# Patient Record
Sex: Male | Born: 1950 | Race: White | Hispanic: No | Smoking: Current every day smoker
Health system: Southern US, Community
[De-identification: ages and names within clinical notes are randomized; demographics above are authoritative.]

## PROBLEM LIST (undated history)

## (undated) DIAGNOSIS — Z87442 Personal history of urinary calculi: Secondary | ICD-10-CM

## (undated) DIAGNOSIS — C801 Malignant (primary) neoplasm, unspecified: Secondary | ICD-10-CM

## (undated) DIAGNOSIS — I10 Essential (primary) hypertension: Secondary | ICD-10-CM

## (undated) DIAGNOSIS — J189 Pneumonia, unspecified organism: Secondary | ICD-10-CM

## (undated) DIAGNOSIS — I739 Peripheral vascular disease, unspecified: Secondary | ICD-10-CM

## (undated) DIAGNOSIS — R0600 Dyspnea, unspecified: Secondary | ICD-10-CM

## (undated) DIAGNOSIS — M1990 Unspecified osteoarthritis, unspecified site: Secondary | ICD-10-CM

## (undated) DIAGNOSIS — S0990XA Unspecified injury of head, initial encounter: Secondary | ICD-10-CM

## (undated) DIAGNOSIS — E7800 Pure hypercholesterolemia, unspecified: Secondary | ICD-10-CM

## (undated) DIAGNOSIS — I509 Heart failure, unspecified: Secondary | ICD-10-CM

## (undated) DIAGNOSIS — S32599A Other specified fracture of unspecified pubis, initial encounter for closed fracture: Secondary | ICD-10-CM

## (undated) HISTORY — DX: Peripheral vascular disease, unspecified: I73.9

## (undated) HISTORY — DX: Unspecified osteoarthritis, unspecified site: M19.90

## (undated) HISTORY — PX: HERNIA REPAIR: SHX51

## (undated) HISTORY — PX: LAPAROSCOPIC ASSISTED VENTRAL HERNIA REPAIR: SHX6312

## (undated) HISTORY — PX: BACK SURGERY: SHX140

## (undated) HISTORY — DX: Heart failure, unspecified: I50.9

## (undated) HISTORY — PX: LUMBAR DISC SURGERY: SHX700

## (undated) HISTORY — PX: CYSTOSCOPY W/ STONE MANIPULATION: SHX1427

## (undated) HISTORY — PX: BREAST SURGERY: SHX581

## (undated) HISTORY — PX: POSTERIOR LUMBAR FUSION: SHX6036

## (undated) HISTORY — DX: Dyspnea, unspecified: R06.00

## (undated) HISTORY — PX: LUMBAR SPINE HARDWARE REMOVAL: SHX1987

---

## 1995-09-22 HISTORY — PX: COLONOSCOPY W/ POLYPECTOMY: SHX1380

## 1998-12-26 ENCOUNTER — Observation Stay (HOSPITAL_COMMUNITY): Admission: RE | Admit: 1998-12-26 | Discharge: 1998-12-27 | Payer: Self-pay | Admitting: Neurosurgery

## 1999-06-10 ENCOUNTER — Ambulatory Visit (HOSPITAL_COMMUNITY): Admission: RE | Admit: 1999-06-10 | Discharge: 1999-06-10 | Payer: Self-pay | Admitting: Neurosurgery

## 2000-02-20 DIAGNOSIS — S32599A Other specified fracture of unspecified pubis, initial encounter for closed fracture: Secondary | ICD-10-CM

## 2000-02-20 HISTORY — DX: Other specified fracture of unspecified pubis, initial encounter for closed fracture: S32.599A

## 2002-07-14 ENCOUNTER — Encounter: Payer: Self-pay | Admitting: *Deleted

## 2002-07-14 ENCOUNTER — Emergency Department (HOSPITAL_COMMUNITY): Admission: EM | Admit: 2002-07-14 | Discharge: 2002-07-14 | Payer: Self-pay | Admitting: *Deleted

## 2009-11-22 ENCOUNTER — Observation Stay (HOSPITAL_COMMUNITY): Admission: EM | Admit: 2009-11-22 | Discharge: 2009-11-23 | Payer: Self-pay | Admitting: Emergency Medicine

## 2010-10-31 ENCOUNTER — Other Ambulatory Visit (HOSPITAL_COMMUNITY): Payer: Self-pay | Admitting: Internal Medicine

## 2010-10-31 ENCOUNTER — Ambulatory Visit (HOSPITAL_COMMUNITY)
Admission: RE | Admit: 2010-10-31 | Discharge: 2010-10-31 | Disposition: A | Discharge status: Home / Self Care | Payer: Medicare Other | Source: Ambulatory Visit | Attending: Internal Medicine | Admitting: Internal Medicine

## 2010-10-31 ENCOUNTER — Encounter (HOSPITAL_COMMUNITY): Payer: Self-pay

## 2010-10-31 DIAGNOSIS — F17200 Nicotine dependence, unspecified, uncomplicated: Secondary | ICD-10-CM | POA: Insufficient documentation

## 2010-10-31 DIAGNOSIS — R05 Cough: Secondary | ICD-10-CM | POA: Insufficient documentation

## 2010-10-31 DIAGNOSIS — Z720 Tobacco use: Secondary | ICD-10-CM

## 2010-10-31 DIAGNOSIS — J4489 Other specified chronic obstructive pulmonary disease: Secondary | ICD-10-CM | POA: Insufficient documentation

## 2010-10-31 DIAGNOSIS — M545 Low back pain, unspecified: Secondary | ICD-10-CM | POA: Insufficient documentation

## 2010-10-31 DIAGNOSIS — R059 Cough, unspecified: Secondary | ICD-10-CM | POA: Insufficient documentation

## 2010-10-31 DIAGNOSIS — M549 Dorsalgia, unspecified: Secondary | ICD-10-CM

## 2010-10-31 DIAGNOSIS — J449 Chronic obstructive pulmonary disease, unspecified: Secondary | ICD-10-CM | POA: Insufficient documentation

## 2010-10-31 HISTORY — DX: Essential (primary) hypertension: I10

## 2010-12-15 LAB — CBC
HCT: 47.7 % (ref 39.0–52.0)
Hemoglobin: 16.4 g/dL (ref 13.0–17.0)
RBC: 4.9 MIL/uL (ref 4.22–5.81)
RDW: 13.9 % (ref 11.5–15.5)

## 2010-12-15 LAB — URINALYSIS, ROUTINE W REFLEX MICROSCOPIC
Bilirubin Urine: NEGATIVE
Glucose, UA: NEGATIVE mg/dL
Ketones, ur: NEGATIVE mg/dL
Specific Gravity, Urine: 1.014 (ref 1.005–1.030)
pH: 6 (ref 5.0–8.0)

## 2010-12-15 LAB — POCT CARDIAC MARKERS
CKMB, poc: <1 ng/mL — ABNORMAL LOW (ref 1.0–8.0)
Troponin i, poc: <0.05 ng/mL (ref 0.00–0.09)

## 2010-12-15 LAB — DIFFERENTIAL
Basophils Absolute: 0.1 10*3/uL (ref 0.0–0.1)
Eosinophils Relative: 5 % (ref 0–5)
Lymphocytes Relative: 34 % (ref 12–46)
Monocytes Absolute: 0.4 10*3/uL (ref 0.1–1.0)
Monocytes Relative: 6 % (ref 3–12)

## 2010-12-15 LAB — CK TOTAL AND CKMB (NOT AT ARMC)
CK, MB: 1.3 ng/mL (ref 0.3–4.0)
Relative Index: INVALID (ref 0.0–2.5)
Total CK: 99 U/L (ref 7–232)

## 2010-12-15 LAB — CARDIAC PANEL(CRET KIN+CKTOT+MB+TROPI)
CK, MB: 1.2 ng/mL (ref 0.3–4.0)
Total CK: 89 U/L (ref 7–232)

## 2010-12-15 LAB — LIPID PANEL
Cholesterol: 207 mg/dL — ABNORMAL HIGH (ref 0–200)
HDL: 29 mg/dL — ABNORMAL LOW (ref >39)
LDL Cholesterol: 114 mg/dL — ABNORMAL HIGH (ref 0–99)
Triglycerides: 320 mg/dL — ABNORMAL HIGH (ref <150)

## 2010-12-15 LAB — COMPREHENSIVE METABOLIC PANEL
ALT: 17 U/L (ref 0–53)
AST: 20 U/L (ref 0–37)
Calcium: 9.2 mg/dL (ref 8.4–10.5)
GFR calc Af Amer: >60 mL/min (ref >60)
Sodium: 139 mEq/L (ref 135–145)
Total Protein: 7.4 g/dL (ref 6.0–8.3)

## 2010-12-15 LAB — HEMOGLOBIN A1C: Hgb A1c MFr Bld: 6.4 % — ABNORMAL HIGH (ref 4.6–6.1)

## 2011-03-20 ENCOUNTER — Other Ambulatory Visit (HOSPITAL_COMMUNITY): Payer: Self-pay | Admitting: Internal Medicine

## 2011-03-20 ENCOUNTER — Ambulatory Visit (HOSPITAL_COMMUNITY)
Admission: RE | Admit: 2011-03-20 | Discharge: 2011-03-20 | Disposition: A | Discharge status: Home / Self Care | Payer: Medicare Other | Source: Ambulatory Visit | Attending: Internal Medicine | Admitting: Internal Medicine

## 2011-03-20 DIAGNOSIS — N432 Other hydrocele: Secondary | ICD-10-CM | POA: Insufficient documentation

## 2011-03-20 DIAGNOSIS — N509 Disorder of male genital organs, unspecified: Secondary | ICD-10-CM

## 2011-08-19 ENCOUNTER — Other Ambulatory Visit (HOSPITAL_COMMUNITY): Payer: Self-pay | Admitting: Internal Medicine

## 2011-08-19 DIAGNOSIS — R109 Unspecified abdominal pain: Secondary | ICD-10-CM

## 2011-08-20 ENCOUNTER — Ambulatory Visit (HOSPITAL_COMMUNITY)
Admission: RE | Admit: 2011-08-20 | Discharge: 2011-08-20 | Disposition: A | Discharge status: Home / Self Care | Payer: Medicare Other | Source: Ambulatory Visit | Attending: Internal Medicine | Admitting: Internal Medicine

## 2011-08-20 ENCOUNTER — Encounter (HOSPITAL_COMMUNITY): Payer: Self-pay

## 2011-08-20 DIAGNOSIS — I714 Abdominal aortic aneurysm, without rupture, unspecified: Secondary | ICD-10-CM | POA: Insufficient documentation

## 2011-08-20 DIAGNOSIS — K5730 Diverticulosis of large intestine without perforation or abscess without bleeding: Secondary | ICD-10-CM | POA: Insufficient documentation

## 2011-08-20 DIAGNOSIS — N200 Calculus of kidney: Secondary | ICD-10-CM | POA: Insufficient documentation

## 2011-08-20 DIAGNOSIS — R109 Unspecified abdominal pain: Secondary | ICD-10-CM | POA: Insufficient documentation

## 2011-08-20 MED ORDER — IOHEXOL 300 MG/ML  SOLN
100.0000 mL | Freq: Once | INTRAMUSCULAR | Status: AC | PRN
  Administered 2011-08-20: 100 mL via INTRAVENOUS

## 2011-09-17 ENCOUNTER — Encounter: Payer: Self-pay | Admitting: Cardiovascular Disease

## 2011-09-17 ENCOUNTER — Ambulatory Visit (INDEPENDENT_AMBULATORY_CARE_PROVIDER_SITE_OTHER): Payer: Medicare Other | Admitting: Cardiovascular Disease

## 2011-09-17 DIAGNOSIS — I714 Abdominal aortic aneurysm, without rupture, unspecified: Secondary | ICD-10-CM

## 2011-09-17 DIAGNOSIS — R079 Chest pain, unspecified: Secondary | ICD-10-CM

## 2011-09-17 NOTE — Patient Instructions (Signed)
Your physician has requested that you have a lexiscan myoview. For further information please visit [email or web address] Please follow instruction sheet, as given.  Your physician wants you to follow-up in: 6 MONTHS.  You will receive a reminder letter in the mail two months in advance. If you don't receive a letter, please call our office to schedule the follow-up appointment.  Your physician has requested that you have an abdominal aorta duplex in 6 MONTHS.  During this test, an ultrasound is used to evaluate the aorta. Allow 30 minutes for this exam. Do not eat after midnight the day before and avoid carbonated beverages  Your physician recommends that you continue on your current medications as directed. Please refer to the Current Medication list given to you today.

## 2011-09-23 ENCOUNTER — Ambulatory Visit (HOSPITAL_COMMUNITY): Payer: Medicare Other | Attending: Cardiology | Admitting: Radiology

## 2011-09-23 DIAGNOSIS — R0609 Other forms of dyspnea: Secondary | ICD-10-CM | POA: Diagnosis not present

## 2011-09-23 DIAGNOSIS — R079 Chest pain, unspecified: Secondary | ICD-10-CM | POA: Diagnosis not present

## 2011-09-23 DIAGNOSIS — R002 Palpitations: Secondary | ICD-10-CM | POA: Diagnosis not present

## 2011-09-23 DIAGNOSIS — I714 Abdominal aortic aneurysm, without rupture, unspecified: Secondary | ICD-10-CM | POA: Insufficient documentation

## 2011-09-23 DIAGNOSIS — E785 Hyperlipidemia, unspecified: Secondary | ICD-10-CM | POA: Insufficient documentation

## 2011-09-23 DIAGNOSIS — R5381 Other malaise: Secondary | ICD-10-CM | POA: Insufficient documentation

## 2011-09-23 DIAGNOSIS — I10 Essential (primary) hypertension: Secondary | ICD-10-CM | POA: Insufficient documentation

## 2011-09-23 DIAGNOSIS — R0989 Other specified symptoms and signs involving the circulatory and respiratory systems: Secondary | ICD-10-CM | POA: Insufficient documentation

## 2011-09-23 DIAGNOSIS — F17200 Nicotine dependence, unspecified, uncomplicated: Secondary | ICD-10-CM | POA: Diagnosis not present

## 2011-09-23 DIAGNOSIS — Z8249 Family history of ischemic heart disease and other diseases of the circulatory system: Secondary | ICD-10-CM | POA: Diagnosis not present

## 2011-09-23 DIAGNOSIS — R0602 Shortness of breath: Secondary | ICD-10-CM | POA: Insufficient documentation

## 2011-09-23 MED ORDER — REGADENOSON 0.4 MG/5ML IV SOLN
0.4000 mg | Freq: Once | INTRAVENOUS | Status: AC
  Administered 2011-09-23: 0.4 mg via INTRAVENOUS

## 2011-09-23 MED ORDER — TECHNETIUM TC 99M TETROFOSMIN IV KIT
10.0000 | PACK | Freq: Once | INTRAVENOUS | Status: AC | PRN
  Administered 2011-09-23: 10 via INTRAVENOUS

## 2011-09-23 MED ORDER — TECHNETIUM TC 99M TETROFOSMIN IV KIT
30.0000 | PACK | Freq: Once | INTRAVENOUS | Status: AC | PRN
  Administered 2011-09-23: 30 via INTRAVENOUS

## 2011-09-23 NOTE — Progress Notes (Signed)
[facility] SITE 3 NUCLEAR MED [address] [place] [address] [phone]  Cardiology Nuclear Med Study  John Choi is a 61 y.o. male [id number] 05-Jun-1951   Nuclear Med Background Indication for Stress Test:  Evaluation for Ischemia History:  No previous documented CAD and 08/19/11 CT: ABD/Pelvis AAA 3.9 cm Cardiac Risk Factors: Family History - CAD, Hypertension, Lipids and Smoker  Symptoms:  Chest Pain, DOE, Fatigue, Palpitations and SOB   Nuclear Pre-Procedure Caffeine/Decaff Intake:  None NPO After: 6 pm   Lungs:  clear IV 0.9% NS with Angio Cath:  20g  IV Site: R Hand  IV Started by:  Bonnita Levan, RN  Chest Size (in):  44 Cup Size: n/a  Height: 6' (1.829 m)  Weight:  176 lb (79.833 kg)  BMI:  Body mass index is 23.87 kg/(m^2). Tech Comments:  N/A    Nuclear Med Study 1 or 2 day study: 1 day  Stress Test Type:  Lexiscan  Reading MD: Olga Millers, MD  Order Authorizing Provider:  Tonny Bollman, MD  Resting Radionuclide: Technetium 68m Tetrofosmin  Resting Radionuclide Dose: 11.0 mCi   Stress Radionuclide:  Technetium 32m Tetrofosmin  Stress Radionuclide Dose: 33.0 mCi           Stress Protocol Rest HR: 70 Stress HR: 110  Rest BP: 124/81 Stress BP: 131/81  Exercise Time (min): n/a METS: n/a   Predicted Max HR: 160 bpm % Max HR: 68.75 bpm Rate Pressure Product: 21308   Dose of Adenosine (mg):  n/a Dose of Lexiscan: 0.4 mg  Dose of Atropine (mg): n/a Dose of Dobutamine: n/a mcg/kg/min (at max HR)  Stress Test Technologist: Milana Na, EMT-P  Nuclear Technologist:  Domenic Polite, CNMT     Rest Procedure:  Myocardial perfusion imaging was performed at rest 45 minutes following the intravenous administration of Technetium 67m Tetrofosmin. Rest ECG: SR with PJCS  Stress Procedure:  The patient received IV Lexiscan 0.4 mg over 15-seconds.  Technetium 37m Tetrofosmin injected at 30-seconds.  There were no significant changes,  sob, lt. Headed, chest tightness, and occ pjcs with Lexiscan.  Quantitative spect images were obtained after a 45 minute delay. Stress ECG: No ST changes  QPS Raw Data Images:  Acquisition technically good; mild LVE. Stress Images:  There is decreased uptake in the apex. Rest Images:  There is decreased uptake in the apex. Subtraction (SDS):  No evidence of ischemia. Transient Ischemic Dilatation (Normal <1.22):  0.94 Lung/Heart Ratio (Normal <0.45):  0.30  Quantitative Gated Spect Images QGS EDV:  134 ml QGS ESV:  70 ml QGS cine images:  Mild global hypokinesis and LVE. QGS EF: 48%  Impression Exercise Capacity:  Lexiscan with no exercise. BP Response:  Normal blood pressure response. Clinical Symptoms:  There is chest pain ECG Impression:  No significant ST segment change suggestive of ischemia. Comparison with Prior Nuclear Study: No images to compare  Overall Impression:  Low risk stress nuclear study with a small fixed apical defect suggestive of thinning; no ischemia; mild LVE and global hypokinesis.    Olga Millers

## 2011-10-01 ENCOUNTER — Telehealth: Payer: Self-pay | Admitting: Cardiovascular Disease

## 2011-10-01 NOTE — Telephone Encounter (Signed)
New Msg: Pt calling wanting to know results of pt stress test. Please return pt call to discuss further.  

## 2011-10-01 NOTE — Telephone Encounter (Signed)
Results given to pt.  Echo ordered.  Diagnostic imaging form filled out and mailed to pt.  Message sent to [facility] to order echo.

## 2011-10-01 NOTE — Telephone Encounter (Signed)
Left a message to call back.

## 2011-10-04 DIAGNOSIS — I714 Abdominal aortic aneurysm, without rupture: Secondary | ICD-10-CM | POA: Insufficient documentation

## 2011-10-04 DIAGNOSIS — R079 Chest pain, unspecified: Secondary | ICD-10-CM | POA: Insufficient documentation

## 2011-10-04 NOTE — Assessment & Plan Note (Signed)
The patient has predominantly atypical chest pain, but has multiple cardiac risk factors with hypertension, hyperlipidemia, ongoing tobacco use, and known aortic aneurysm. Recommend Lexiscan Myoview stress test to rule out significant ischemia

## 2011-10-04 NOTE — Assessment & Plan Note (Signed)
Likely incidental diagnosis in the setting of unrelated abdominal pain. The patient will need close surveillance and aggressive risk reduction measures. He had a long discussion about the importance of tobacco cessation. Will need to watch his blood pressure closely and have a low threshold to start a beta blocker to reduce aneurysmal wall tension.  Recommend followup abdominal aortic duplex scan in 6 months.

## 2011-10-04 NOTE — Progress Notes (Signed)
HPI:  61 year-old gentleman presenting for initial evaluation of abdominal aortic aneurysm. The patient presented with abdominal pain and was incidentally noted to have an approximate 4 cm diameter infrarenal abdominal aortic aneurysm. There was no evidence for aneurysmal leak or rub sure. Other findings included nonobstructing left nephrolithiasis, diverticulosis without evidence of diverticulitis, and a possible noncalcified gallstone without evidence of cholecystitis.  The patient is known to me as I follow his wife for coronary artery disease. He describes chest pain that occurred earlier this year when he felt a left-sided "knifelike pain" with exertion. He predominately noted this when he was going up and down stairs. This pain has subsequently resolved and has not recurred. He has complained of an intermittent pain in the right groin and right lower abdomen area and this led to a CT scan with findings noted above. His pain is worse with certain movements and also occurs after he does strenuous activity. He has not had nausea, vomiting, diarrhea, or constipation. He has mild chronic dyspnea and he is a long-standing smoker. The patient does have a family history of coronary disease but there is no premature CAD. His father died of myocardial infarction at age 81 his mother had coronary disease in her 41s and she is still living. There is no family history of aneurysm.  The patient denies palpitations, orthopnea, PND, lightheadedness, or syncope.    Outpatient Encounter Prescriptions as of 09/17/2011  Medication Sig Dispense Refill  . Albuterol (VENTOLIN IN) Inhale into the lungs as needed.        Marland Kitchen amLODipine (NORVASC) 10 MG tablet Take 10 mg by mouth daily.        . simvastatin (ZOCOR) 20 MG tablet Take 20 mg by mouth at bedtime.          Review of patient's allergies indicates no known allergies.  Past Medical History  Diagnosis Date  . Hypertension   . Asthma   . Kidney stones      Past Surgical History  Procedure Date  . Back surgery     History   Social History  . Marital Status: Divorced    Spouse Name: N/A    Number of Children: N/A  . Years of Education: N/A   Occupational History  . Not on file.   Social History Main Topics  . Smoking status: Current Everyday Smoker  . Smokeless tobacco: Not on file  . Alcohol Use: Yes  . Drug Use:   . Sexually Active:    Other Topics Concern  . Not on file   Social History Narrative  . No narrative on file    Family History  Problem Relation Age of Onset  . Heart disease Mother   . Heart attack Father   . Stroke Father     ROS: General: no fevers/chills/night sweats Eyes: no blurry vision, diplopia, or amaurosis ENT: no sore throat or hearing loss Resp: no cough, wheezing, or hemoptysis CV: no edema or palpitations GI: no abdominal pain, nausea, vomiting, diarrhea, or constipation GU: no dysuria, frequency, or hematuria Skin: no rash Neuro: no headache, numbness, tingling, or weakness of extremities Musculoskeletal: no joint pain or swelling.  Positive for left leg pain with ambulation.  Positive for chronic low back pain. Heme: no bleeding, DVT, or easy bruising Endo: no polydipsia or polyuria  BP 134/68  Pulse 90  Ht 6' (1.829 m)  Wt 80.74 kg (178 lb)  BMI 24.14 kg/m2  PHYSICAL EXAM: Pt is alert and oriented, WD,  WN, in no distress. HEENT: normal Neck: JVP normal. Carotid upstrokes normal without bruits. No thyromegaly. Lungs: equal expansion, clear bilaterally CV: Apex is discrete and nondisplaced, RRR without murmur or gallop Abd: soft, +BS, no bruit, no hepatosplenomegaly.  There is tenderness over the right lower abdomen. There is no evidence of hernia on my exam. Back: no CVA tenderness Ext: no C/C/E        Femoral pulses 2+= without bruits        DP/PT pulses intact and = Skin: warm and dry without rash Neuro: CNII-XII intact             Strength intact =  bilaterally  EKG:  Normal sinus rhythm with sinus arrhythmia and 90 beats per minute, minimal voltage criteria for left ventricular hypertrophy maybe normal variant.  ASSESSMENT AND PLAN:

## 2011-10-05 ENCOUNTER — Other Ambulatory Visit (HOSPITAL_COMMUNITY): Payer: Self-pay | Admitting: Internal Medicine

## 2011-10-05 ENCOUNTER — Ambulatory Visit (HOSPITAL_COMMUNITY)
Admission: RE | Admit: 2011-10-05 | Discharge: 2011-10-05 | Disposition: A | Discharge status: Home / Self Care | Payer: Medicare Other | Source: Ambulatory Visit | Attending: Internal Medicine | Admitting: Internal Medicine

## 2011-10-05 DIAGNOSIS — Z6825 Body mass index (BMI) 25.0-25.9, adult: Secondary | ICD-10-CM | POA: Diagnosis not present

## 2011-10-05 DIAGNOSIS — E785 Hyperlipidemia, unspecified: Secondary | ICD-10-CM | POA: Diagnosis not present

## 2011-10-05 DIAGNOSIS — R0602 Shortness of breath: Secondary | ICD-10-CM | POA: Insufficient documentation

## 2011-10-05 DIAGNOSIS — J45909 Unspecified asthma, uncomplicated: Secondary | ICD-10-CM | POA: Diagnosis not present

## 2011-10-05 DIAGNOSIS — I10 Essential (primary) hypertension: Secondary | ICD-10-CM | POA: Diagnosis not present

## 2011-10-05 DIAGNOSIS — J9819 Other pulmonary collapse: Secondary | ICD-10-CM | POA: Diagnosis not present

## 2011-10-05 DIAGNOSIS — R079 Chest pain, unspecified: Secondary | ICD-10-CM | POA: Diagnosis not present

## 2011-10-05 DIAGNOSIS — J984 Other disorders of lung: Secondary | ICD-10-CM | POA: Diagnosis not present

## 2011-10-06 ENCOUNTER — Other Ambulatory Visit (HOSPITAL_COMMUNITY): Payer: Self-pay | Admitting: Internal Medicine

## 2011-10-06 ENCOUNTER — Telehealth: Payer: Self-pay

## 2011-10-06 ENCOUNTER — Ambulatory Visit (HOSPITAL_COMMUNITY)
Admission: RE | Admit: 2011-10-06 | Discharge: 2011-10-06 | Disposition: A | Discharge status: Home / Self Care | Payer: Medicare Other | Source: Ambulatory Visit | Attending: Internal Medicine | Admitting: Internal Medicine

## 2011-10-06 DIAGNOSIS — M531 Cervicobrachial syndrome: Secondary | ICD-10-CM

## 2011-10-06 DIAGNOSIS — J9819 Other pulmonary collapse: Secondary | ICD-10-CM | POA: Diagnosis not present

## 2011-10-06 DIAGNOSIS — R079 Chest pain, unspecified: Secondary | ICD-10-CM | POA: Insufficient documentation

## 2011-10-06 MED ORDER — IOHEXOL 350 MG/ML SOLN
100.0000 mL | Freq: Once | INTRAVENOUS | Status: AC | PRN
  Administered 2011-10-06: 100 mL via INTRAVENOUS

## 2011-10-06 NOTE — Telephone Encounter (Signed)
Pt was returning triage nurse's call. I told him that she had left for lunch and will be back in 30 minutes. She would return his call then.

## 2011-10-06 NOTE — Telephone Encounter (Signed)
Called pt. He said he is not ready to schedule yet. He is having some problems with his arteries and having more tests made. He will call when he is ready.

## 2011-10-06 NOTE — Telephone Encounter (Signed)
LMOM for pt to call. 

## 2011-10-07 ENCOUNTER — Other Ambulatory Visit (HOSPITAL_COMMUNITY): Payer: Medicare Other | Admitting: Radiology

## 2011-10-07 NOTE — Telephone Encounter (Signed)
Pt called back. He is scheduled for OV with Tana Coast, PA on 10/14/2011 @ 8:00 AM.

## 2011-10-08 ENCOUNTER — Ambulatory Visit (HOSPITAL_COMMUNITY): Payer: Medicare Other

## 2011-10-12 ENCOUNTER — Ambulatory Visit (HOSPITAL_COMMUNITY)
Admission: RE | Admit: 2011-10-12 | Discharge: 2011-10-12 | Disposition: A | Discharge status: Home / Self Care | Payer: Medicare Other | Source: Ambulatory Visit | Attending: Internal Medicine | Admitting: Internal Medicine

## 2011-10-12 ENCOUNTER — Telehealth: Payer: Self-pay

## 2011-10-12 DIAGNOSIS — M531 Cervicobrachial syndrome: Secondary | ICD-10-CM

## 2011-10-12 NOTE — Telephone Encounter (Signed)
Pt called and cancelled appt with Tana Coast, PA on 10/14/2011. York Spaniel he is having a lot of money problems now, and issue with his heart. No GI problems. York Spaniel he will call back when he is ready.

## 2011-10-14 ENCOUNTER — Ambulatory Visit: Payer: Medicare Other | Admitting: Gastroenterology

## 2011-10-19 ENCOUNTER — Ambulatory Visit (HOSPITAL_COMMUNITY): Payer: Medicare Other | Attending: Cardiology | Admitting: Radiology

## 2011-10-19 DIAGNOSIS — Z8249 Family history of ischemic heart disease and other diseases of the circulatory system: Secondary | ICD-10-CM | POA: Insufficient documentation

## 2011-10-19 DIAGNOSIS — R002 Palpitations: Secondary | ICD-10-CM | POA: Diagnosis not present

## 2011-10-19 DIAGNOSIS — F17200 Nicotine dependence, unspecified, uncomplicated: Secondary | ICD-10-CM | POA: Insufficient documentation

## 2011-10-19 DIAGNOSIS — I714 Abdominal aortic aneurysm, without rupture, unspecified: Secondary | ICD-10-CM | POA: Insufficient documentation

## 2011-10-19 DIAGNOSIS — R5381 Other malaise: Secondary | ICD-10-CM | POA: Insufficient documentation

## 2011-10-19 DIAGNOSIS — R072 Precordial pain: Secondary | ICD-10-CM

## 2011-10-19 DIAGNOSIS — R079 Chest pain, unspecified: Secondary | ICD-10-CM | POA: Diagnosis not present

## 2011-10-19 DIAGNOSIS — E785 Hyperlipidemia, unspecified: Secondary | ICD-10-CM | POA: Insufficient documentation

## 2011-10-19 DIAGNOSIS — I10 Essential (primary) hypertension: Secondary | ICD-10-CM | POA: Diagnosis not present

## 2011-10-22 ENCOUNTER — Telehealth: Payer: Self-pay

## 2011-10-22 NOTE — Telephone Encounter (Signed)
I spoke with the pt and made him aware of ECHO results.  

## 2012-12-16 DIAGNOSIS — J310 Chronic rhinitis: Secondary | ICD-10-CM | POA: Diagnosis not present

## 2012-12-16 DIAGNOSIS — J984 Other disorders of lung: Secondary | ICD-10-CM | POA: Diagnosis not present

## 2012-12-16 DIAGNOSIS — IMO0002 Reserved for concepts with insufficient information to code with codable children: Secondary | ICD-10-CM | POA: Diagnosis not present

## 2013-02-02 DIAGNOSIS — Z719 Counseling, unspecified: Secondary | ICD-10-CM | POA: Diagnosis not present

## 2013-02-02 DIAGNOSIS — R51 Headache: Secondary | ICD-10-CM | POA: Diagnosis not present

## 2013-02-02 DIAGNOSIS — J019 Acute sinusitis, unspecified: Secondary | ICD-10-CM | POA: Diagnosis not present

## 2013-02-02 DIAGNOSIS — IMO0002 Reserved for concepts with insufficient information to code with codable children: Secondary | ICD-10-CM | POA: Diagnosis not present

## 2013-08-28 ENCOUNTER — Emergency Department (HOSPITAL_COMMUNITY): Payer: Medicare Other

## 2013-08-28 ENCOUNTER — Emergency Department (HOSPITAL_COMMUNITY)
Admission: EM | Admit: 2013-08-28 | Discharge: 2013-08-28 | Disposition: A | Discharge status: Home / Self Care | Payer: Medicare Other | Attending: Emergency Medicine | Admitting: Emergency Medicine

## 2013-08-28 ENCOUNTER — Encounter (HOSPITAL_COMMUNITY): Payer: Self-pay | Admitting: Emergency Medicine

## 2013-08-28 DIAGNOSIS — R110 Nausea: Secondary | ICD-10-CM | POA: Insufficient documentation

## 2013-08-28 DIAGNOSIS — Z87442 Personal history of urinary calculi: Secondary | ICD-10-CM | POA: Diagnosis not present

## 2013-08-28 DIAGNOSIS — M549 Dorsalgia, unspecified: Secondary | ICD-10-CM | POA: Diagnosis not present

## 2013-08-28 DIAGNOSIS — Z7982 Long term (current) use of aspirin: Secondary | ICD-10-CM | POA: Insufficient documentation

## 2013-08-28 DIAGNOSIS — S0990XA Unspecified injury of head, initial encounter: Secondary | ICD-10-CM | POA: Diagnosis not present

## 2013-08-28 DIAGNOSIS — Z87820 Personal history of traumatic brain injury: Secondary | ICD-10-CM | POA: Insufficient documentation

## 2013-08-28 DIAGNOSIS — J45909 Unspecified asthma, uncomplicated: Secondary | ICD-10-CM | POA: Diagnosis not present

## 2013-08-28 DIAGNOSIS — Z79899 Other long term (current) drug therapy: Secondary | ICD-10-CM | POA: Diagnosis not present

## 2013-08-28 DIAGNOSIS — F17200 Nicotine dependence, unspecified, uncomplicated: Secondary | ICD-10-CM | POA: Diagnosis not present

## 2013-08-28 DIAGNOSIS — R42 Dizziness and giddiness: Secondary | ICD-10-CM | POA: Diagnosis not present

## 2013-08-28 DIAGNOSIS — I10 Essential (primary) hypertension: Secondary | ICD-10-CM | POA: Diagnosis not present

## 2013-08-28 HISTORY — DX: Unspecified injury of head, initial encounter: S09.90XA

## 2013-08-28 LAB — COMPREHENSIVE METABOLIC PANEL
ALT: 14 U/L (ref 0–53)
Alkaline Phosphatase: 111 U/L (ref 39–117)
CO2: 24 mEq/L (ref 19–32)
Calcium: 9.8 mg/dL (ref 8.4–10.5)
GFR calc Af Amer: 69 mL/min — ABNORMAL LOW (ref >90)
GFR calc non Af Amer: 59 mL/min — ABNORMAL LOW (ref >90)
Glucose, Bld: 133 mg/dL — ABNORMAL HIGH (ref 70–99)
Sodium: 142 mEq/L (ref 135–145)

## 2013-08-28 LAB — CBC WITH DIFFERENTIAL/PLATELET
Eosinophils Relative: 5 % (ref 0–5)
HCT: 45.3 % (ref 39.0–52.0)
Hemoglobin: 15.7 g/dL (ref 13.0–17.0)
Lymphocytes Relative: 29 % (ref 12–46)
Lymphs Abs: 2.3 10*3/uL (ref 0.7–4.0)
MCV: 95 fL (ref 78.0–100.0)
Monocytes Relative: 7 % (ref 3–12)
Platelets: 252 10*3/uL (ref 150–400)
RBC: 4.77 MIL/uL (ref 4.22–5.81)
WBC: 8.1 10*3/uL (ref 4.0–10.5)

## 2013-08-28 MED ORDER — SODIUM CHLORIDE 0.9 % IV SOLN
INTRAVENOUS | Status: DC
  Administered 2013-08-28: 10:00:00 via INTRAVENOUS

## 2013-08-28 MED ORDER — PROMETHAZINE HCL 25 MG PO TABS: 25.0000 mg | ORAL_TABLET | Freq: Four times a day (QID) | ORAL | Status: DC | PRN

## 2013-08-28 MED ORDER — ONDANSETRON HCL 4 MG/2ML IJ SOLN
4.0000 mg | Freq: Once | INTRAMUSCULAR | Status: AC
Start: 2013-08-28 — End: 2013-08-28
  Administered 2013-08-28: 4 mg via INTRAVENOUS
  Filled 2013-08-28: qty 2

## 2013-08-28 MED ORDER — MECLIZINE HCL 12.5 MG PO TABS
25.0000 mg | ORAL_TABLET | Freq: Once | ORAL | Status: AC
  Administered 2013-08-28: 25 mg via ORAL
  Filled 2013-08-28: qty 2

## 2013-08-28 MED ORDER — SODIUM CHLORIDE 0.9 % IV BOLUS (SEPSIS)
250.0000 mL | Freq: Once | INTRAVENOUS | Status: AC
  Administered 2013-08-28: 250 mL via INTRAVENOUS

## 2013-08-28 MED ORDER — MECLIZINE HCL 25 MG PO TABS: 25.0000 mg | ORAL_TABLET | Freq: Four times a day (QID) | ORAL | Status: DC

## 2013-08-28 NOTE — ED Provider Notes (Signed)
CSN: [id number]     Arrival date & time 08/28/13  4098 History   First MD Initiated Contact with Patient 08/28/13 0909     This chart was scribed for Shelda Jakes, MD by Ellin Mayhew, ED Scribe. This patient was seen in room APA14/APA14 and the patient's care was started at 9:31 AM.  Chief Complaint  Patient presents with  . Dizziness   Patient is a 62 y.o. male presenting with general illness. The history is provided by the patient. No language interpreter was used.  Illness Quality:  Room Spinning Onset quality:  Sudden Duration:  12 hours Timing:  Constant Progression:  Unchanged Chronicity:  New Worsened by:  Movement Associated symptoms: nausea   Associated symptoms: no abdominal pain, no chest pain, no congestion, no cough, no diarrhea, no fever, no headaches, no rash, no shortness of breath, no sore throat and no vomiting     HPI Comments: John Choi is a 62 y.o. male who presents to the Emergency Department complaining of dizziness, described as room spinning, with associated nausea that began at 9:00pm last night. Dizziness is worsened by sudden movement of head, especially to the right. Pt has had some trouble ambulating due to diziness. Pt denies any prior episodes. Pt denies any recent illness, tinnitus or any vomiting. 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   Past Medical History  Diagnosis Date  . Hypertension   . Asthma   . Kidney stones   . Head trauma    Past Surgical History  Procedure Laterality Date  . Back surgery    . Breast surgery     Family History  Problem Relation Age of Onset  . Heart disease Mother   . Heart attack Father   . Stroke Father    History  Substance Use Topics  . Smoking status: Current Every Day Smoker -- 1.50 packs/day  . Smokeless tobacco: Not on file  . Alcohol Use: Yes     Comment: 2 beers a month    Review of Systems  Constitutional: Negative for  fever and chills.  HENT: Negative for congestion, sore throat and tinnitus.   Eyes: Negative for visual disturbance.  Respiratory: Negative for cough and shortness of breath.   Cardiovascular: Negative for chest pain and leg swelling.  Gastrointestinal: Positive for nausea. Negative for vomiting, abdominal pain and diarrhea.  Genitourinary: Negative for dysuria and hematuria.  Musculoskeletal: Positive for back pain. Negative for joint swelling and neck pain.  Skin: Negative for rash.  Neurological: Positive for dizziness. Negative for headaches.  Hematological: Does not bruise/bleed easily.  Psychiatric/Behavioral: Negative for confusion.    Allergies  Review of patient's allergies indicates no known allergies.  Home Medications   Current Outpatient Rx  Name  Route  Sig  Dispense  Refill  . albuterol (PROVENTIL HFA;VENTOLIN HFA) 108 (90 BASE) MCG/ACT inhaler   Inhalation   Inhale 2 puffs into the lungs every 6 (six) hours as needed for wheezing or shortness of breath.         Marland Kitchen amLODipine (NORVASC) 10 MG tablet   Oral   Take 10 mg by mouth daily.           Marland Kitchen aspirin EC 81 MG tablet   Oral   Take 81 mg by mouth daily.         . simvastatin (ZOCOR) 20 MG tablet   Oral   Take  20 mg by mouth at bedtime.           . meclizine (ANTIVERT) 25 MG tablet   Oral   Take 1 tablet (25 mg total) by mouth 4 (four) times daily.   28 tablet   0   . promethazine (PHENERGAN) 25 MG tablet   Oral   Take 1 tablet (25 mg total) by mouth every 6 (six) hours as needed for nausea or vomiting.   12 tablet   1    Triage Vitals: BP 121/73  Pulse 73  Resp 12  SpO2 97%  Physical Exam  Nursing note and vitals reviewed. Constitutional: He is oriented to person, place, and time. He appears well-developed and well-nourished. No distress.  HENT:  Head: Normocephalic and atraumatic.  Mouth/Throat: Oropharynx is clear and moist.  Eyes: Conjunctivae and EOM are normal. Pupils are equal,  round, and reactive to light.  Sclera are clear  Neck: Neck supple. No tracheal deviation present.  Cardiovascular: Normal rate and regular rhythm.   No murmur heard. Pulses:      Dorsalis pedis pulses are 2+ on the right side, and 2+ on the left side.  Pulmonary/Chest: Effort normal and breath sounds normal. No respiratory distress. He has no wheezes.  Abdominal: Soft. Bowel sounds are normal. He exhibits no distension. There is no tenderness.  Musculoskeletal: Normal range of motion. He exhibits no edema (no ankle swelling).  Lymphadenopathy:    He has no cervical adenopathy.  Neurological: He is alert and oriented to person, place, and time. No cranial nerve deficit.  Pt able to move both sets of fingers and toes. Negative pronator drift.   Skin: Skin is warm and dry. No rash noted.  Psychiatric: He has a normal mood and affect. His behavior is normal.    ED Course  Procedures (including critical care time)  DIAGNOSTIC STUDIES:   COORDINATION OF CARE: 9:36 AM- Discussed possibility of a virally induced vertigo, likely from one ear. Will give antivert for current symptoms of vertigo and will order a CT scan of the head. Discussed treatment plan with patient at bedside and patient agrees.  Labs Review Labs Reviewed  COMPREHENSIVE METABOLIC PANEL - Abnormal; Notable for the following:    Glucose, Bld 133 (*)    GFR calc non Af Amer 59 (*)    GFR calc Af Amer 69 (*)    All other components within normal limits  GLUCOSE, CAPILLARY - Abnormal; Notable for the following:    Glucose-Capillary 105 (*)    All other components within normal limits  CBC WITH DIFFERENTIAL  TROPONIN I   Results for orders placed during the hospital encounter of 08/28/13  CBC WITH DIFFERENTIAL      Result Value Range   WBC 8.1  4.0 - 10.5 K/uL   RBC 4.77  4.22 - 5.81 MIL/uL   Hemoglobin 15.7  13.0 - 17.0 g/dL   HCT 21.3  08.6 - 57.8 %   MCV 95.0  78.0 - 100.0 fL   MCH 32.9  26.0 - 34.0 pg   MCHC  34.7  30.0 - 36.0 g/dL   RDW 46.9  62.9 - 52.8 %   Platelets 252  150 - 400 K/uL   Neutrophils Relative % 58  43 - 77 %   Neutro Abs 4.7  1.7 - 7.7 K/uL   Lymphocytes Relative 29  12 - 46 %   Lymphs Abs 2.3  0.7 - 4.0 K/uL   Monocytes Relative 7  3 - 12 %   Monocytes Absolute 0.6  0.1 - 1.0 K/uL   Eosinophils Relative 5  0 - 5 %   Eosinophils Absolute 0.4  0.0 - 0.7 K/uL   Basophils Relative 1  0 - 1 %   Basophils Absolute 0.1  0.0 - 0.1 K/uL  COMPREHENSIVE METABOLIC PANEL      Result Value Range   Sodium 142  135 - 145 mEq/L   Potassium 3.8  3.5 - 5.1 mEq/L   Chloride 105  96 - 112 mEq/L   CO2 24  19 - 32 mEq/L   Glucose, Bld 133 (*) 70 - 99 mg/dL   BUN 13  6 - 23 mg/dL   Creatinine, Ser 4.78  0.50 - 1.35 mg/dL   Calcium 9.8  8.4 - 29.5 mg/dL   Total Protein 7.7  6.0 - 8.3 g/dL   Albumin 3.8  3.5 - 5.2 g/dL   AST 19  0 - 37 U/L   ALT 14  0 - 53 U/L   Alkaline Phosphatase 111  39 - 117 U/L   Total Bilirubin 0.4  0.3 - 1.2 mg/dL   GFR calc non Af Amer 59 (*) >90 mL/min   GFR calc Af Amer 69 (*) >90 mL/min  TROPONIN I      Result Value Range   Troponin I <0.30  <0.30 ng/mL  GLUCOSE, CAPILLARY      Result Value Range   Glucose-Capillary 105 (*) 70 - 99 mg/dL    Imaging Review Dg Chest 2 View  08/28/2013   CLINICAL DATA:  Dizziness.  EXAM: CHEST  2 VIEW  COMPARISON:  10/05/2011  FINDINGS: The cardiomediastinal silhouette is within normal limits. Linear atelectasis is again seen in the lingula. The lungs are otherwise well inflated and clear. There is no evidence of pleural effusion or pneumothorax. No acute osseous abnormality is identified.  IMPRESSION: No evidence of active cardiopulmonary disease.   Electronically Signed   By: Sebastian Ache   On: 08/28/2013 10:19   Ct Head Wo Contrast  08/28/2013   CLINICAL DATA:  Dizziness.  EXAM: CT HEAD WITHOUT CONTRAST  TECHNIQUE: Contiguous axial images were obtained from the base of the skull through the vertex without intravenous  contrast.  COMPARISON:  None.  FINDINGS: Bony calvarium appears intact. Mild chronic ischemic white matter disease is noted. No mass effect or midline shift is noted. Ventricular size is within normal limits. There is no evidence of mass lesion, hemorrhage or acute infarction.  IMPRESSION: Chronic ischemic white matter disease. No acute intracranial abnormality seen.   Electronically Signed   By: Roque Lias M.D.   On: 08/28/2013 10:20   Mr Brain Wo Contrast  08/28/2013   CLINICAL DATA:  Weakness and dizziness.  Previous head injury.  EXAM: MRI HEAD WITHOUT CONTRAST  TECHNIQUE: Multiplanar, multiecho pulse sequences of the brain and surrounding structures were obtained without intravenous contrast.  COMPARISON:  Head CT 08/28/2013.  FINDINGS: Diffusion imaging does not show any acute or subacute infarction.  Brainstem and cerebellum are normal. The cerebral hemispheres show numerous foci of abnormal T2 and FLAIR signal within the deep white matter with some involvement of the subcortical white matter. Pattern could relate to chronic microvascular disease, but demyelinating disease can present with this appearance. No cortical or large vessel territory insult. No evidence of acute or old intracranial hemorrhage. No mass lesion, hydrocephalus or extra-axial collection. No pituitary mass. No fluid in the sinuses, middle ears or mastoids.  IMPRESSION: No acute insult. Extensive chronic appearing white matter disease affecting the cerebral hemispheres. The pattern could be due to small vessel ischemic changes, but many of the features can also be seen with demyelinating disease.   Electronically Signed   By: Paulina Fusi M.D.   On: 08/28/2013 13:13            Date: 08/28/2013  Rate: 84  Rhythm: normal sinus rhythm  QRS Axis: left  Intervals: normal  ST/T Wave abnormalities: nonspecific ST/T changes  Conduction Disutrbances:right bundle branch block and left anterior fascicular block  Narrative  Interpretation:   Old EKG Reviewed: none available    MDM   1. Vertigo    Extensive workup for the vertigo which seems to be positional. Patient's labs i sniffing and findings. Head CT was negative MRI of the brain does not show any evidence of stroke there is some question of possible demyelinating disease. Patient will be referred to neurology for additional workup. Patient will be continued on Antivert and Phenergan. Head CT without any acute findings MRI without any acute findings. Chest x-ray negative for pneumonia pulmonary edema pleural effusion pneumothorax.  I personally performed the services described in this documentation, which was scribed in my presence. The recorded information has been reviewed and is accurate.     Shelda Jakes, MD 08/28/13 [phone]

## 2013-08-28 NOTE — ED Notes (Signed)
Pt c/o sudden onset of dizziness last night. Pt arrived alert/oriented. Stable from w/c to stretcher with very mild staggering noted. Wife at bedside states feels like pt has "some"slurred speech at home. Pt states its just dry mouth. States if he looks to right side he feels like he is going to fall. Denies pain/weakness. pupills perrla and neuro assessment in triage negative.

## 2013-12-12 DIAGNOSIS — E785 Hyperlipidemia, unspecified: Secondary | ICD-10-CM | POA: Diagnosis not present

## 2013-12-12 DIAGNOSIS — J45909 Unspecified asthma, uncomplicated: Secondary | ICD-10-CM | POA: Diagnosis not present

## 2013-12-12 DIAGNOSIS — IMO0002 Reserved for concepts with insufficient information to code with codable children: Secondary | ICD-10-CM | POA: Diagnosis not present

## 2013-12-12 DIAGNOSIS — I10 Essential (primary) hypertension: Secondary | ICD-10-CM | POA: Diagnosis not present

## 2013-12-12 DIAGNOSIS — Z79899 Other long term (current) drug therapy: Secondary | ICD-10-CM | POA: Diagnosis not present

## 2013-12-12 DIAGNOSIS — Z125 Encounter for screening for malignant neoplasm of prostate: Secondary | ICD-10-CM | POA: Diagnosis not present

## 2014-02-14 ENCOUNTER — Telehealth: Payer: Self-pay

## 2014-02-14 NOTE — Telephone Encounter (Signed)
Pt was referred by Dr. Gerarda Fraction for screening colonoscopy. I called him and he said he is not having any problems and does not want to have it done now. He said he will call when he is ready.   Letter to PCP.

## 2014-03-26 DIAGNOSIS — L02619 Cutaneous abscess of unspecified foot: Secondary | ICD-10-CM | POA: Diagnosis not present

## 2014-03-26 DIAGNOSIS — Z6825 Body mass index (BMI) 25.0-25.9, adult: Secondary | ICD-10-CM | POA: Diagnosis not present

## 2014-05-22 HISTORY — PX: CARDIAC CATHETERIZATION: SHX172

## 2014-05-24 ENCOUNTER — Emergency Department (HOSPITAL_COMMUNITY)
Admission: EM | Admit: 2014-05-24 | Discharge: 2014-05-24 | Disposition: A | Discharge status: Home / Self Care | Payer: Medicare Other | Attending: Emergency Medicine | Admitting: Emergency Medicine

## 2014-05-24 ENCOUNTER — Telehealth: Payer: Self-pay | Admitting: Cardiovascular Disease

## 2014-05-24 ENCOUNTER — Encounter (HOSPITAL_COMMUNITY): Payer: Self-pay | Admitting: Emergency Medicine

## 2014-05-24 ENCOUNTER — Emergency Department (HOSPITAL_COMMUNITY): Payer: Medicare Other

## 2014-05-24 ENCOUNTER — Other Ambulatory Visit: Payer: Self-pay | Admitting: Cardiology

## 2014-05-24 DIAGNOSIS — J984 Other disorders of lung: Secondary | ICD-10-CM | POA: Diagnosis not present

## 2014-05-24 DIAGNOSIS — Z87442 Personal history of urinary calculi: Secondary | ICD-10-CM | POA: Diagnosis not present

## 2014-05-24 DIAGNOSIS — I10 Essential (primary) hypertension: Secondary | ICD-10-CM | POA: Diagnosis not present

## 2014-05-24 DIAGNOSIS — R61 Generalized hyperhidrosis: Secondary | ICD-10-CM | POA: Diagnosis not present

## 2014-05-24 DIAGNOSIS — Z87828 Personal history of other (healed) physical injury and trauma: Secondary | ICD-10-CM | POA: Diagnosis not present

## 2014-05-24 DIAGNOSIS — I714 Abdominal aortic aneurysm, without rupture, unspecified: Secondary | ICD-10-CM | POA: Diagnosis present

## 2014-05-24 DIAGNOSIS — Z7982 Long term (current) use of aspirin: Secondary | ICD-10-CM | POA: Diagnosis not present

## 2014-05-24 DIAGNOSIS — F17200 Nicotine dependence, unspecified, uncomplicated: Secondary | ICD-10-CM | POA: Insufficient documentation

## 2014-05-24 DIAGNOSIS — R079 Chest pain, unspecified: Secondary | ICD-10-CM | POA: Diagnosis not present

## 2014-05-24 DIAGNOSIS — R0602 Shortness of breath: Secondary | ICD-10-CM | POA: Diagnosis not present

## 2014-05-24 DIAGNOSIS — J45909 Unspecified asthma, uncomplicated: Secondary | ICD-10-CM | POA: Diagnosis not present

## 2014-05-24 DIAGNOSIS — E785 Hyperlipidemia, unspecified: Secondary | ICD-10-CM | POA: Diagnosis not present

## 2014-05-24 DIAGNOSIS — Z79899 Other long term (current) drug therapy: Secondary | ICD-10-CM | POA: Insufficient documentation

## 2014-05-24 LAB — TROPONIN I: Troponin I: <0.3 ng/mL (ref <0.30)

## 2014-05-24 LAB — BASIC METABOLIC PANEL
Anion gap: 13 (ref 5–15)
BUN: 16 mg/dL (ref 6–23)
CHLORIDE: 106 meq/L (ref 96–112)
CO2: 22 meq/L (ref 19–32)
CREATININE: 1.24 mg/dL (ref 0.50–1.35)
Calcium: 9.3 mg/dL (ref 8.4–10.5)
GFR calc non Af Amer: 61 mL/min — ABNORMAL LOW (ref >90)
GFR, EST AFRICAN AMERICAN: 70 mL/min — AB (ref >90)
GLUCOSE: 106 mg/dL — AB (ref 70–99)
POTASSIUM: 4.6 meq/L (ref 3.7–5.3)
Sodium: 141 mEq/L (ref 137–147)

## 2014-05-24 LAB — CBC WITH DIFFERENTIAL/PLATELET
Basophils Absolute: 0.1 10*3/uL (ref 0.0–0.1)
Basophils Relative: 1 % (ref 0–1)
Eosinophils Absolute: 0.7 10*3/uL (ref 0.0–0.7)
Eosinophils Relative: 9 % — ABNORMAL HIGH (ref 0–5)
HEMATOCRIT: 44.6 % (ref 39.0–52.0)
HEMOGLOBIN: 16 g/dL (ref 13.0–17.0)
LYMPHS ABS: 3.2 10*3/uL (ref 0.7–4.0)
LYMPHS PCT: 42 % (ref 12–46)
MCH: 32.9 pg (ref 26.0–34.0)
MCHC: 35.9 g/dL (ref 30.0–36.0)
MCV: 91.6 fL (ref 78.0–100.0)
MONO ABS: 0.5 10*3/uL (ref 0.1–1.0)
MONOS PCT: 7 % (ref 3–12)
NEUTROS ABS: 3.1 10*3/uL (ref 1.7–7.7)
NEUTROS PCT: 41 % — AB (ref 43–77)
Platelets: 231 10*3/uL (ref 150–400)
RBC: 4.87 MIL/uL (ref 4.22–5.81)
RDW: 13.7 % (ref 11.5–15.5)
WBC: 7.6 10*3/uL (ref 4.0–10.5)

## 2014-05-24 LAB — TSH: TSH: 1.23 u[IU]/mL (ref 0.350–4.500)

## 2014-05-24 LAB — PRO B NATRIURETIC PEPTIDE: Pro B Natriuretic peptide (BNP): 32.9 pg/mL (ref 0–125)

## 2014-05-24 LAB — I-STAT TROPONIN, ED: Troponin i, poc: 0.01 ng/mL (ref 0.00–0.08)

## 2014-05-24 MED ORDER — NITROGLYCERIN 0.4 MG SL SUBL
0.4000 mg | SUBLINGUAL_TABLET | SUBLINGUAL | Status: DC | PRN
  Administered 2014-05-24: 0.4 mg via SUBLINGUAL
  Filled 2014-05-24: qty 1

## 2014-05-24 MED ORDER — ASPIRIN 81 MG PO CHEW
324.0000 mg | CHEWABLE_TABLET | Freq: Once | ORAL | Status: AC
  Administered 2014-05-24: 324 mg via ORAL
  Filled 2014-05-24: qty 4

## 2014-05-24 NOTE — ED Provider Notes (Signed)
CSN: [id number]     Arrival date & time 05/24/14  1340 History   First MD Initiated Contact with Patient 05/24/14 1351     Chief Complaint  Patient presents with  . Chest Pain  . Shortness of Breath     (Consider location/radiation/quality/duration/timing/severity/associated sxs/prior Treatment) Patient is a 63 y.o. male presenting with chest pain and shortness of breath. The history is provided by the patient and medical records.  Chest Pain Associated symptoms: shortness of breath   Shortness of Breath Associated symptoms: chest pain    This is a 63 year old male with past medical history significant for asthma, hypertension, hyperlipidemia, kidney stones, presenting to the ED for further evaluation of chest pain. Patient states in the past 2-3 weeks he's been having intermittent episodes of chest pain. States chest pain mostly occurs with exertion but can occur independent of exertion as well.  Episodes are lasting variable amount of time (sometimes a few minutes, others close to 30 mins).  States the pain occurs it is localized to the left side of his chest with radiation into the left-sided neck and left arm which he describes as a "blockage sensation."  He endorses associated shortness of breath, diaphoresis, and nausea when pain occurs.  Most recent episode was last night, which was relieved with aspirin. Patient continues to smoke on a daily basis, although he has cut back.  Patient followed by cardiology, Dr. Burt Knack.  Last myoview stress test in 2013 with small fixed apical defect suggestive of thinning; no ischemia; mild LVE and global hypokinesis.  Patient does have family hx of CAD and MI.  VS stable on arrival.  Past Medical History  Diagnosis Date  . Hypertension   . Asthma   . Kidney stones   . Head trauma    Past Surgical History  Procedure Laterality Date  . Back surgery    . Breast surgery     Family History  Problem Relation Age of Onset  . Heart disease Mother   .  Heart attack Father   . Stroke Father    History  Substance Use Topics  . Smoking status: Current Every Day Smoker -- 1.50 packs/day  . Smokeless tobacco: Not on file  . Alcohol Use: Yes     Comment: 2 beers a month    Review of Systems  Respiratory: Positive for shortness of breath.   Cardiovascular: Positive for chest pain.      Allergies  Review of patient's allergies indicates no known allergies.  Home Medications   Prior to Admission medications   Medication Sig Start Date End Date Taking? Authorizing Provider  albuterol (PROVENTIL HFA;VENTOLIN HFA) 108 (90 BASE) MCG/ACT inhaler Inhale 2 puffs into the lungs every 6 (six) hours as needed for wheezing or shortness of breath.    Historical Provider, MD  amLODipine (NORVASC) 10 MG tablet Take 10 mg by mouth daily.      Historical Provider, MD  aspirin EC 81 MG tablet Take 81 mg by mouth daily.    Historical Provider, MD  meclizine (ANTIVERT) 25 MG tablet Take 1 tablet (25 mg total) by mouth 4 (four) times daily. 08/28/13   Fredia Sorrow, MD  promethazine (PHENERGAN) 25 MG tablet Take 1 tablet (25 mg total) by mouth every 6 (six) hours as needed for nausea or vomiting. 08/28/13   Fredia Sorrow, MD  simvastatin (ZOCOR) 20 MG tablet Take 20 mg by mouth at bedtime.      Historical Provider, MD   BP 141/91  Pulse 82  Temp(Src) 97.6 F (36.4 C) (Oral)  Resp 20  SpO2 97%  Physical Exam  Nursing note and vitals reviewed. Constitutional: He is oriented to person, place, and time. He appears well-developed and well-nourished. No distress.  HENT:  Head: Normocephalic and atraumatic.  Mouth/Throat: Oropharynx is clear and moist.  Eyes: Conjunctivae and EOM are normal. Pupils are equal, round, and reactive to light.  Neck: Normal range of motion. Neck supple.  Cardiovascular: Normal rate, regular rhythm and normal heart sounds.   Pulmonary/Chest: Effort normal and breath sounds normal. No respiratory distress. He has no  wheezes.  Abdominal: Soft. Bowel sounds are normal. There is no tenderness. There is no guarding.  Musculoskeletal: Normal range of motion. He exhibits no edema.  Neurological: He is alert and oriented to person, place, and time.  Skin: Skin is warm and dry. He is not diaphoretic.  Psychiatric: He has a normal mood and affect.    ED Course  Procedures (including critical care time) Labs Review Labs Reviewed  CBC WITH DIFFERENTIAL - Abnormal; Notable for the following:    Neutrophils Relative % 41 (*)    Eosinophils Relative 9 (*)    All other components within normal limits  BASIC METABOLIC PANEL - Abnormal; Notable for the following:    Glucose, Bld 106 (*)    GFR calc non Af Amer 61 (*)    GFR calc Af Amer 70 (*)    All other components within normal limits  TROPONIN I  PRO B NATRIURETIC PEPTIDE  I-STAT TROPOININ, ED    Imaging Review Dg Chest 2 View  05/24/2014   CLINICAL DATA:  Chest pain and shortness of bur  EXAM: CHEST  2 VIEW  COMPARISON:  08/28/2013  FINDINGS: Streaky lower lung opacities, present in the lingula more than middle lobe, are stable from 2014. There is borderline pulmonary hyperinflation, possible COPD. There is no edema, consolidation, effusion, or pneumothorax. Normal heart size and stable aortic contours. Diffuse degenerative endplate spurring.  IMPRESSION: 1. No active cardiopulmonary disease. 2. Mild lower lung scarring.   Electronically Signed   By: Jorje Guild M.D.   On: 05/24/2014 14:57     EKG Interpretation   Date/Time:  Thursday May 24 2014 13:44:53 EDT Ventricular Rate:  92 PR Interval:  168 QRS Duration: 116 QT Interval:  380 QTC Calculation: 469 R Axis:   -42 Text Interpretation:  Normal sinus rhythm Left axis deviation Right bundle  branch block Abnormal ECG No significant change since last tracing  Confirmed by KNAPP  MD-J, JON ([id number]) on 05/24/2014 4:00:51 PM      MDM   Final diagnoses:  Chest pain, unspecified chest pain  type  Shortness of breath  Diaphoresis   63 year old male with intermittent exertional chest pain over the past 2-3 weeks. Associated shortness of breath, diaphoresis, left arm and neck pain when pain occurs.  Called cardiology office earlier today and encouraged to come to ED for evaluation.  Patient does not have known coronary artery disease, but does have multiple risk factors and strong family history.  Story concerning for unstable angina.   Will start cardiac work-up, likely admission.  EKG sinus rhythm without ischemic changes. Troponin is negative. Chest x-ray is clear. Lab work is reassuring. After aspirin and sublingual nitroglycerin pain has resolved.  Symptoms remain concerning.  Consulted cardiology, will evaluate in the ED and admit for further management.  Larene Pickett, PA-C 05/24/14 1605

## 2014-05-24 NOTE — Telephone Encounter (Signed)
Pt last seen by Dr Burt Knack 09/17/2011.   I spoke with the pt and his symptoms began 2-3 weeks ago.  The pt complained of left arm, shoulder and neck pain.  The pt is also having chest pain/pressure at this time (4/10).  The pt's symptoms have continued to increase in frequency and intensity over the past 2-3 weeks.  The pt complains of worsening SOB with exertion, fatigue, lightheadedness over the past 2-3 weeks and complains of nausea which started last night. Due to the pt's escalating symptoms I advised him to proceed to the ER.  The pt will have someone drive him to [facility] for evaluation. [facility] notified.

## 2014-05-24 NOTE — ED Provider Notes (Signed)
Medical screening examination/treatment/procedure(s) were conducted as a shared visit with non-physician practitioner(s) and myself.  I personally evaluated the patient during the encounter.   EKG Interpretation   Date/Time:  Thursday May 24 2014 13:44:53 EDT Ventricular Rate:  92 PR Interval:  168 QRS Duration: 116 QT Interval:  380 QTC Calculation: 469 R Axis:   -42 Text Interpretation:  Normal sinus rhythm Left axis deviation Right bundle  branch block Abnormal ECG No significant change since last tracing  Confirmed by Caci Orren  MD-J, Kaira Stringfield ([id number]) on 05/24/2014 4:00:51 PM      Patient has no known coronary artery disease but has cardiac risk factors including smoking and hypertension and presented to the emergency room with intermittent chest pain for the last 2-3 weeks..  Patient called his cardiologist and was told to come to the emergency department.  Symptoms are concerning for acute coronary syndrome.   Plan on labs, xrays, cardiac consultation    Dorie Rank, MD 05/24/14 [phone]

## 2014-05-24 NOTE — Discharge Instructions (Signed)
Our office will call you tomorrow for the time for non walking stress test on Tuesday 05/29/14 and abd ultrasound to evaluate your abdominal aneurysm. On Tuesday as well.  You will follow up with Dr. Burt Knack or one of his PA or NPs.   No strenuous activity this weekend.  Try to decrease or stop smoking.  Continue medications as before.  If your symptoms increase, come to ER.     Abdominal Aortic Aneurysm An aneurysm is a weakened or damaged part of an artery wall that bulges from the normal force of blood pumping through the body. An abdominal aortic aneurysm is an aneurysm that occurs in the lower part of the aorta, the main artery of the body.  The major concern with an abdominal aortic aneurysm is that it can enlarge and burst (rupture) or blood can flow between the layers of the wall of the aorta through a tear (aorticdissection). Both of these conditions can cause bleeding inside the body and can be life threatening unless diagnosed and treated promptly. CAUSES  The exact cause of an abdominal aortic aneurysm is unknown. Some contributing factors are:   A hardening of the arteries caused by the buildup of fat and other substances in the lining of a blood vessel (arteriosclerosis).  Inflammation of the walls of an artery (arteritis).   Connective tissue diseases, such as Marfan syndrome.   Abdominal trauma.   An infection, such as syphilis or staphylococcus, in the wall of the aorta (infectious aortitis) caused by bacteria. RISK FACTORS  Risk factors that contribute to an abdominal aortic aneurysm may include:  Age older than 39 years.   High blood pressure (hypertension).  Male gender.  Ethnicity (white race).  Obesity.  Family history of aneurysm (first degree relatives only).  Tobacco use. PREVENTION  The following healthy lifestyle habits may help decrease your risk of abdominal aortic aneurysm:  Quitting smoking. Smoking can raise your blood pressure and cause  arteriosclerosis.  Limiting or avoiding alcohol.  Keeping your blood pressure, blood sugar level, and cholesterol levels within normal limits.  Decreasing your salt intake. In somepeople, too much salt can raise blood pressure and increase your risk of abdominal aortic aneurysm.  Eating a diet low in saturated fats and cholesterol.  Increasing your fiber intake by including whole grains, vegetables, and fruits in your diet. Eating these foods may help lower blood pressure.  Maintaining a healthy weight.  Staying physically active and exercising regularly. SYMPTOMS  The symptoms of abdominal aortic aneurysm may vary depending on the size and rate of growth of the aneurysm.Most grow slowly and do not have any symptoms. When symptoms do occur, they may include:  Pain (abdomen, side, lower back, or groin). The pain may vary in intensity. A sudden onset of severe pain may indicate that the aneurysm has ruptured.  Feeling full after eating only small amounts of food.  Nausea or vomiting or both.  Feeling a pulsating lump in the abdomen.  Feeling faint or passing out. DIAGNOSIS  Since most unruptured abdominal aortic aneurysms have no symptoms, they are often discovered during diagnostic exams for other conditions. An aneurysm may be found during the following procedures:  Ultrasonography (A one-time screening for abdominal aortic aneurysm by ultrasonography is also recommended for all men aged 67-75 years who have ever smoked).  X-ray exams.  A computed tomography (CT).  Magnetic resonance imaging (MRI).  Angiography or arteriography. TREATMENT  Treatment of an abdominal aortic aneurysm depends on the size of your  aneurysm, your age, and risk factors for rupture. Medication to control blood pressure and pain may be used to manage aneurysms smaller than 6 cm. Regular monitoring for enlargement may be recommended by your caregiver if:  The aneurysm is 3-4 cm in size (an annual  ultrasonography may be recommended).  The aneurysm is 4-4.5 cm in size (an ultrasonography every 6 months may be recommended).  The aneurysm is larger than 4.5 cm in size (your caregiver may ask that you be examined by a vascular surgeon). If your aneurysm is larger than 6 cm, surgical repair may be recommended. There are two main methods for repair of an aneurysm:   Endovascular repair (a minimally invasive surgery). This is done most often.  Open repair. This method is used if an endovascular repair is not possible. Document Released: 06/17/2005 Document Revised: 01/02/2013 Document Reviewed: 10/07/2012 [facility] Patient Information 2015 [place], [place]. This information is not intended to replace advice given to you by your health care provider. Make sure you discuss any questions you have with your health care provider.

## 2014-05-24 NOTE — ED Notes (Signed)
Pt reports chest pain SOB and left neck pain. Pt states that Dr. Burt Knack told him to come here for eval.

## 2014-05-24 NOTE — ED Notes (Signed)
Patient in NAD at time of d/c, out with family member with no difficulty

## 2014-05-24 NOTE — H&P (Signed)
John Choi is an 63 y.o. male.    Primary Cardiologist:Dr. Burt Knack PCP:  [name]., MD  Chief Complaint: chest pain HPI:  63 year-old gentleman presented for initial evaluation of abdominal aortic aneurysm 4 cm in diameter in 08/2011 - incidental finding with unrelated abd pain.  At that time he also described chest pain felt a left-sided "knifelike pain" with exertion. He predominately noted this when he was going up and down stairs. The patient does have a family history of coronary disease but there is no premature CAD. His father died of myocardial infarction at age 84 his mother had coronary disease in her 44s now deceased as well. There is no family history of aneurysm.   The patient denies palpitations, orthopnea, PND, and syncope.   09/2011:  ECHO: Left ventricle: There is question of mild hypokinesis of the mid lateral wall and mild hypo of the inferior wall. The cavity size was normal. Wall thickness was increased in a pattern of mild LVH. Systolic function was normal. The estimated ejection fraction was in the range of 50% to 55%.  STRESS TEST:  Overall Impression: Low risk stress nuclear study with a small fixed apical defect suggestive of thinning; no ischemia; mild LVE and global hypokinesis. 09-24-11  Over the last 2-3 weeks has this fullness in upper lt ant chest.  No associated symptoms.  Last pm developed a different more intense pain, sharp with radiation to Lt neck.  + SOB, Nausea, mild diaphoresis.  Here in ER NTG with improvement.  Currently with minimal pain.   Past Medical History  Diagnosis Date  . Hypertension   . Asthma   . Kidney stones   . Head trauma     Past Surgical History  Procedure Laterality Date  . Back surgery    . Breast surgery      Family History  Problem Relation Age of Onset  . Heart disease Mother   . Heart attack Father   . Stroke Father   . Heart disease Father   . Cancer Sister   . Heart disease Sister     . Cancer Brother   . Stroke Brother    Social History:  reports that he has been smoking.  He does not have any smokeless tobacco history on file. He reports that he drinks alcohol. His drug history is not on file.  Allergies: No Known Allergies  Outpatient Medications: No current facility-administered medications on file prior to encounter.   Current Outpatient Prescriptions on File Prior to Encounter  Medication Sig Dispense Refill  . albuterol (PROVENTIL HFA;VENTOLIN HFA) 108 (90 BASE) MCG/ACT inhaler Inhale 2 puffs into the lungs every 6 (six) hours as needed for wheezing or shortness of breath.      Marland Kitchen amLODipine (NORVASC) 10 MG tablet Take 10 mg by mouth daily.        Marland Kitchen aspirin EC 81 MG tablet Take 81 mg by mouth daily.      . simvastatin (ZOCOR) 20 MG tablet Take 20 mg by mouth at bedtime.          Results for orders placed during the hospital encounter of 05/24/14 (from the past 48 hour(s))  CBC WITH DIFFERENTIAL     Status: Abnormal   Collection Time    05/24/14  2:04 PM      Result Value Ref Range   WBC 7.6  4.0 - 10.5 K/uL   RBC 4.87  4.22 - 5.81  MIL/uL   Hemoglobin 16.0  13.0 - 17.0 g/dL   HCT 44.6  39.0 - 52.0 %   MCV 91.6  78.0 - 100.0 fL   MCH 32.9  26.0 - 34.0 pg   MCHC 35.9  30.0 - 36.0 g/dL   RDW 13.7  11.5 - 15.5 %   Platelets 231  150 - 400 K/uL   Neutrophils Relative % 41 (*) 43 - 77 %   Neutro Abs 3.1  1.7 - 7.7 K/uL   Lymphocytes Relative 42  12 - 46 %   Lymphs Abs 3.2  0.7 - 4.0 K/uL   Monocytes Relative 7  3 - 12 %   Monocytes Absolute 0.5  0.1 - 1.0 K/uL   Eosinophils Relative 9 (*) 0 - 5 %   Eosinophils Absolute 0.7  0.0 - 0.7 K/uL   Basophils Relative 1  0 - 1 %   Basophils Absolute 0.1  0.0 - 0.1 K/uL  BASIC METABOLIC PANEL     Status: Abnormal   Collection Time    05/24/14  2:04 PM      Result Value Ref Range   Sodium 141  137 - 147 mEq/L   Potassium 4.6  3.7 - 5.3 mEq/L   Chloride 106  96 - 112 mEq/L   CO2 22  19 - 32 mEq/L   Glucose,  Bld 106 (*) 70 - 99 mg/dL   BUN 16  6 - 23 mg/dL   Creatinine, Ser 1.24  0.50 - 1.35 mg/dL   Calcium 9.3  8.4 - 10.5 mg/dL   GFR calc non Af Amer 61 (*) >90 mL/min   GFR calc Af Amer 70 (*) >90 mL/min   Comment: (NOTE)     The eGFR has been calculated using the CKD EPI equation.     This calculation has not been validated in all clinical situations.     eGFR's persistently <90 mL/min signify possible Chronic Kidney     Disease.   Anion gap 13  5 - 15  TROPONIN I     Status: None   Collection Time    05/24/14  2:04 PM      Result Value Ref Range   Troponin I <0.30  <0.30 ng/mL   Comment:            Due to the release kinetics of cTnI,     a negative result within the first hours     of the onset of symptoms does not rule out     myocardial infarction with certainty.     If myocardial infarction is still suspected,     repeat the test at appropriate intervals.  PRO B NATRIURETIC PEPTIDE     Status: None   Collection Time    05/24/14  2:04 PM      Result Value Ref Range   Pro B Natriuretic peptide (BNP) 32.9  0 - 125 pg/mL  I-STAT TROPOININ, ED     Status: None   Collection Time    05/24/14  2:13 PM      Result Value Ref Range   Troponin i, poc 0.01  0.00 - 0.08 ng/mL   Comment 3            Comment: Due to the release kinetics of cTnI,     a negative result within the first hours     of the onset of symptoms does not rule out     myocardial infarction with certainty.  If myocardial infarction is still suspected,     repeat the test at appropriate intervals.   Dg Chest 2 View  05/24/2014   CLINICAL DATA:  Chest pain and shortness of bur  EXAM: CHEST  2 VIEW  COMPARISON:  08/28/2013  FINDINGS: Streaky lower lung opacities, present in the lingula more than middle lobe, are stable from 2014. There is borderline pulmonary hyperinflation, possible COPD. There is no edema, consolidation, effusion, or pneumothorax. Normal heart size and stable aortic contours. Diffuse degenerative  endplate spurring.  IMPRESSION: 1. No active cardiopulmonary disease. 2. Mild lower lung scarring.   Electronically Signed   By: Jorje Guild M.D.   On: 05/24/2014 14:57    ROS: General:no colds or fevers, no weight changes Skin:no rashes or ulcers HEENT:no blurred vision, no congestion CV:see HPI PUL:see HPI + tobacco GI:no diarrhea constipation or melena, no indigestion GU:no hematuria, no dysuria MS:no joint pain, no claudication, + back pain with hx of 4  surgeries Neuro:no syncope, no lightheadedness Endo:no diabetes, no thyroid disease   Blood pressure 141/91, pulse 82, temperature 97.6 F (36.4 C), temperature source Oral, resp. rate 20, SpO2 97.00%. PE: General:Pleasant affect, NAD Skin:Warm and dry, brisk capillary refill HEENT:normocephalic, sclera clear, mucus membranes moist Neck:supple, no JVD, no bruits , no adenopathy, no thryomegaly Heart:S1S2 RRR without murmur, gallup, rub or click Lungs:wthout rales, occ rhonchi, occ wheeze HTM:BPJP, non tender, + BS, do not palpate liver spleen or masses Ext:no lower ext edema, 2+ post tib pulses, 2+ radial pulses Neuro:alert and oriented X 3 , MAE, follows commands, + facial symmetry    Assessment/Plan Principal Problem:   Chest pain Active Problems:   Abdominal aortic aneurysm   Hyperlipidemia  See Dr. Claris Gladden note.  [place] Nurse Practitioner Certified [place] Pager [phone] or after 5pm or weekends call [phone] 05/24/2014, 4:33 PM  Patient seen with NP, agree with the above note.  He has been having atypical chest pain x 2-3 weeks.  Pain is left lateral, comes and goes.  When it is present, it can last seconds to hours.  It is not related to exertion.  He is tender with palpation of left lateral chest.  His ECG is unchanged.  He does have risk factors for CAD: Gender, heavy smoking, family history of CAD.  Initial cardiac enzymes are negative.  Given atypical nature of pain, would  be reasonable to get repeat troponin in 4 hours.  If this is normal, he can go home with close followup: we will have him get a Lexiscan Cardiolite next Tuesday and will arrange for abdominal US to reassess his known AAA.  He will followup with Dr Burt Knack who he has seen in the past.  We discussed smoking cessation.   If troponin is positive, will admit for cath.   Loralie Champagne 05/24/2014 4:41 PM

## 2014-05-24 NOTE — Telephone Encounter (Signed)
New message          C/o pain on the left side between neck and shoulder / pt increased his Aspirin on his own / pt thinks he may have a blockage in his carotid

## 2014-05-29 ENCOUNTER — Encounter (HOSPITAL_COMMUNITY): Payer: Medicare Other

## 2014-05-30 ENCOUNTER — Ambulatory Visit (HOSPITAL_COMMUNITY)
Admission: RE | Admit: 2014-05-30 | Discharge: 2014-05-30 | Disposition: A | Discharge status: Home / Self Care | Payer: Medicare Other | Source: Ambulatory Visit | Attending: Cardiology | Admitting: Cardiology

## 2014-05-30 ENCOUNTER — Ambulatory Visit (HOSPITAL_BASED_OUTPATIENT_CLINIC_OR_DEPARTMENT_OTHER)
Admission: RE | Admit: 2014-05-30 | Discharge: 2014-05-30 | Disposition: A | Discharge status: Home / Self Care | Payer: Medicare Other | Source: Ambulatory Visit | Attending: Cardiology | Admitting: Cardiology

## 2014-05-30 DIAGNOSIS — I714 Abdominal aortic aneurysm, without rupture, unspecified: Secondary | ICD-10-CM | POA: Diagnosis not present

## 2014-05-30 DIAGNOSIS — M79609 Pain in unspecified limb: Secondary | ICD-10-CM | POA: Diagnosis not present

## 2014-05-30 DIAGNOSIS — R0989 Other specified symptoms and signs involving the circulatory and respiratory systems: Secondary | ICD-10-CM | POA: Diagnosis not present

## 2014-05-30 DIAGNOSIS — R079 Chest pain, unspecified: Secondary | ICD-10-CM | POA: Insufficient documentation

## 2014-05-30 DIAGNOSIS — R0602 Shortness of breath: Secondary | ICD-10-CM | POA: Insufficient documentation

## 2014-05-30 DIAGNOSIS — R42 Dizziness and giddiness: Secondary | ICD-10-CM | POA: Diagnosis not present

## 2014-05-30 DIAGNOSIS — M542 Cervicalgia: Secondary | ICD-10-CM | POA: Insufficient documentation

## 2014-05-30 DIAGNOSIS — J45909 Unspecified asthma, uncomplicated: Secondary | ICD-10-CM | POA: Diagnosis not present

## 2014-05-30 DIAGNOSIS — M25519 Pain in unspecified shoulder: Secondary | ICD-10-CM | POA: Diagnosis not present

## 2014-05-30 DIAGNOSIS — R5381 Other malaise: Secondary | ICD-10-CM | POA: Insufficient documentation

## 2014-05-30 DIAGNOSIS — R0609 Other forms of dyspnea: Secondary | ICD-10-CM | POA: Diagnosis not present

## 2014-05-30 DIAGNOSIS — Z8249 Family history of ischemic heart disease and other diseases of the circulatory system: Secondary | ICD-10-CM | POA: Diagnosis not present

## 2014-05-30 DIAGNOSIS — R5383 Other fatigue: Secondary | ICD-10-CM

## 2014-05-30 MED ORDER — TECHNETIUM TC 99M SESTAMIBI GENERIC - CARDIOLITE
10.6000 | Freq: Once | INTRAVENOUS | Status: AC | PRN
Start: 2014-05-30 — End: 2014-05-30
  Administered 2014-05-30: 11 via INTRAVENOUS

## 2014-05-30 MED ORDER — REGADENOSON 0.4 MG/5ML IV SOLN
0.4000 mg | Freq: Once | INTRAVENOUS | Status: AC
  Administered 2014-05-30: 0.4 mg via INTRAVENOUS

## 2014-05-30 MED ORDER — TECHNETIUM TC 99M SESTAMIBI GENERIC - CARDIOLITE
30.6000 | Freq: Once | INTRAVENOUS | Status: AC | PRN
  Administered 2014-05-30: 31 via INTRAVENOUS

## 2014-05-30 MED ORDER — AMINOPHYLLINE 25 MG/ML IV SOLN
75.0000 mg | Freq: Once | INTRAVENOUS | Status: AC
  Administered 2014-05-30: 75 mg via INTRAVENOUS

## 2014-05-30 NOTE — Procedures (Addendum)
[place]  [facility] [address] [place] [address] [phone]  Cardiology Nuclear Med Study  John Choi is a 63 y.o. male     MRN : [id number]     DOB: 11-27-1950  Procedure Date: 05/30/2014  Nuclear Med Background Indication for Stress Test:  Evaluation for Ischemia and [facility] History:  Asthma and No prior cardiac history;Last NUC MPI on 09/24/2011-nonischemic;EF=48%;ECHO LVEF=50-55%. Cardiac Risk Factors: Family History - CAD, Hypertension, Lipids and Smoker  Symptoms:  Chest Pain, Dizziness, DOE, Fatigue, Light-Headedness, SOB and Left arm, shoulder and neck pain.   Nuclear Pre-Procedure Caffeine/Decaff Intake:  1:00am NPO After: 11am   IV Site: R Forearm  IV 0.9% NS with Angio Cath:  22g  Chest Size (in):  44"  IV Started by: Rolene Course, RN  Height: 6' (1.829 m)  Cup Size: n/a  BMI:  Body mass index is 23.86 kg/(m^2). Weight:  176 lb (79.833 kg)   Tech Comments:  n/a    Nuclear Med Study 1 or 2 day study: 1 day  Stress Test Type:  [place] Provider:  [name], MD   Resting Radionuclide: Technetium 4m Sestamibi  Resting Radionuclide Dose: 10.6 mCi   Stress Radionuclide:  Technetium 29m Sestamibi  Stress Radionuclide Dose: 30.6 mCi           Stress Protocol Rest HR: 66 Stress HR: 100  Rest BP: 135/85 Stress BP: 141/90  Exercise Time (min): n/a METS: n/a          Dose of Adenosine (mg):  n/a Dose of Lexiscan: 0.4 mg  Dose of Atropine (mg): n/a Dose of Dobutamine: n/a mcg/kg/min (at max HR)  Stress Test Technologist: Mellody Memos, CCT Nuclear Technologist: Imagene Riches, CNMT   Rest Procedure:  Myocardial perfusion imaging was performed at rest 45 minutes following the intravenous administration of Technetium 58m Sestamibi. Stress Procedure:  The patient received IV Lexiscan 0.4 mg over 15-seconds.  Technetium 29m Sestamibi injected IV at 30-seconds.  Patient experienced  shortness of breath and neck pressure on both sides. He was given 75 mg of Aminophylline IV at 5 minutes. There were no significant changes with Lexiscan.  Quantitative spect images were obtained after a 45 minute delay.  Transient Ischemic Dilatation (Normal <1.22):  1.13  QGS EDV:  137 ml QGS ESV:  74 ml LV Ejection Fraction: 46%        Rest ECG: NSR - Normal EKG  Stress ECG: No significant change from baseline ECG  QPS Raw Data Images:  Normal; no motion artifact; normal heart/lung ratio. Stress Images:  There is decreased uptake in the inferior wall. Rest Images:  Normal homogeneous uptake in all areas of the myocardium. Subtraction (SDS):  These findings are consistent with ischemia.  Impression Exercise Capacity:  Lexiscan with no exercise. BP Response:  Normal blood pressure response. Clinical Symptoms:  No significant symptoms noted. ECG Impression:  No significant ST segment change suggestive of ischemia. Comparison with Prior Nuclear Study: No significant change from previous study  Overall Impression:  Low risk stress nuclear study Subtle infero apical ischemia .  LV Wall Motion:  NL LV Function; NL Wall Motion   Lorretta Harp, MD  05/30/2014 3:38 PM  ]

## 2014-05-30 NOTE — Progress Notes (Signed)
Abdominal Aorta Duplex Completed. Elinda Bunten, BS, RDMS, RVT  

## 2014-06-04 ENCOUNTER — Encounter: Payer: Self-pay | Admitting: Cardiovascular Disease

## 2014-06-04 NOTE — Telephone Encounter (Signed)
Left message on machine for pt to contact the office.   

## 2014-06-04 NOTE — Telephone Encounter (Signed)
F/u ° ° °Pt returning your call. Please call pt. °

## 2014-06-04 NOTE — Telephone Encounter (Signed)
New message  Pt called states that he reviewed a call to schedule an appt with Dr. Burt Knack. No notes to schedule under pt options or per notes. Please assist

## 2014-06-04 NOTE — Telephone Encounter (Signed)
This encounter was created in error - please disregard.

## 2014-06-05 ENCOUNTER — Encounter: Payer: Self-pay | Admitting: Cardiovascular Disease

## 2014-06-05 ENCOUNTER — Ambulatory Visit (INDEPENDENT_AMBULATORY_CARE_PROVIDER_SITE_OTHER): Payer: Medicare Other | Admitting: Cardiovascular Disease

## 2014-06-05 ENCOUNTER — Other Ambulatory Visit: Payer: Self-pay | Admitting: Cardiovascular Disease

## 2014-06-05 VITALS — BP 124/82 | HR 91 | Ht 72.0 in | Wt 184.4 lb

## 2014-06-05 DIAGNOSIS — R079 Chest pain, unspecified: Secondary | ICD-10-CM

## 2014-06-05 DIAGNOSIS — I209 Angina pectoris, unspecified: Secondary | ICD-10-CM

## 2014-06-05 DIAGNOSIS — R9430 Abnormal result of cardiovascular function study, unspecified: Secondary | ICD-10-CM

## 2014-06-05 DIAGNOSIS — I714 Abdominal aortic aneurysm, without rupture, unspecified: Secondary | ICD-10-CM | POA: Diagnosis not present

## 2014-06-05 LAB — BASIC METABOLIC PANEL
BUN: 15 mg/dL (ref 6–23)
CO2: 23 meq/L (ref 19–32)
Calcium: 9.1 mg/dL (ref 8.4–10.5)
Chloride: 107 mEq/L (ref 96–112)
Creatinine, Ser: 1.3 mg/dL (ref 0.4–1.5)
GFR: 57.23 mL/min — ABNORMAL LOW (ref >60.00)
Glucose, Bld: 93 mg/dL (ref 70–99)
Potassium: 4.2 mEq/L (ref 3.5–5.1)
SODIUM: 139 meq/L (ref 135–145)

## 2014-06-05 LAB — PROTIME-INR
INR: 0.9 ratio (ref 0.8–1.0)
Prothrombin Time: 10.1 s (ref 9.6–13.1)

## 2014-06-05 MED ORDER — DIAZEPAM 2 MG PO TABS: 5.0000 mg | ORAL_TABLET | Freq: Once | ORAL | Status: DC

## 2014-06-05 MED ORDER — METOPROLOL SUCCINATE ER 25 MG PO TB24: 12.5000 mg | ORAL_TABLET | Freq: Every day | ORAL | Status: DC

## 2014-06-05 NOTE — Patient Instructions (Addendum)
Your physician has recommended you make the following change in your medication:  START Metoprolol Succinate 25mg  take one-half tablet by mouth daily  Your physician has requested that you have a cardiac catheterization. Cardiac catheterization is used to diagnose and/or treat various heart conditions. Doctors may recommend this procedure for a number of different reasons. The most common reason is to evaluate chest pain. Chest pain can be a symptom of coronary artery disease (CAD), and cardiac catheterization can show whether plaque is narrowing or blocking your heart's arteries. This procedure is also used to evaluate the valves, as well as measure the blood flow and oxygen levels in different parts of your heart. For further information please visit [email or web address]. Please follow instruction sheet, as given.  You have been referred to Dr Trula Slade at [facility] for evaluation of Abdominal Aortic Aneurysm.   Your physician recommends that you continue on your current medications as directed. Please refer to the Current Medication list given to you today.

## 2014-06-05 NOTE — Progress Notes (Signed)
HPI:  63 year old gentleman who has been seen in the past for abdominal aortic aneurysm, followed with serial imaging studies. He had a previous low risk stress Myoview study in 2013 when he was seen for atypical symptoms of angina. He presented earlier this month to the emergency department with chest pain. He was evaluated by Dr. Aundra Dubin who felt that he could be worked up as an outpatient. The patient's ongoing cardiovascular risk factors include gender, heavy tobacco history, and family history of coronary artery disease. His nuclear scan demonstrated subtle inferolateral apical ischemia and was interpreted as "low risk." His abdominal aortic duplex scan demonstrated a 5.4 x 5.3 cm infrarenal AAA with bilateral iliac aneurysmal dilatation.  The patient continues to complain of an aching pain in his left upper chest. This has radiated into his left arm and his neck. It is exacerbated by physical exertion. Symptoms have been present for about 6 months, but he states they are getting worse. He describes the pain as a "toothache." It is not affected by certain arm movements or positional changes. He denies dyspnea, palpitations, orthopnea, or PND. He has developed some abdominal discomfort that he attributes to a hernia. He has no other complaints today.   Outpatient Encounter Prescriptions as of 06/05/2014  Medication Sig  . albuterol (PROVENTIL HFA;VENTOLIN HFA) 108 (90 BASE) MCG/ACT inhaler Inhale 2 puffs into the lungs every 6 (six) hours as needed for wheezing or shortness of breath.  Marland Kitchen amLODipine (NORVASC) 10 MG tablet Take 10 mg by mouth daily.    Marland Kitchen aspirin EC 81 MG tablet Take 81 mg by mouth daily.  . simvastatin (ZOCOR) 20 MG tablet Take 20 mg by mouth at bedtime.    . metoprolol succinate (TOPROL XL) 25 MG 24 hr tablet Take 0.5 tablets (12.5 mg total) by mouth daily.    No Known Allergies  Past Medical History  Diagnosis Date  . Hypertension   . Asthma   . Kidney stones   . Head  trauma    ROS: Negative except as per HPI  BP 124/82  Pulse 91  Ht 6' (1.829 m)  Wt 184 lb 6.4 oz (83.643 kg)  BMI 25.00 kg/m2  PHYSICAL EXAM: Pt is alert and oriented, NAD HEENT: normal Neck: JVP - normal, carotids 2+= without bruits Lungs: CTA bilaterally CV: RRR without murmur or gallop Abd: soft, NT, Positive BS, no hepatomegaly Ext: no C/C/E, distal pulses intact and equal Skin: warm/dry no rash  Myoview Scan 05/30/2014: QPS Raw Data Images: Normal; no motion artifact; normal heart/lung  ratio. Stress Images: There is decreased uptake in the inferior wall. Rest Images: Normal homogeneous uptake in all areas of the  myocardium. Subtraction (SDS): These findings are consistent with ischemia.  Impression Exercise Capacity: Lexiscan with no exercise. BP Response: Normal blood pressure response. Clinical Symptoms: No significant symptoms noted. ECG Impression: No significant ST segment change suggestive of  ischemia. Comparison with Prior Nuclear Study: No significant change from  previous study  Overall Impression: Low risk stress nuclear study Subtle infero  apical ischemia .  LV Wall Motion: NL LV Function; NL Wall Motion  Abdominal Duplex 05/30/14: Infrarenal abdominal aortic aneurysm 5.4 x 5.3 cm with moderate mural thrombus. Aneurysmal dilatation of the iliac arteries.  ASSESSMENT AND PLAN: 1. Exertional angina with Abnormal Myoview scan. While the patient stress test is "low risk," he describes progressive angina with exertion over the last few months. He has CCS class 2-3 symptoms. We discussed potential diagnostic and  treatment options. I am going to start him on metoprolol succinate 12.5 mg daily. I have recommended that we proceed with cardiac catheterization to evaluate for obstructive CAD.  I have reviewed the risks, indications, and alternatives to cardiac catheterization were reviewed with the patient. Risks include but are not limited to bleeding,  infection, vascular injury, stroke, myocardial infection, arrhythmia, emergency cardiac surgery, and death. The patient understands the risks of serious complication is low (<8%). He will be scheduled at the next available time.  2. Hypertension. Blood pressure is controlled on amlodipine. Will add metoprolol succinate in the setting of his abdominal aortic aneurysm and exertional angina.  3. Infrarenal abdominal aortic aneurysm. Dimensions have increased, now 5.4 x 5.3 cm. As he is approaching the threshold for elective repair, I am going to refer him to vascular surgery. Likely will order a CTA of the abdomen and pelvis prior to vascular surgery consultation.  4. Hyperlipidemia. The patient takes simvastatin. He is followed by his primary care physician.   [name] MD 06/05/2014 9:38 AM

## 2014-06-06 ENCOUNTER — Encounter (HOSPITAL_COMMUNITY): Payer: Self-pay | Admitting: Pharmacy Technician

## 2014-06-06 LAB — CBC
HCT: 44.9 % (ref 39.0–52.0)
Hemoglobin: 15.3 g/dL (ref 13.0–17.0)
MCHC: 34 g/dL (ref 30.0–36.0)
MCV: 95.2 fl (ref 78.0–100.0)
PLATELETS: 260 10*3/uL (ref 150.0–400.0)
RBC: 4.72 Mil/uL (ref 4.22–5.81)
RDW: 14.5 % (ref 11.5–15.5)
WBC: 8.6 10*3/uL (ref 4.0–10.5)

## 2014-06-11 ENCOUNTER — Ambulatory Visit (HOSPITAL_COMMUNITY)
Admission: RE | Admit: 2014-06-11 | Discharge: 2014-06-11 | Disposition: A | Discharge status: Home / Self Care | Payer: Medicare Other | Source: Ambulatory Visit | Attending: Cardiovascular Disease | Admitting: Cardiovascular Disease

## 2014-06-11 ENCOUNTER — Encounter (HOSPITAL_COMMUNITY)
Admission: RE | Disposition: A | Discharge status: Home / Self Care | Payer: Self-pay | Source: Ambulatory Visit | Attending: Cardiovascular Disease

## 2014-06-11 ENCOUNTER — Encounter: Payer: Medicare Other | Admitting: Surgery

## 2014-06-11 DIAGNOSIS — I200 Unstable angina: Secondary | ICD-10-CM | POA: Diagnosis not present

## 2014-06-11 DIAGNOSIS — I7789 Other specified disorders of arteries and arterioles: Secondary | ICD-10-CM | POA: Diagnosis not present

## 2014-06-11 DIAGNOSIS — I209 Angina pectoris, unspecified: Secondary | ICD-10-CM

## 2014-06-11 DIAGNOSIS — I10 Essential (primary) hypertension: Secondary | ICD-10-CM | POA: Diagnosis not present

## 2014-06-11 DIAGNOSIS — I714 Abdominal aortic aneurysm, without rupture, unspecified: Secondary | ICD-10-CM | POA: Insufficient documentation

## 2014-06-11 DIAGNOSIS — E785 Hyperlipidemia, unspecified: Secondary | ICD-10-CM | POA: Insufficient documentation

## 2014-06-11 DIAGNOSIS — Z7982 Long term (current) use of aspirin: Secondary | ICD-10-CM | POA: Insufficient documentation

## 2014-06-11 DIAGNOSIS — F17200 Nicotine dependence, unspecified, uncomplicated: Secondary | ICD-10-CM | POA: Insufficient documentation

## 2014-06-11 DIAGNOSIS — R079 Chest pain, unspecified: Secondary | ICD-10-CM | POA: Insufficient documentation

## 2014-06-11 DIAGNOSIS — I739 Peripheral vascular disease, unspecified: Secondary | ICD-10-CM | POA: Diagnosis not present

## 2014-06-11 DIAGNOSIS — Z8249 Family history of ischemic heart disease and other diseases of the circulatory system: Secondary | ICD-10-CM | POA: Diagnosis not present

## 2014-06-11 HISTORY — PX: LEFT HEART CATHETERIZATION WITH CORONARY ANGIOGRAM: SHX5451

## 2014-06-11 SURGERY — LEFT HEART CATHETERIZATION WITH CORONARY ANGIOGRAM
Anesthesia: LOCAL

## 2014-06-11 MED ORDER — ACETAMINOPHEN 325 MG PO TABS: 650.0000 mg | ORAL_TABLET | ORAL | Status: DC | PRN

## 2014-06-11 MED ORDER — SODIUM CHLORIDE 0.9 % IV SOLN: 250.0000 mL | INTRAVENOUS | Status: DC | PRN

## 2014-06-11 MED ORDER — ASPIRIN 81 MG PO CHEW
81.0000 mg | CHEWABLE_TABLET | ORAL | Status: AC
  Administered 2014-06-11: 81 mg via ORAL

## 2014-06-11 MED ORDER — NITROGLYCERIN 1 MG/10 ML FOR IR/CATH LAB
INTRA_ARTERIAL | Status: AC
  Filled 2014-06-11: qty 10

## 2014-06-11 MED ORDER — HEPARIN SODIUM (PORCINE) 1000 UNIT/ML IJ SOLN
INTRAMUSCULAR | Status: AC
  Filled 2014-06-11: qty 1

## 2014-06-11 MED ORDER — FENTANYL CITRATE 0.05 MG/ML IJ SOLN
INTRAMUSCULAR | Status: AC
  Filled 2014-06-11: qty 2

## 2014-06-11 MED ORDER — SODIUM CHLORIDE 0.9 % IV SOLN
INTRAVENOUS | Status: DC
  Administered 2014-06-11: 09:00:00 via INTRAVENOUS

## 2014-06-11 MED ORDER — VERAPAMIL HCL 2.5 MG/ML IV SOLN
INTRAVENOUS | Status: AC
  Filled 2014-06-11: qty 2

## 2014-06-11 MED ORDER — HEPARIN (PORCINE) IN NACL 2-0.9 UNIT/ML-% IJ SOLN
INTRAMUSCULAR | Status: AC
  Filled 2014-06-11: qty 1000

## 2014-06-11 MED ORDER — OXYCODONE-ACETAMINOPHEN 5-325 MG PO TABS: 1.0000 | ORAL_TABLET | ORAL | Status: DC | PRN

## 2014-06-11 MED ORDER — SODIUM CHLORIDE 0.9 % IV SOLN: 1.0000 mL/kg/h | INTRAVENOUS | Status: DC

## 2014-06-11 MED ORDER — SODIUM CHLORIDE 0.9 % IJ SOLN: 3.0000 mL | INTRAMUSCULAR | Status: DC | PRN

## 2014-06-11 MED ORDER — SODIUM CHLORIDE 0.9 % IJ SOLN: 3.0000 mL | Freq: Two times a day (BID) | INTRAMUSCULAR | Status: DC

## 2014-06-11 MED ORDER — ONDANSETRON HCL 4 MG/2ML IJ SOLN: 4.0000 mg | Freq: Four times a day (QID) | INTRAMUSCULAR | Status: DC | PRN

## 2014-06-11 MED ORDER — MIDAZOLAM HCL 2 MG/2ML IJ SOLN
INTRAMUSCULAR | Status: AC
  Filled 2014-06-11: qty 2

## 2014-06-11 MED ORDER — ASPIRIN 81 MG PO CHEW
CHEWABLE_TABLET | ORAL | Status: AC
  Filled 2014-06-11: qty 1

## 2014-06-11 MED ORDER — LIDOCAINE HCL (PF) 1 % IJ SOLN
INTRAMUSCULAR | Status: AC
  Filled 2014-06-11: qty 30

## 2014-06-11 NOTE — H&P (View-Only) (Signed)
HPI:  63 year old gentleman who has been seen in the past for abdominal aortic aneurysm, followed with serial imaging studies. He had a previous low risk stress Myoview study in 2013 when he was seen for atypical symptoms of angina. He presented earlier this month to the emergency department with chest pain. He was evaluated by Dr. Aundra Dubin who felt that he could be worked up as an outpatient. The patient's ongoing cardiovascular risk factors include gender, heavy tobacco history, and family history of coronary artery disease. His nuclear scan demonstrated subtle inferolateral apical ischemia and was interpreted as "low risk." His abdominal aortic duplex scan demonstrated a 5.4 x 5.3 cm infrarenal AAA with bilateral iliac aneurysmal dilatation.  The patient continues to complain of an aching pain in his left upper chest. This has radiated into his left arm and his neck. It is exacerbated by physical exertion. Symptoms have been present for about 6 months, but he states they are getting worse. He describes the pain as a "toothache." It is not affected by certain arm movements or positional changes. He denies dyspnea, palpitations, orthopnea, or PND. He has developed some abdominal discomfort that he attributes to a hernia. He has no other complaints today.   Outpatient Encounter Prescriptions as of 06/05/2014  Medication Sig  . albuterol (PROVENTIL HFA;VENTOLIN HFA) 108 (90 BASE) MCG/ACT inhaler Inhale 2 puffs into the lungs every 6 (six) hours as needed for wheezing or shortness of breath.  Marland Kitchen amLODipine (NORVASC) 10 MG tablet Take 10 mg by mouth daily.    Marland Kitchen aspirin EC 81 MG tablet Take 81 mg by mouth daily.  . simvastatin (ZOCOR) 20 MG tablet Take 20 mg by mouth at bedtime.    . metoprolol succinate (TOPROL XL) 25 MG 24 hr tablet Take 0.5 tablets (12.5 mg total) by mouth daily.    No Known Allergies  Past Medical History  Diagnosis Date  . Hypertension   . Asthma   . Kidney stones   . Head  trauma    ROS: Negative except as per HPI  BP 124/82  Pulse 91  Ht 6' (1.829 m)  Wt 184 lb 6.4 oz (83.643 kg)  BMI 25.00 kg/m2  PHYSICAL EXAM: Pt is alert and oriented, NAD HEENT: normal Neck: JVP - normal, carotids 2+= without bruits Lungs: CTA bilaterally CV: RRR without murmur or gallop Abd: soft, NT, Positive BS, no hepatomegaly Ext: no C/C/E, distal pulses intact and equal Skin: warm/dry no rash  Myoview Scan 05/30/2014: QPS Raw Data Images: Normal; no motion artifact; normal heart/lung  ratio. Stress Images: There is decreased uptake in the inferior wall. Rest Images: Normal homogeneous uptake in all areas of the  myocardium. Subtraction (SDS): These findings are consistent with ischemia.  Impression Exercise Capacity: Lexiscan with no exercise. BP Response: Normal blood pressure response. Clinical Symptoms: No significant symptoms noted. ECG Impression: No significant ST segment change suggestive of  ischemia. Comparison with Prior Nuclear Study: No significant change from  previous study  Overall Impression: Low risk stress nuclear study Subtle infero  apical ischemia .  LV Wall Motion: NL LV Function; NL Wall Motion  Abdominal Duplex 05/30/14: Infrarenal abdominal aortic aneurysm 5.4 x 5.3 cm with moderate mural thrombus. Aneurysmal dilatation of the iliac arteries.  ASSESSMENT AND PLAN: 1. Exertional angina with Abnormal Myoview scan. While the patient stress test is "low risk," he describes progressive angina with exertion over the last few months. He has CCS class 2-3 symptoms. We discussed potential diagnostic and  treatment options. I am going to start him on metoprolol succinate 12.5 mg daily. I have recommended that we proceed with cardiac catheterization to evaluate for obstructive CAD.  I have reviewed the risks, indications, and alternatives to cardiac catheterization were reviewed with the patient. Risks include but are not limited to bleeding,  infection, vascular injury, stroke, myocardial infection, arrhythmia, emergency cardiac surgery, and death. The patient understands the risks of serious complication is low (<7%). He will be scheduled at the next available time.  2. Hypertension. Blood pressure is controlled on amlodipine. Will add metoprolol succinate in the setting of his abdominal aortic aneurysm and exertional angina.  3. Infrarenal abdominal aortic aneurysm. Dimensions have increased, now 5.4 x 5.3 cm. As he is approaching the threshold for elective repair, I am going to refer him to vascular surgery. Likely will order a CTA of the abdomen and pelvis prior to vascular surgery consultation.  4. Hyperlipidemia. The patient takes simvastatin. He is followed by his primary care physician.   [name] MD 06/05/2014 9:38 AM

## 2014-06-11 NOTE — CV Procedure (Signed)
    Cardiac Catheterization Procedure Note  Name: John Choi MRN: [id number] DOB: [birth year]  Procedure: Left Heart Cath, Selective Coronary Angiography, LV angiography, abdominal aortic angiography  Indication: CCS class III anginal symptoms, and known vascular disease, multiple cardiac risk factors.   Procedural Details: The right wrist was prepped, draped, and anesthetized with 1% lidocaine. Using the modified Seldinger technique, a 5/6 French Slender sheath was introduced into the right radial artery. 3 mg of verapamil was administered through the sheath, weight-based unfractionated heparin was administered intravenously. Standard Judkins catheters were used for selective coronary angiography and left ventriculography. The pigtail catheter was advanced into the descending thoracic aorta after ventriculography and then into the suprarenal abdominal aorta for abdominal aortic angiography. Catheter exchanges were performed over an exchange length guidewire. There were no immediate procedural complications. A TR band was used for radial hemostasis at the completion of the procedure.  The patient was transferred to the post catheterization recovery area for further monitoring.  Procedural Findings: Hemodynamics: AO 100/64 LV 101/8  Coronary angiography: Coronary dominance: right  Left mainstem: The left main is widely patent with no obstructive disease. The vessel divides into the LAD and left circumflex the  Left anterior descending (LAD): The LAD courses to the apex. The first diagonal is large in caliber and widely patent. There is no obstructive disease.  Left circumflex (LCx): The left circumflex is dominant. The vessel supplies a large first obtuse marginal without stenosis. The AV groove circumflex extends down and supplies a PLA and PDA branch. There are no stenoses throughout the left circumflex distribution.  Right coronary artery (RCA): Small caliber, nondominant vessel  without obstructive disease.  Left ventriculography: Left ventricular systolic function is normal, LVEF is estimated at 55-65%, there is no significant mitral regurgitation   Abdominal aortic angiography: The renal arteries are patent bilaterally. There is an infrarenal abdominal aortic aneurysm extending into the bilateral common iliac arteries.  Contrast: 115 cc Omnipaque  Estimated Blood Loss: Minimal  Final Conclusions:   1. Widely patent coronary arteries, left dominant 2. Normal LV function 3. Infrarenal abdominal aortic aneurysm   Recommendations: Referral to Dr. Trula Slade for further evaluation of his infrarenal AAA. Appears to have noncardiac chest pain. If he requires surgery, he can proceed without further cardiac testing.  [name] MD, [facility] 06/11/2014, 10:40 AM

## 2014-06-11 NOTE — Interval H&P Note (Signed)
History and Physical Interval Note:  06/11/2014 9:37 AM  John Choi  has presented today for surgery, with the diagnosis of cad/abnormal mioview  The various methods of treatment have been discussed with the patient and family. After consideration of risks, benefits and other options for treatment, the patient has consented to  Procedure(s): LEFT HEART CATHETERIZATION WITH CORONARY ANGIOGRAM (N/A) as a surgical intervention .  The patient's history has been reviewed, patient examined, no change in status, stable for surgery.  I have reviewed the patient's chart and labs.  Questions were answered to the patient's satisfaction.    Cath Lab Visit (complete for each Cath Lab visit)  Clinical Evaluation Leading to the Procedure:   ACS: No.  Non-ACS:    Anginal Classification: CCS III  Anti-ischemic medical therapy: Maximal Therapy (2 or more classes of medications)  Non-Invasive Test Results: Low-risk stress test findings: cardiac mortality <1%/year  Prior CABG: No previous CABG       John Choi

## 2014-06-11 NOTE — Discharge Instructions (Signed)
Radial Site Care °Refer to this sheet in the next few weeks. These instructions provide you with information on caring for yourself after your procedure. Your caregiver may also give you more specific instructions. Your treatment has been planned according to current medical practices, but problems sometimes occur. Call your caregiver if you have any problems or questions after your procedure. °HOME CARE INSTRUCTIONS °· You may shower the day after the procedure. Remove the bandage (dressing) and gently wash the site with plain soap and water. Gently pat the site dry. °· Do not apply powder or lotion to the site. °· Do not submerge the affected site in water for 3 to 5 days. °· Inspect the site at least twice daily. °· Do not flex or bend the affected arm for 24 hours. °· No lifting over 5 pounds (2.3 kg) for 5 days after your procedure. °· Do not drive home if you are discharged the same day of the procedure. Have someone else drive you. °· You may drive 24 hours after the procedure unless otherwise instructed by your caregiver. °· Do not operate machinery or power tools for 24 hours. °· A responsible adult should be with you for the first 24 hours after you arrive home. °What to expect: °· Any bruising will usually fade within 1 to 2 weeks. °· Blood that collects in the tissue (hematoma) may be painful to the touch. It should usually decrease in size and tenderness within 1 to 2 weeks. °SEEK IMMEDIATE MEDICAL CARE IF: °· You have unusual pain at the radial site. °· You have redness, warmth, swelling, or pain at the radial site. °· You have drainage (other than a small amount of blood on the dressing). °· You have chills. °· You have a fever or persistent symptoms for more than 72 hours. °· You have a fever and your symptoms suddenly get worse. °· Your arm becomes pale, cool, tingly, or numb. °· You have heavy bleeding from the site. Hold pressure on the site. °Document Released: 10/10/2010 Document Revised:  11/30/2011 Document Reviewed: 10/10/2010 °[employer]® Patient Information ©2015 [employer]. This information is not intended to replace advice given to you by your health care provider. Make sure you discuss any questions you have with your health care provider. ° °

## 2014-06-15 ENCOUNTER — Encounter: Payer: Medicare Other | Admitting: Vascular Surgery

## 2014-06-19 ENCOUNTER — Other Ambulatory Visit: Payer: Self-pay

## 2014-06-19 DIAGNOSIS — I714 Abdominal aortic aneurysm, without rupture, unspecified: Secondary | ICD-10-CM

## 2014-06-26 ENCOUNTER — Ambulatory Visit (INDEPENDENT_AMBULATORY_CARE_PROVIDER_SITE_OTHER)
Admission: RE | Admit: 2014-06-26 | Discharge: 2014-06-26 | Disposition: A | Discharge status: Home / Self Care | Payer: Medicare Other | Source: Ambulatory Visit | Attending: Cardiovascular Disease | Admitting: Cardiovascular Disease

## 2014-06-26 DIAGNOSIS — I714 Abdominal aortic aneurysm, without rupture, unspecified: Secondary | ICD-10-CM

## 2014-06-26 DIAGNOSIS — N200 Calculus of kidney: Secondary | ICD-10-CM | POA: Diagnosis not present

## 2014-06-26 DIAGNOSIS — K8020 Calculus of gallbladder without cholecystitis without obstruction: Secondary | ICD-10-CM | POA: Diagnosis not present

## 2014-06-26 MED ORDER — IOHEXOL 350 MG/ML SOLN
100.0000 mL | Freq: Once | INTRAVENOUS | Status: AC | PRN
  Administered 2014-06-26: 100 mL via INTRAVENOUS

## 2014-07-02 ENCOUNTER — Encounter: Payer: Medicare Other | Admitting: Surgery

## 2014-07-03 ENCOUNTER — Encounter: Payer: Self-pay | Admitting: Surgery

## 2014-07-04 ENCOUNTER — Other Ambulatory Visit: Payer: Self-pay

## 2014-07-04 ENCOUNTER — Ambulatory Visit (INDEPENDENT_AMBULATORY_CARE_PROVIDER_SITE_OTHER): Payer: Medicare Other | Admitting: Surgery

## 2014-07-04 ENCOUNTER — Encounter: Payer: Self-pay | Admitting: Surgery

## 2014-07-04 VITALS — BP 131/88 | HR 87 | Resp 14 | Ht 72.0 in | Wt 187.0 lb

## 2014-07-04 DIAGNOSIS — I714 Abdominal aortic aneurysm, without rupture, unspecified: Secondary | ICD-10-CM

## 2014-07-04 DIAGNOSIS — Z01818 Encounter for other preprocedural examination: Secondary | ICD-10-CM | POA: Diagnosis not present

## 2014-07-04 DIAGNOSIS — I209 Angina pectoris, unspecified: Secondary | ICD-10-CM | POA: Diagnosis not present

## 2014-07-04 NOTE — Progress Notes (Signed)
Patient name: John Choi MRN: [id number] DOB: 01/29/1951 Sex: male   Referred by: Dr. Burt Knack  Reason for referral:  Chief Complaint  Patient presents with  . New Evaluation    AAA referred by Dr Ezzie Dural    HISTORY OF PRESENT ILLNESS: The patient comes in today for evaluation of an abdominal aortic aneurysm.  This has been followed with ultrasound and recently showed maximum diameter of 5.4 cm.  He also has bilateral iliac aneurysms.  When the patient saw Dr. Burt Knack he was complaining of pain in his left upper chest that radiated to his left arm and neck and was exacerbated by activity.he underwent cardiac catheterization which revealed widely patent coronary arteries and normal LV function.  He is on aspirin for antiplatelet therapy.  The patient is medically managed for hypercholesterolemia with a statin.  He is also taking medication for blood pressure control.  The patient is a long-time smoker but has cutback to 2 cigarettes per day.  He suffers from COPD and does use an inhaler.  Patient denies any abdominal pain related to his aneurysm.  He does have a midline abdominal wall hernia which is tender.  He also has some right testicular pain.  He has had an ultrasound in the past which showed a cyst.  Past Medical History  Diagnosis Date  . Hypertension   . Asthma   . Kidney stones   . Head trauma     Past Surgical History  Procedure Laterality Date  . Back surgery    . Breast surgery      History   Social History  . Marital Status: Divorced    Spouse Name: N/A    Number of Children: N/A  . Years of Education: N/A   Occupational History  . Not on file.   Social History Main Topics  . Smoking status: Current Every Day Smoker -- 1.50 packs/day  . Smokeless tobacco: Never Used  . Alcohol Use: Yes     Comment: 2 beers a month  . Drug Use: No  . Sexual Activity: Not on file   Other Topics Concern  . Not on file   Social History Narrative  . No  narrative on file    Family History  Problem Relation Age of Onset  . Heart disease Mother   . Heart attack Father   . Stroke Father   . Heart disease Father   . Cancer Sister   . Heart disease Sister   . Cancer Brother   . Stroke Brother     Allergies as of 07/04/2014  . (No Known Allergies)    Current Outpatient Prescriptions on File Prior to Visit  Medication Sig Dispense Refill  . albuterol (PROVENTIL HFA;VENTOLIN HFA) 108 (90 BASE) MCG/ACT inhaler Inhale 2 puffs into the lungs every 6 (six) hours as needed for wheezing or shortness of breath.      Marland Kitchen amLODipine (NORVASC) 10 MG tablet Take 10 mg by mouth at bedtime.       Marland Kitchen aspirin EC 81 MG tablet Take 81 mg by mouth at bedtime.       . metoprolol succinate (TOPROL-XL) 25 MG 24 hr tablet Take 12.5 mg by mouth at bedtime.      . simvastatin (ZOCOR) 20 MG tablet Take 20 mg by mouth at bedtime.         Current Facility-Administered Medications on File Prior to Visit  Medication Dose Route Frequency Provider Last Rate Last Dose  .  diazepam (VALIUM) tablet 5 mg  5 mg Oral Once [name], MD         REVIEW OF SYSTEMS: Cardiovascular: No chest pain, chest pressure, palpitations, orthopnea. No claudication or rest pain,  No history of DVT or phlebitis. Pulmonary: No productive cough or wheezing.positive for asthma and shortness of breath with exertion Neurologic: No weakness, paresthesias, aphasia, or amaurosis. No dizziness. Hematologic: No bleeding problems or clotting disorders. Musculoskeletal: No joint pain or joint swelling. Gastrointestinal: No blood in stool or hematemesis Genitourinary: No dysuria or hematuria. Psychiatric:: No history of major depression. Integumentary: No rashes or ulcers. Constitutional: No fever or chills.  PHYSICAL EXAMINATION: General: The patient appears their stated age.  Vital signs are BP 131/88  Pulse 87  Resp 14  Ht 6' (1.829 m)  Wt 187 lb (84.823 kg)  BMI 25.36 kg/m2 HEENT:   No gross abnormalities Pulmonary: Respirations are non-labored Abdomen: Soft and non-tender .  Midline abdominal hernia which is tender.  No incarcerated or strangulated bowel.  Aorta is nontender Musculoskeletal: There are no major deformities.   Neurologic: No focal weakness or paresthesias are detected, Skin: There are no ulcer or rashes noted. Psychiatric: The patient has normal affect. Cardiovascular: There is a regular rate and rhythm without significant murmur appreciated.no carotid bruits.  Palpable pedal pulses  Diagnostic Studies: I have reviewed his CT angiogram which showsa 5.7 cm infrarenal abdominal aortic aneurysm with aneurysmal changes of the left common iliac artery at 22 mm and right common iliac artery at 17 mm    Assessment:  Abdominal aortic aneurysm Plan: Based on the patient's anatomy, I feel he is a good candidate for endovascular repair.  I described the details of the procedure, as well as the potential risks, including but not limited torenal failure, intestinal ischemia, lower extremity ischemia, cardiopulmonary complications, and death.  We discussed the risks of rupture if this is not repaired.  The patient is here to have this fixed.  I have scheduled him for percutaneous endovascular aneurysm repair on Friday, October 30.  The patient will get carotid Dopplers to evaluate for carotid stenosis as well as lower extremity duplex to evaluate for popliteal aneurysm, prior to his surgery     V. Leia Alf, M.D. [facility] [place] Office: [phone] Pager:  [phone]

## 2014-07-04 NOTE — Addendum Note (Signed)
Addended by: Mena Goes on: 07/04/2014 11:02 AM   Modules accepted: Orders

## 2014-07-11 ENCOUNTER — Encounter (HOSPITAL_COMMUNITY): Payer: Self-pay | Admitting: Pharmacy Technician

## 2014-07-12 ENCOUNTER — Encounter (HOSPITAL_COMMUNITY): Payer: Self-pay

## 2014-07-12 ENCOUNTER — Encounter (HOSPITAL_COMMUNITY)
Admission: RE | Admit: 2014-07-12 | Discharge: 2014-07-12 | Disposition: A | Discharge status: Home / Self Care | Payer: Medicare Other | Source: Ambulatory Visit | Attending: Surgery | Admitting: Surgery

## 2014-07-12 DIAGNOSIS — R079 Chest pain, unspecified: Secondary | ICD-10-CM | POA: Diagnosis not present

## 2014-07-12 DIAGNOSIS — E785 Hyperlipidemia, unspecified: Secondary | ICD-10-CM | POA: Diagnosis not present

## 2014-07-12 DIAGNOSIS — I714 Abdominal aortic aneurysm, without rupture: Secondary | ICD-10-CM | POA: Diagnosis not present

## 2014-07-12 DIAGNOSIS — Z01818 Encounter for other preprocedural examination: Secondary | ICD-10-CM | POA: Diagnosis not present

## 2014-07-12 LAB — COMPREHENSIVE METABOLIC PANEL
ALBUMIN: 3.7 g/dL (ref 3.5–5.2)
ALK PHOS: 114 U/L (ref 39–117)
ALT: 15 U/L (ref 0–53)
AST: 19 U/L (ref 0–37)
Anion gap: 14 (ref 5–15)
BILIRUBIN TOTAL: 0.3 mg/dL (ref 0.3–1.2)
BUN: 18 mg/dL (ref 6–23)
CHLORIDE: 104 meq/L (ref 96–112)
CO2: 19 mEq/L (ref 19–32)
Calcium: 9.4 mg/dL (ref 8.4–10.5)
Creatinine, Ser: 1.24 mg/dL (ref 0.50–1.35)
GFR calc Af Amer: 70 mL/min — ABNORMAL LOW (ref >90)
GFR calc non Af Amer: 60 mL/min — ABNORMAL LOW (ref >90)
Glucose, Bld: 108 mg/dL — ABNORMAL HIGH (ref 70–99)
POTASSIUM: 4.5 meq/L (ref 3.7–5.3)
SODIUM: 137 meq/L (ref 137–147)
Total Protein: 7.9 g/dL (ref 6.0–8.3)

## 2014-07-12 LAB — TYPE AND SCREEN
ABO/RH(D): A POS
ANTIBODY SCREEN: NEGATIVE

## 2014-07-12 LAB — URINALYSIS, ROUTINE W REFLEX MICROSCOPIC
Bilirubin Urine: NEGATIVE
Glucose, UA: NEGATIVE mg/dL
Ketones, ur: NEGATIVE mg/dL
Leukocytes, UA: NEGATIVE
NITRITE: NEGATIVE
PROTEIN: NEGATIVE mg/dL
SPECIFIC GRAVITY, URINE: 1.011 (ref 1.005–1.030)
UROBILINOGEN UA: 0.2 mg/dL (ref 0.0–1.0)
pH: 6 (ref 5.0–8.0)

## 2014-07-12 LAB — ABO/RH: ABO/RH(D): A POS

## 2014-07-12 LAB — CBC
HEMATOCRIT: 46.5 % (ref 39.0–52.0)
Hemoglobin: 15.8 g/dL (ref 13.0–17.0)
MCH: 32.1 pg (ref 26.0–34.0)
MCHC: 34 g/dL (ref 30.0–36.0)
MCV: 94.5 fL (ref 78.0–100.0)
Platelets: 228 10*3/uL (ref 150–400)
RBC: 4.92 MIL/uL (ref 4.22–5.81)
RDW: 13.6 % (ref 11.5–15.5)
WBC: 7.4 10*3/uL (ref 4.0–10.5)

## 2014-07-12 LAB — BLOOD GAS, ARTERIAL
Acid-base deficit: 1.3 mmol/L (ref 0.0–2.0)
Bicarbonate: 22.8 mEq/L (ref 20.0–24.0)
Drawn by: 206361
FIO2: 0.21 %
O2 Saturation: 94.5 %
PCO2 ART: 37.8 mmHg (ref 35.0–45.0)
PH ART: 7.398 (ref 7.350–7.450)
Patient temperature: 98.6
TCO2: 24 mmol/L (ref 0–100)
pO2, Arterial: 68.5 mmHg — ABNORMAL LOW (ref 80.0–100.0)

## 2014-07-12 LAB — URINE MICROSCOPIC-ADD ON

## 2014-07-12 LAB — SURGICAL PCR SCREEN
MRSA, PCR: NEGATIVE
STAPHYLOCOCCUS AUREUS: NEGATIVE

## 2014-07-12 LAB — PROTIME-INR
INR: 0.95 (ref 0.00–1.49)
Prothrombin Time: 12.7 seconds (ref 11.6–15.2)

## 2014-07-12 LAB — APTT: aPTT: 30 seconds (ref 24–37)

## 2014-07-12 NOTE — Progress Notes (Signed)
07/12/14 0922  OBSTRUCTIVE SLEEP APNEA  Have you ever been diagnosed with sleep apnea through a sleep study? No  Do you snore loudly (loud enough to be heard through closed doors)?  1  Do you often feel tired, fatigued, or sleepy during the daytime? 1  Has anyone observed you stop breathing during your sleep? 0  Do you have, or are you being treated for high blood pressure? 1  BMI more than 35 kg/m2? 0  Age over 63 years old? 1  Neck circumference greater than 40 cm/16 inches? 1  Gender: 1  Obstructive Sleep Apnea Score 6  Score 4 or greater  Results sent to PCP

## 2014-07-12 NOTE — Pre-Procedure Instructions (Signed)
GEAROLD WAINER  07/12/2014   Your procedure is scheduled on: Friday, Oct. 30th   Report to [facility] Admitting at  5:30 AM.   Call this number if you have problems the morning of surgery: [phone]   Remember:   Do not eat food or drink liquids after midnight Thursday.   Take these medicines the morning of surgery with A SIP OF WATER: None.  Please use inhaler   Do not wear jewelry - no rings or watches.  Do not wear lotions or colognes. You may NOT wear deodorant the morning of surgery.   Men may shave face and neck.   Do not bring valuables to the hospital.  [facility] is not responsible for any belongings or valuables.               Contacts, dentures or bridgework may not be worn into surgery.  Leave suitcase in the car. After surgery it may be brought to your room.  For patients admitted to the hospital, discharge time is determined by your treatment team.    Name and phone number of your driver:    Special Instructions: "Preparing for Surgery" instruction sheet.   Please read over the following fact sheets that you were given: Pain Booklet, Coughing and Deep Breathing, Blood Transfusion Information, MRSA Information and Surgical Site Infection Prevention

## 2014-07-12 NOTE — Pre-Procedure Instructions (Signed)
John Choi  07/12/2014   Your procedure is scheduled on:  Friday, Oct. 30  Report to [facility] Main Entrance "A" at 5:30 AM.  Call this number if you have problems the morning of surgery: [phone]               If you have any questions prior to the surgery date contact pre-admission at [phone]   Remember:   Do not eat food or drink liquids after midnight.   Take these medicines the morning of surgery with A SIP OF WATER: albuterol if needed (bring to hospital), amlodipine (norvasc), aspirin, metoprolol (toprol XL)   Do not wear jewelry, make-up or nail polish.  Do not wear lotions, powders, or perfumes. You may wear deodorant.  Do not shave 48 hours prior to surgery. Men may shave face and neck.  Do not bring valuables to the hospital.  [facility] is not responsible                  for any belongings or valuables.               Contacts, dentures or bridgework may not be worn into surgery.  Leave suitcase in the car. After surgery it may be brought to your room.  For patients admitted to the hospital, discharge time is determined by your                treatment team.               Patients discharged the day of surgery will not be allowed to drive  home.  Name and phone number of your driver:   Special Instructions: review preparing for surgery handout  Please read over the following fact sheets that you were given: Pain Booklet, Coughing and Deep Breathing, Blood Transfusion Information, MRSA Information and Surgical Site Infection Prevention

## 2014-07-13 ENCOUNTER — Ambulatory Visit (HOSPITAL_COMMUNITY)
Admission: RE | Admit: 2014-07-13 | Discharge: 2014-07-13 | Disposition: A | Discharge status: Home / Self Care | Payer: Medicare Other | Source: Ambulatory Visit | Attending: Vascular Surgery | Admitting: Vascular Surgery

## 2014-07-13 ENCOUNTER — Ambulatory Visit (INDEPENDENT_AMBULATORY_CARE_PROVIDER_SITE_OTHER)
Admission: RE | Admit: 2014-07-13 | Discharge: 2014-07-13 | Disposition: A | Discharge status: Home / Self Care | Payer: Medicare Other | Source: Ambulatory Visit | Attending: Vascular Surgery | Admitting: Vascular Surgery

## 2014-07-13 DIAGNOSIS — Z01818 Encounter for other preprocedural examination: Secondary | ICD-10-CM

## 2014-07-13 DIAGNOSIS — I714 Abdominal aortic aneurysm, without rupture, unspecified: Secondary | ICD-10-CM

## 2014-07-13 DIAGNOSIS — R079 Chest pain, unspecified: Secondary | ICD-10-CM | POA: Insufficient documentation

## 2014-07-13 DIAGNOSIS — E785 Hyperlipidemia, unspecified: Secondary | ICD-10-CM | POA: Insufficient documentation

## 2014-07-19 MED ORDER — SODIUM CHLORIDE 0.9 % IV SOLN: INTRAVENOUS | Status: DC

## 2014-07-19 MED ORDER — CEFUROXIME SODIUM 1.5 G IJ SOLR
1.5000 g | INTRAMUSCULAR | Status: AC
  Administered 2014-07-20: 1.5 g via INTRAVENOUS
  Filled 2014-07-19: qty 1.5

## 2014-07-20 ENCOUNTER — Inpatient Hospital Stay (HOSPITAL_COMMUNITY): Payer: Medicare Other

## 2014-07-20 ENCOUNTER — Encounter (HOSPITAL_COMMUNITY): Payer: Medicare Other | Admitting: Certified Registered Nurse Anesthetist

## 2014-07-20 ENCOUNTER — Inpatient Hospital Stay (HOSPITAL_COMMUNITY)
Admission: RE | Admit: 2014-07-20 | Discharge: 2014-07-21 | DRG: 269 | Disposition: A | Discharge status: Home / Self Care | Payer: Medicare Other | Source: Ambulatory Visit | Attending: Surgery | Admitting: Surgery

## 2014-07-20 ENCOUNTER — Encounter (HOSPITAL_COMMUNITY): Payer: Self-pay | Admitting: *Deleted

## 2014-07-20 ENCOUNTER — Encounter (HOSPITAL_COMMUNITY)
Admission: RE | Disposition: A | Discharge status: Home / Self Care | Payer: Medicare Other | Source: Ambulatory Visit | Attending: Surgery

## 2014-07-20 ENCOUNTER — Other Ambulatory Visit: Payer: Self-pay | Admitting: *Deleted

## 2014-07-20 ENCOUNTER — Inpatient Hospital Stay (HOSPITAL_COMMUNITY): Payer: Medicare Other | Admitting: Certified Registered Nurse Anesthetist

## 2014-07-20 DIAGNOSIS — J45909 Unspecified asthma, uncomplicated: Secondary | ICD-10-CM | POA: Diagnosis present

## 2014-07-20 DIAGNOSIS — I714 Abdominal aortic aneurysm, without rupture, unspecified: Secondary | ICD-10-CM

## 2014-07-20 DIAGNOSIS — F17210 Nicotine dependence, cigarettes, uncomplicated: Secondary | ICD-10-CM | POA: Diagnosis present

## 2014-07-20 DIAGNOSIS — I723 Aneurysm of iliac artery: Secondary | ICD-10-CM | POA: Diagnosis present

## 2014-07-20 DIAGNOSIS — Z79899 Other long term (current) drug therapy: Secondary | ICD-10-CM | POA: Diagnosis not present

## 2014-07-20 DIAGNOSIS — Z8679 Personal history of other diseases of the circulatory system: Secondary | ICD-10-CM

## 2014-07-20 DIAGNOSIS — Z9889 Other specified postprocedural states: Secondary | ICD-10-CM

## 2014-07-20 DIAGNOSIS — Z48812 Encounter for surgical aftercare following surgery on the circulatory system: Secondary | ICD-10-CM

## 2014-07-20 DIAGNOSIS — J449 Chronic obstructive pulmonary disease, unspecified: Secondary | ICD-10-CM | POA: Diagnosis present

## 2014-07-20 DIAGNOSIS — I10 Essential (primary) hypertension: Secondary | ICD-10-CM | POA: Diagnosis present

## 2014-07-20 DIAGNOSIS — E780 Pure hypercholesterolemia: Secondary | ICD-10-CM | POA: Diagnosis present

## 2014-07-20 DIAGNOSIS — Z452 Encounter for adjustment and management of vascular access device: Secondary | ICD-10-CM | POA: Diagnosis not present

## 2014-07-20 HISTORY — PX: ABDOMINAL AORTIC ENDOVASCULAR STENT GRAFT: SHX5707

## 2014-07-20 LAB — BASIC METABOLIC PANEL
Anion gap: 11 (ref 5–15)
BUN: 20 mg/dL (ref 6–23)
CO2: 23 mEq/L (ref 19–32)
Calcium: 8.5 mg/dL (ref 8.4–10.5)
Chloride: 105 mEq/L (ref 96–112)
Creatinine, Ser: 1.4 mg/dL — ABNORMAL HIGH (ref 0.50–1.35)
GFR calc Af Amer: 60 mL/min — ABNORMAL LOW (ref >90)
GFR calc non Af Amer: 52 mL/min — ABNORMAL LOW (ref >90)
GLUCOSE: 102 mg/dL — AB (ref 70–99)
POTASSIUM: 4.4 meq/L (ref 3.7–5.3)
Sodium: 139 mEq/L (ref 137–147)

## 2014-07-20 LAB — CBC
HCT: 38.2 % — ABNORMAL LOW (ref 39.0–52.0)
HEMOGLOBIN: 13.2 g/dL (ref 13.0–17.0)
MCH: 32.1 pg (ref 26.0–34.0)
MCHC: 34.6 g/dL (ref 30.0–36.0)
MCV: 92.9 fL (ref 78.0–100.0)
Platelets: 206 10*3/uL (ref 150–400)
RBC: 4.11 MIL/uL — ABNORMAL LOW (ref 4.22–5.81)
RDW: 13.8 % (ref 11.5–15.5)
WBC: 9.5 10*3/uL (ref 4.0–10.5)

## 2014-07-20 LAB — PROTIME-INR
INR: 1.06 (ref 0.00–1.49)
Prothrombin Time: 13.9 seconds (ref 11.6–15.2)

## 2014-07-20 LAB — APTT: aPTT: 31 seconds (ref 24–37)

## 2014-07-20 LAB — MAGNESIUM: Magnesium: 1.9 mg/dL (ref 1.5–2.5)

## 2014-07-20 SURGERY — INSERTION, ENDOVASCULAR STENT GRAFT, AORTA, ABDOMINAL
Anesthesia: General | Site: Abdomen

## 2014-07-20 MED ORDER — LACTATED RINGERS IV SOLN
INTRAVENOUS | Status: DC | PRN
  Administered 2014-07-20 (×2): via INTRAVENOUS

## 2014-07-20 MED ORDER — DOCUSATE SODIUM 100 MG PO CAPS: 100.0000 mg | ORAL_CAPSULE | Freq: Every day | ORAL | Status: DC

## 2014-07-20 MED ORDER — POTASSIUM CHLORIDE CRYS ER 20 MEQ PO TBCR: 20.0000 meq | EXTENDED_RELEASE_TABLET | Freq: Every day | ORAL | Status: DC | PRN

## 2014-07-20 MED ORDER — MIDAZOLAM HCL 2 MG/2ML IJ SOLN
INTRAMUSCULAR | Status: AC
  Filled 2014-07-20: qty 2

## 2014-07-20 MED ORDER — ALUM & MAG HYDROXIDE-SIMETH 200-200-20 MG/5ML PO SUSP
15.0000 mL | ORAL | Status: DC | PRN
  Administered 2014-07-21: 30 mL via ORAL
  Filled 2014-07-20: qty 30

## 2014-07-20 MED ORDER — ONDANSETRON HCL 4 MG/2ML IJ SOLN
INTRAMUSCULAR | Status: DC | PRN
  Administered 2014-07-20: 4 mg via INTRAVENOUS

## 2014-07-20 MED ORDER — OXYCODONE HCL 5 MG PO TABS: 5.0000 mg | ORAL_TABLET | ORAL | Status: DC | PRN

## 2014-07-20 MED ORDER — HEPARIN SODIUM (PORCINE) 1000 UNIT/ML IJ SOLN
INTRAMUSCULAR | Status: AC
  Filled 2014-07-20: qty 1

## 2014-07-20 MED ORDER — MIDAZOLAM HCL 5 MG/5ML IJ SOLN
INTRAMUSCULAR | Status: DC | PRN
  Administered 2014-07-20: 2 mg via INTRAVENOUS

## 2014-07-20 MED ORDER — PROPOFOL 10 MG/ML IV BOLUS
INTRAVENOUS | Status: DC | PRN
  Administered 2014-07-20: 110 mg via INTRAVENOUS

## 2014-07-20 MED ORDER — ONDANSETRON HCL 4 MG/2ML IJ SOLN
4.0000 mg | Freq: Four times a day (QID) | INTRAMUSCULAR | Status: DC | PRN
Start: 2014-07-20 — End: 2014-07-21

## 2014-07-20 MED ORDER — METOPROLOL SUCCINATE 12.5 MG HALF TABLET
12.5000 mg | ORAL_TABLET | Freq: Every day | ORAL | Status: DC
  Administered 2014-07-20: 12.5 mg via ORAL
  Filled 2014-07-20 (×2): qty 1

## 2014-07-20 MED ORDER — ONDANSETRON HCL 4 MG/2ML IJ SOLN
INTRAMUSCULAR | Status: AC
  Filled 2014-07-20: qty 2

## 2014-07-20 MED ORDER — ONDANSETRON HCL 4 MG/2ML IJ SOLN: 4.0000 mg | Freq: Once | INTRAMUSCULAR | Status: DC | PRN

## 2014-07-20 MED ORDER — HYDROMORPHONE HCL 1 MG/ML IJ SOLN: 0.2500 mg | INTRAMUSCULAR | Status: DC | PRN

## 2014-07-20 MED ORDER — ACETAMINOPHEN 325 MG PO TABS: 325.0000 mg | ORAL_TABLET | ORAL | Status: DC | PRN

## 2014-07-20 MED ORDER — FENTANYL CITRATE 0.05 MG/ML IJ SOLN
INTRAMUSCULAR | Status: AC
  Filled 2014-07-20: qty 5

## 2014-07-20 MED ORDER — ARTIFICIAL TEARS OP OINT
TOPICAL_OINTMENT | OPHTHALMIC | Status: DC | PRN
  Administered 2014-07-20: 1 via OPHTHALMIC

## 2014-07-20 MED ORDER — ACETAMINOPHEN 650 MG RE SUPP: 325.0000 mg | RECTAL | Status: DC | PRN

## 2014-07-20 MED ORDER — PROPOFOL 10 MG/ML IV BOLUS
INTRAVENOUS | Status: AC
  Filled 2014-07-20: qty 20

## 2014-07-20 MED ORDER — OXYCODONE HCL 5 MG PO TABS: 5.0000 mg | ORAL_TABLET | Freq: Four times a day (QID) | ORAL | Status: DC | PRN

## 2014-07-20 MED ORDER — CHLORHEXIDINE GLUCONATE CLOTH 2 % EX PADS: 6.0000 | MEDICATED_PAD | Freq: Once | CUTANEOUS | Status: DC

## 2014-07-20 MED ORDER — SODIUM CHLORIDE 0.9 % IR SOLN
Status: DC | PRN
  Administered 2014-07-20: 07:00:00

## 2014-07-20 MED ORDER — GUAIFENESIN-DM 100-10 MG/5ML PO SYRP: 15.0000 mL | ORAL_SOLUTION | ORAL | Status: DC | PRN

## 2014-07-20 MED ORDER — MAGNESIUM SULFATE 40 MG/ML IJ SOLN: 2.0000 g | Freq: Every day | INTRAMUSCULAR | Status: DC | PRN

## 2014-07-20 MED ORDER — 0.9 % SODIUM CHLORIDE (POUR BTL) OPTIME
TOPICAL | Status: DC | PRN
  Administered 2014-07-20: 1000 mL

## 2014-07-20 MED ORDER — IODIXANOL 320 MG/ML IV SOLN
INTRAVENOUS | Status: DC | PRN
  Administered 2014-07-20: 47 mL via INTRAVENOUS

## 2014-07-20 MED ORDER — PANTOPRAZOLE SODIUM 40 MG PO TBEC
40.0000 mg | DELAYED_RELEASE_TABLET | Freq: Every day | ORAL | Status: DC
  Administered 2014-07-20: 40 mg via ORAL
  Filled 2014-07-20: qty 1

## 2014-07-20 MED ORDER — DEXTROSE 5 % IV SOLN
1.5000 g | Freq: Two times a day (BID) | INTRAVENOUS | Status: AC
  Administered 2014-07-20 – 2014-07-21 (×2): 1.5 g via INTRAVENOUS
  Filled 2014-07-20 (×2): qty 1.5

## 2014-07-20 MED ORDER — PROTAMINE SULFATE 10 MG/ML IV SOLN
INTRAVENOUS | Status: AC
  Filled 2014-07-20: qty 5

## 2014-07-20 MED ORDER — PHENOL 1.4 % MT LIQD: 1.0000 | OROMUCOSAL | Status: DC | PRN

## 2014-07-20 MED ORDER — ROCURONIUM BROMIDE 50 MG/5ML IV SOLN
INTRAVENOUS | Status: AC
  Filled 2014-07-20: qty 1

## 2014-07-20 MED ORDER — DOPAMINE-DEXTROSE 3.2-5 MG/ML-% IV SOLN: 3.0000 ug/kg/min | INTRAVENOUS | Status: DC | PRN

## 2014-07-20 MED ORDER — DIAZEPAM 5 MG PO TABS
5.0000 mg | ORAL_TABLET | Freq: Once | ORAL | Status: DC
Start: 2014-07-20 — End: 2014-07-20

## 2014-07-20 MED ORDER — HEPARIN SODIUM (PORCINE) 1000 UNIT/ML IJ SOLN
INTRAMUSCULAR | Status: DC | PRN
  Administered 2014-07-20: 8 mL via INTRAVENOUS

## 2014-07-20 MED ORDER — PHENYLEPHRINE HCL 10 MG/ML IJ SOLN
10.0000 mg | INTRAVENOUS | Status: DC | PRN
  Administered 2014-07-20: 15 ug/min via INTRAVENOUS

## 2014-07-20 MED ORDER — FENTANYL CITRATE 0.05 MG/ML IJ SOLN
INTRAMUSCULAR | Status: DC | PRN
  Administered 2014-07-20 (×3): 50 ug via INTRAVENOUS

## 2014-07-20 MED ORDER — MORPHINE SULFATE 2 MG/ML IJ SOLN
2.0000 mg | INTRAMUSCULAR | Status: DC | PRN
  Administered 2014-07-21: 2 mg via INTRAVENOUS
  Filled 2014-07-20: qty 1

## 2014-07-20 MED ORDER — LACTATED RINGERS IV SOLN
INTRAVENOUS | Status: DC | PRN
  Administered 2014-07-20 (×2): via INTRAVENOUS

## 2014-07-20 MED ORDER — GLYCOPYRROLATE 0.2 MG/ML IJ SOLN
INTRAMUSCULAR | Status: DC | PRN
  Administered 2014-07-20: .6 mg via INTRAVENOUS
  Administered 2014-07-20: 0.2 mg via INTRAVENOUS

## 2014-07-20 MED ORDER — BISACODYL 10 MG RE SUPP: 10.0000 mg | Freq: Every day | RECTAL | Status: DC | PRN

## 2014-07-20 MED ORDER — ENOXAPARIN SODIUM 30 MG/0.3ML ~~LOC~~ SOLN
30.0000 mg | SUBCUTANEOUS | Status: DC
  Filled 2014-07-20: qty 0.3

## 2014-07-20 MED ORDER — PNEUMOCOCCAL VAC POLYVALENT 25 MCG/0.5ML IJ INJ
0.5000 mL | INJECTION | INTRAMUSCULAR | Status: DC
  Filled 2014-07-20: qty 0.5

## 2014-07-20 MED ORDER — PROTAMINE SULFATE 10 MG/ML IV SOLN
INTRAVENOUS | Status: DC | PRN
  Administered 2014-07-20: 10 mg via INTRAVENOUS
  Administered 2014-07-20 (×2): 20 mg via INTRAVENOUS

## 2014-07-20 MED ORDER — ROCURONIUM BROMIDE 100 MG/10ML IV SOLN
INTRAVENOUS | Status: DC | PRN
  Administered 2014-07-20: 5 mg via INTRAVENOUS
  Administered 2014-07-20: 10 mg via INTRAVENOUS
  Administered 2014-07-20: 5 mg via INTRAVENOUS
  Administered 2014-07-20: 30 mg via INTRAVENOUS

## 2014-07-20 MED ORDER — NEOSTIGMINE METHYLSULFATE 10 MG/10ML IV SOLN
INTRAVENOUS | Status: DC | PRN
  Administered 2014-07-20: 4 mg via INTRAVENOUS

## 2014-07-20 MED ORDER — LIDOCAINE HCL (CARDIAC) 20 MG/ML IV SOLN
INTRAVENOUS | Status: AC
  Filled 2014-07-20: qty 5

## 2014-07-20 MED ORDER — SODIUM CHLORIDE 0.9 % IV SOLN
INTRAVENOUS | Status: DC
Start: 2014-07-20 — End: 2014-07-21
  Administered 2014-07-20 (×2): via INTRAVENOUS

## 2014-07-20 MED ORDER — SODIUM CHLORIDE 0.9 % IV SOLN: 500.0000 mL | Freq: Once | INTRAVENOUS | Status: AC | PRN

## 2014-07-20 MED ORDER — HYDRALAZINE HCL 20 MG/ML IJ SOLN: 5.0000 mg | INTRAMUSCULAR | Status: DC | PRN

## 2014-07-20 MED ORDER — METOPROLOL TARTRATE 1 MG/ML IV SOLN
2.0000 mg | INTRAVENOUS | Status: DC | PRN
Start: 2014-07-20 — End: 2014-07-21

## 2014-07-20 MED ORDER — AMLODIPINE BESYLATE 10 MG PO TABS
10.0000 mg | ORAL_TABLET | Freq: Every day | ORAL | Status: DC
  Filled 2014-07-20 (×2): qty 1

## 2014-07-20 MED ORDER — ALBUTEROL SULFATE (2.5 MG/3ML) 0.083% IN NEBU
3.0000 mL | INHALATION_SOLUTION | Freq: Four times a day (QID) | RESPIRATORY_TRACT | Status: DC | PRN
  Administered 2014-07-20: 3 mL via RESPIRATORY_TRACT
  Filled 2014-07-20: qty 3

## 2014-07-20 MED ORDER — SIMVASTATIN 20 MG PO TABS
20.0000 mg | ORAL_TABLET | Freq: Every day | ORAL | Status: DC
  Administered 2014-07-20: 20 mg via ORAL
  Filled 2014-07-20 (×2): qty 1

## 2014-07-20 MED ORDER — LABETALOL HCL 5 MG/ML IV SOLN: 10.0000 mg | INTRAVENOUS | Status: DC | PRN

## 2014-07-20 MED ORDER — ASPIRIN EC 81 MG PO TBEC
81.0000 mg | DELAYED_RELEASE_TABLET | Freq: Every day | ORAL | Status: DC
  Administered 2014-07-20: 81 mg via ORAL
  Filled 2014-07-20 (×2): qty 1

## 2014-07-20 MED ORDER — LIDOCAINE HCL (CARDIAC) 20 MG/ML IV SOLN
INTRAVENOUS | Status: DC | PRN
  Administered 2014-07-20: 80 mg via INTRAVENOUS

## 2014-07-20 SURGICAL SUPPLY — 74 items
BAG BANDED W/RUBBER/TAPE 36X54 (MISCELLANEOUS) ×3 IMPLANT
BAG SNAP BAND KOVER 36X36 (MISCELLANEOUS) ×9 IMPLANT
BLADE SURG CLIPPER 3M 9600 (MISCELLANEOUS) ×3 IMPLANT
CANISTER SUCTION 2500CC (MISCELLANEOUS) ×3 IMPLANT
CATH BEACON 5.038 65CM KMP-01 (CATHETERS) ×3 IMPLANT
CATH OMNI FLUSH .035X70CM (CATHETERS) ×3 IMPLANT
CLIP TI MEDIUM 24 (CLIP) IMPLANT
CLIP TI WIDE RED SMALL 24 (CLIP) IMPLANT
COVER DOME SNAP 22 D (MISCELLANEOUS) ×3 IMPLANT
COVER MAYO STAND STRL (DRAPES) ×3 IMPLANT
COVER PROBE W GEL 5X96 (DRAPES) ×3 IMPLANT
COVER SURGICAL LIGHT HANDLE (MISCELLANEOUS) ×3 IMPLANT
DERMABOND ADVANCED (GAUZE/BANDAGES/DRESSINGS) ×2
DERMABOND ADVANCED .7 DNX12 (GAUZE/BANDAGES/DRESSINGS) ×1 IMPLANT
DEVICE CLOSURE PERCLS PRGLD 6F (VASCULAR PRODUCTS) ×4 IMPLANT
DRAPE TABLE COVER HEAVY DUTY (DRAPES) ×3 IMPLANT
DRESSING OPSITE X SMALL 2X3 (GAUZE/BANDAGES/DRESSINGS) ×3 IMPLANT
DRSG TEGADERM 2-3/8X2-3/4 SM (GAUZE/BANDAGES/DRESSINGS) ×3 IMPLANT
DRYSEAL FLEXSHEATH 12FR 33CM (SHEATH) ×2
DRYSEAL FLEXSHEATH 14FR 33CM (SHEATH) ×2
DRYSEAL FLEXSHEATH 16FR 33CM (SHEATH) ×2
ELECT REM PT RETURN 9FT ADLT (ELECTROSURGICAL) ×6
ELECTRODE REM PT RTRN 9FT ADLT (ELECTROSURGICAL) ×2 IMPLANT
EXCLUDER TNK 23X14.5MMX12CM (Endovascular Graft) ×1 IMPLANT
EXCLUDER TRUNK 23X14.5MMX12CM (Endovascular Graft) ×3 IMPLANT
GAUZE SPONGE 2X2 8PLY STRL LF (GAUZE/BANDAGES/DRESSINGS) ×1 IMPLANT
GLOVE BIOGEL PI IND STRL 7.5 (GLOVE) ×1 IMPLANT
GLOVE BIOGEL PI INDICATOR 7.5 (GLOVE) ×2
GLOVE SURG SS PI 7.5 STRL IVOR (GLOVE) ×3 IMPLANT
GOWN STRL REUS W/ TWL LRG LVL3 (GOWN DISPOSABLE) ×2 IMPLANT
GOWN STRL REUS W/ TWL XL LVL3 (GOWN DISPOSABLE) ×1 IMPLANT
GOWN STRL REUS W/TWL LRG LVL3 (GOWN DISPOSABLE) ×4
GOWN STRL REUS W/TWL XL LVL3 (GOWN DISPOSABLE) ×2
GRAFT BALLN CATH 65CM (STENTS) ×1 IMPLANT
HEMOSTAT SNOW SURGICEL 2X4 (HEMOSTASIS) IMPLANT
KIT BASIN OR (CUSTOM PROCEDURE TRAY) ×3 IMPLANT
KIT ROOM TURNOVER OR (KITS) ×3 IMPLANT
LEG CONTRALATERAL 23X10 (Vascular Products) ×3 IMPLANT
LEG CONTRALATERAL 23X12 (Endovascular Graft) ×3 IMPLANT
NEEDLE PERC 18GX7CM (NEEDLE) ×3 IMPLANT
NS IRRIG 1000ML POUR BTL (IV SOLUTION) ×3 IMPLANT
PACK AORTA (CUSTOM PROCEDURE TRAY) ×3 IMPLANT
PAD ARMBOARD 7.5X6 YLW CONV (MISCELLANEOUS) ×6 IMPLANT
PENCIL BUTTON HOLSTER BLD 10FT (ELECTRODE) IMPLANT
PERCLOSE PROGLIDE 6F (VASCULAR PRODUCTS) ×12
PROTECTION STATION PRESSURIZED (MISCELLANEOUS) ×3
SHEATH AVANTI 11CM 8FR (MISCELLANEOUS) ×3 IMPLANT
SHEATH BRITE TIP 8FR 23CM (MISCELLANEOUS) ×3 IMPLANT
SHEATH DRYSEAL FLEX 12FR 33CM (SHEATH) ×1 IMPLANT
SHEATH DRYSEAL FLEX 14FR 33CM (SHEATH) ×1 IMPLANT
SHEATH DRYSEAL FLEX 16FR 33CM (SHEATH) ×1 IMPLANT
SHIELD RADPAD SCOOP 12X17 (MISCELLANEOUS) ×6 IMPLANT
SPONGE GAUZE 2X2 STER 10/PKG (GAUZE/BANDAGES/DRESSINGS) ×2
STATION PROTECTION PRESSURIZED (MISCELLANEOUS) ×1 IMPLANT
STENT GRAFT BALLN CATH 65CM (STENTS) ×2
STOPCOCK MORSE 400PSI 3WAY (MISCELLANEOUS) ×3 IMPLANT
SUT ETHILON 3 0 PS 1 (SUTURE) IMPLANT
SUT PROLENE 5 0 C 1 24 (SUTURE) IMPLANT
SUT VIC AB 2-0 CT1 27 (SUTURE)
SUT VIC AB 2-0 CT1 TAPERPNT 27 (SUTURE) IMPLANT
SUT VIC AB 3-0 SH 27 (SUTURE)
SUT VIC AB 3-0 SH 27X BRD (SUTURE) IMPLANT
SUT VICRYL 4-0 PS2 18IN ABS (SUTURE) ×6 IMPLANT
SYR 20CC LL (SYRINGE) ×6 IMPLANT
SYR 30ML LL (SYRINGE) IMPLANT
SYR 5ML LL (SYRINGE) IMPLANT
SYR MEDRAD MARK V 150ML (SYRINGE) ×3 IMPLANT
SYRINGE 10CC LL (SYRINGE) ×9 IMPLANT
TOWEL OR 17X24 6PK STRL BLUE (TOWEL DISPOSABLE) ×6 IMPLANT
TOWEL OR 17X26 10 PK STRL BLUE (TOWEL DISPOSABLE) ×6 IMPLANT
TRAY FOLEY CATH 16FRSI W/METER (SET/KITS/TRAYS/PACK) ×3 IMPLANT
TUBING HIGH PRESSURE 120CM (CONNECTOR) ×3 IMPLANT
WIRE AMPLATZ SS-J .035X180CM (WIRE) ×6 IMPLANT
WIRE BENTSON .035X145CM (WIRE) ×6 IMPLANT

## 2014-07-20 NOTE — Interval H&P Note (Signed)
History and Physical Interval Note:  07/20/2014 7:23 AM  John Choi  has presented today for surgery, with the diagnosis of Aneurysm, abdominal aortic   The various methods of treatment have been discussed with the patient and family. After consideration of risks, benefits and other options for treatment, the patient has consented to  Procedure(s): ABDOMINAL AORTIC ENDOVASCULAR STENT GRAFT (N/A) as a surgical intervention .  The patient's history has been reviewed, patient examined, no change in status, stable for surgery.  I have reviewed the patient's chart and labs.  Questions were answered to the patient's satisfaction.     BRABHAM IV, V. WELLS

## 2014-07-20 NOTE — Anesthesia Postprocedure Evaluation (Signed)
  Anesthesia Post-op Note  Patient: John Choi  Procedure(s) Performed: Procedure(s): ABDOMINAL AORTIC ENDOVASCULAR STENT GRAFT (N/A)  Patient Location: PACU  Anesthesia Type:General  Level of Consciousness: awake, alert  and oriented  Airway and Oxygen Therapy: Patient Spontanous Breathing  Post-op Pain: none  Post-op Assessment: Post-op Vital signs reviewed  Post-op Vital Signs: Reviewed  Last Vitals:  Filed Vitals:   07/20/14 1310  BP: 105/70  Pulse: 70  Temp:   Resp: 16    Complications: No apparent anesthesia complications

## 2014-07-20 NOTE — Op Note (Signed)
Patient name: John Choi MRN: [id number] DOB: [birth year] Sex: male  07/20/2014 Pre-operative Diagnosis: Abdominal aortic aneurysm Post-operative diagnosis:  Same Surgeon:  Eldridge Abrahams Assistants:  Silva Bandy Procedure:   #1: Endovascular repair of abdominal aortic aneurysm   #2: Bilateral ultrasound-guided common femoral artery access   #3: Catheter in aorta 2   #4: Abdominal aortogram   #5: Distal extension 1 Anesthesia:  Gen. Blood Loss:  See anesthesia record Specimens:  None  Findings:  Complete exclusion Devices used: Main body was primary right goal or 23 x 14 x 12 ipsilateral extension was upright 23 x 10.  Contralateral extension was left 23 x 12  Indications:  The patient was found to have a large abdominal aortic aneurysm which is asymptomatic.  He comes in today for endovascular repair  Procedure:  The patient was identified in the holding area and taken to [facility] [address]  The patient was then placed supine on the table. general anesthesia was administered.  The patient was prepped and draped in the usual sterile fashion.  A time out was called and antibiotics were administered.  Ultrasound was used to evaluate bilateral common femoral arteries which were minimally calcified and widely patent.  A #11 blade was used to make a skin nick.  Under ultrasound guidance, the bilateral common femoral arteries were cannulated with an 18-gauge needle.  An 035 wire was advanced without resistance.  The subcutaneous tract was dilated with an 8 [place] dilator.  Provide devices were deployed at the 11:00 and 1:00 position for pre-closure.  8 French sheaths were placed bilaterally.  The patient was fully heparinized.  On the right, over a Amplatz superstiff wire a 43 French sheath was inserted into the aorta.  A pigtail catheter was advanced up the left.  An abdominal aortogram was performed to locate the renal arteries.  The main body device was prepared on the back table.  This  was a Dealer 23 x 14 x 12.  This was advanced up the right side and successfully deployed landing at the level of the renal arteries.  Next, the contralateral gate was cannulated with a Kumpe catheter and a Bentson wire.  A Omni flush catheter was able to be rotated within the main body, confirming successful cannulation.  The image detector was withdrawn and rotated to a right anterior oblique position.  A retrograde sheath injection was performed, locating the left hypogastric artery.  Over Amplatz Super Stiff wire, a 14 French sheath was inserted into the contralateral gate.  The contralateral extension was prepared and inserted.  This was a Dealer 23 x 12 device.  This was deployed landing just proximal to the left hypogastric artery.  Next, the remaining portion of the ipsilateral device was deployed.  The image detector was rotated to a left anterior oblique position and a retrograde sheath injection was performed, locating the right hypogastric artery.  The ipsilateral extension was then prepared and inserted.  It was deployed landing just proximal to the right hypogastric artery.  This was a 23 x 10 Gore device.  Next, the Q-50 balloon was used to mold the proximal and distal attachment sites as well as device overlap.  A completion angiogram was performed which showed no evidence of a type I or type III endoleak.  There was preservation of blood flow to bilateral renal arteries and bilateral hypogastric arteries.  At this point, over Bentson wires, both sheaths were withdrawn and the arteriotomy  closed by securing the previously placed probe glide devices.  Both groins were hemostatic.  50 mg of protamine were administered.  Once hemostasis was satisfactory, the incisions were closed with 4-0 Vicryl and Dermabond.  The patient had palpable pedal pulses afterwards.  There were no immediate complications.   Disposition:  To PACU in stable condition.   Theotis Burrow, M.D. [facility] [place] Office: [phone] Pager:  [phone]

## 2014-07-20 NOTE — Anesthesia Preprocedure Evaluation (Addendum)
Anesthesia Evaluation  Patient identified by MRN, date of birth, ID band Patient awake    Reviewed: Allergy & Precautions, H&P , NPO status , Patient's Chart, lab work & pertinent test results  Airway Mallampati: III  TM Distance: >3 FB Neck ROM: Full    Dental  (+) Edentulous Upper, Edentulous Lower   Pulmonary asthma , Current Smoker,          Cardiovascular hypertension, + Peripheral Vascular Disease  05/2014 Cardiac cath. Widely patent coronary arteries. Normal LV function.   Neuro/Psych negative neurological ROS  negative psych ROS   GI/Hepatic negative GI ROS, Neg liver ROS,   Endo/Other  negative endocrine ROS  Renal/GU CRFRenal disease     Musculoskeletal negative musculoskeletal ROS (+)   Abdominal   Peds  Hematology negative hematology ROS (+)   Anesthesia Other Findings   Reproductive/Obstetrics                            Anesthesia Physical Anesthesia Plan  ASA: III  Anesthesia Plan: General   Post-op Pain Management:    Induction: Intravenous  Airway Management Planned: Oral ETT  Additional Equipment: Arterial line, CVP and Ultrasound Guidance Line Placement  Intra-op Plan:   Post-operative Plan: Extubation in OR  Informed Consent: I have reviewed the patients History and Physical, chart, labs and discussed the procedure including the risks, benefits and alternatives for the proposed anesthesia with the patient or authorized representative who has indicated his/her understanding and acceptance.   Dental advisory given  Plan Discussed with: CRNA and Surgeon  Anesthesia Plan Comments:         Anesthesia Quick Evaluation

## 2014-07-20 NOTE — H&P (View-Only) (Signed)
Patient name: John Choi MRN: [id number] DOB: Feb 11, 1951 Sex: male   Referred by: Dr. Burt Knack  Reason for referral:  Chief Complaint  Patient presents with  . New Evaluation    AAA referred by Dr Ezzie Dural    HISTORY OF PRESENT ILLNESS: The patient comes in today for evaluation of an abdominal aortic aneurysm.  This has been followed with ultrasound and recently showed maximum diameter of 5.4 cm.  He also has bilateral iliac aneurysms.  When the patient saw Dr. Burt Knack he was complaining of pain in his left upper chest that radiated to his left arm and neck and was exacerbated by activity.he underwent cardiac catheterization which revealed widely patent coronary arteries and normal LV function.  He is on aspirin for antiplatelet therapy.  The patient is medically managed for hypercholesterolemia with a statin.  He is also taking medication for blood pressure control.  The patient is a long-time smoker but has cutback to 2 cigarettes per day.  He suffers from COPD and does use an inhaler.  Patient denies any abdominal pain related to his aneurysm.  He does have a midline abdominal wall hernia which is tender.  He also has some right testicular pain.  He has had an ultrasound in the past which showed a cyst.  Past Medical History  Diagnosis Date  . Hypertension   . Asthma   . Kidney stones   . Head trauma     Past Surgical History  Procedure Laterality Date  . Back surgery    . Breast surgery      History   Social History  . Marital Status: Divorced    Spouse Name: N/A    Number of Children: N/A  . Years of Education: N/A   Occupational History  . Not on file.   Social History Main Topics  . Smoking status: Current Every Day Smoker -- 1.50 packs/day  . Smokeless tobacco: Never Used  . Alcohol Use: Yes     Comment: 2 beers a month  . Drug Use: No  . Sexual Activity: Not on file   Other Topics Concern  . Not on file   Social History Narrative  . No  narrative on file    Family History  Problem Relation Age of Onset  . Heart disease Mother   . Heart attack Father   . Stroke Father   . Heart disease Father   . Cancer Sister   . Heart disease Sister   . Cancer Brother   . Stroke Brother     Allergies as of 07/04/2014  . (No Known Allergies)    Current Outpatient Prescriptions on File Prior to Visit  Medication Sig Dispense Refill  . albuterol (PROVENTIL HFA;VENTOLIN HFA) 108 (90 BASE) MCG/ACT inhaler Inhale 2 puffs into the lungs every 6 (six) hours as needed for wheezing or shortness of breath.      Marland Kitchen amLODipine (NORVASC) 10 MG tablet Take 10 mg by mouth at bedtime.       Marland Kitchen aspirin EC 81 MG tablet Take 81 mg by mouth at bedtime.       . metoprolol succinate (TOPROL-XL) 25 MG 24 hr tablet Take 12.5 mg by mouth at bedtime.      . simvastatin (ZOCOR) 20 MG tablet Take 20 mg by mouth at bedtime.         Current Facility-Administered Medications on File Prior to Visit  Medication Dose Route Frequency Provider Last Rate Last Dose  .  diazepam (VALIUM) tablet 5 mg  5 mg Oral Once [name], MD         REVIEW OF SYSTEMS: Cardiovascular: No chest pain, chest pressure, palpitations, orthopnea. No claudication or rest pain,  No history of DVT or phlebitis. Pulmonary: No productive cough or wheezing.positive for asthma and shortness of breath with exertion Neurologic: No weakness, paresthesias, aphasia, or amaurosis. No dizziness. Hematologic: No bleeding problems or clotting disorders. Musculoskeletal: No joint pain or joint swelling. Gastrointestinal: No blood in stool or hematemesis Genitourinary: No dysuria or hematuria. Psychiatric:: No history of major depression. Integumentary: No rashes or ulcers. Constitutional: No fever or chills.  PHYSICAL EXAMINATION: General: The patient appears their stated age.  Vital signs are BP 131/88  Pulse 87  Resp 14  Ht 6' (1.829 m)  Wt 187 lb (84.823 kg)  BMI 25.36 kg/m2 HEENT:   No gross abnormalities Pulmonary: Respirations are non-labored Abdomen: Soft and non-tender .  Midline abdominal hernia which is tender.  No incarcerated or strangulated bowel.  Aorta is nontender Musculoskeletal: There are no major deformities.   Neurologic: No focal weakness or paresthesias are detected, Skin: There are no ulcer or rashes noted. Psychiatric: The patient has normal affect. Cardiovascular: There is a regular rate and rhythm without significant murmur appreciated.no carotid bruits.  Palpable pedal pulses  Diagnostic Studies: I have reviewed his CT angiogram which showsa 5.7 cm infrarenal abdominal aortic aneurysm with aneurysmal changes of the left common iliac artery at 22 mm and right common iliac artery at 17 mm    Assessment:  Abdominal aortic aneurysm Plan: Based on the patient's anatomy, I feel he is a good candidate for endovascular repair.  I described the details of the procedure, as well as the potential risks, including but not limited torenal failure, intestinal ischemia, lower extremity ischemia, cardiopulmonary complications, and death.  We discussed the risks of rupture if this is not repaired.  The patient is here to have this fixed.  I have scheduled him for percutaneous endovascular aneurysm repair on Friday, October 30.  The patient will get carotid Dopplers to evaluate for carotid stenosis as well as lower extremity duplex to evaluate for popliteal aneurysm, prior to his surgery     V. Leia Alf, M.D. [facility] [place] Office: [phone] Pager:  [phone]

## 2014-07-20 NOTE — Transfer of Care (Signed)
Immediate Anesthesia Transfer of Care Note  Patient: John Choi  Procedure(s) Performed: Procedure(s): ABDOMINAL AORTIC ENDOVASCULAR STENT GRAFT (N/A)  Patient Location: PACU  Anesthesia Type:General  Level of Consciousness: awake and alert   Airway & Oxygen Therapy: Patient Spontanous Breathing and Patient connected to nasal cannula oxygen  Post-op Assessment: Report given to PACU RN and Post -op Vital signs reviewed and stable  Post vital signs: Reviewed and stable  Complications: No apparent anesthesia complications

## 2014-07-20 NOTE — Anesthesia Postprocedure Evaluation (Deleted)
  Anesthesia Post-op Note  Patient: John Choi  Procedure(s) Performed: Procedure(s): ABDOMINAL AORTIC ENDOVASCULAR STENT GRAFT (N/A)  Patient Location: PACU  Anesthesia Type:General  Level of Consciousness: awake, alert  and oriented  Airway and Oxygen Therapy: Patient Spontanous Breathing and Patient connected to face mask oxygen  Post-op Pain: none  Post-op Assessment: Post-op Vital signs reviewed, Patient's Cardiovascular Status Stable, Respiratory Function Stable, Patent Airway, No signs of Nausea or vomiting, Pain level controlled, No headache, No backache, No residual numbness and No residual motor weakness  Post-op Vital Signs: Reviewed and stable  Last Vitals:  Filed Vitals:   07/20/14 0546  BP: 120/68  Pulse: 83  Temp: 36.3 C  Resp: 16    Complications: No apparent anesthesia complications

## 2014-07-20 NOTE — Anesthesia Procedure Notes (Signed)
Procedure Name: Intubation Date/Time: 07/20/2014 7:51 AM Performed by: Gwynneth Aliment Pre-anesthesia Checklist: Patient identified, Emergency Drugs available, Suction available, Patient being monitored and Timeout performed Patient Re-evaluated:Patient Re-evaluated prior to inductionOxygen Delivery Method: Circle system utilized Preoxygenation: Pre-oxygenation with 100% oxygen Intubation Type: IV induction Ventilation: Mask ventilation without difficulty and Oral airway inserted - appropriate to patient size Laryngoscope Size: Miller and 4 Grade View: Grade II Tube type: Oral Tube size: 7.5 mm Number of attempts: 1 Airway Equipment and Method: Stylet Placement Confirmation: ETT inserted through vocal cords under direct vision,  positive ETCO2 and breath sounds checked- equal and bilateral Secured at: 22 cm Tube secured with: Tape Dental Injury: Teeth and Oropharynx as per pre-operative assessment

## 2014-07-20 NOTE — Progress Notes (Signed)
Utilization review completed.  

## 2014-07-21 LAB — CBC
HEMATOCRIT: 38.9 % — AB (ref 39.0–52.0)
Hemoglobin: 13.5 g/dL (ref 13.0–17.0)
MCH: 31.8 pg (ref 26.0–34.0)
MCHC: 34.7 g/dL (ref 30.0–36.0)
MCV: 91.7 fL (ref 78.0–100.0)
Platelets: 200 10*3/uL (ref 150–400)
RBC: 4.24 MIL/uL (ref 4.22–5.81)
RDW: 13.8 % (ref 11.5–15.5)
WBC: 9.9 10*3/uL (ref 4.0–10.5)

## 2014-07-21 LAB — BASIC METABOLIC PANEL
Anion gap: 13 (ref 5–15)
BUN: 16 mg/dL (ref 6–23)
CHLORIDE: 104 meq/L (ref 96–112)
CO2: 23 meq/L (ref 19–32)
CREATININE: 1.31 mg/dL (ref 0.50–1.35)
Calcium: 8.7 mg/dL (ref 8.4–10.5)
GFR calc Af Amer: 65 mL/min — ABNORMAL LOW (ref >90)
GFR calc non Af Amer: 56 mL/min — ABNORMAL LOW (ref >90)
Glucose, Bld: 114 mg/dL — ABNORMAL HIGH (ref 70–99)
Potassium: 4.4 mEq/L (ref 3.7–5.3)
Sodium: 140 mEq/L (ref 137–147)

## 2014-07-21 MED ORDER — ALBUTEROL SULFATE (2.5 MG/3ML) 0.083% IN NEBU
2.5000 mg | INHALATION_SOLUTION | RESPIRATORY_TRACT | Status: DC
  Filled 2014-07-21: qty 3

## 2014-07-21 MED ORDER — BISACODYL 10 MG RE SUPP: 10.0000 mg | Freq: Once | RECTAL | Status: DC

## 2014-07-21 MED ORDER — ALBUTEROL SULFATE HFA 108 (90 BASE) MCG/ACT IN AERS: 2.0000 | INHALATION_SPRAY | RESPIRATORY_TRACT | Status: DC

## 2014-07-21 NOTE — Progress Notes (Signed)
Pt d/c home per MD orders, instructions given . Family at [facility], family and pt verbalized understanding of d/c, pt VSS, all questions answered

## 2014-07-21 NOTE — Progress Notes (Signed)
0020 pt c/o of stabbing chest pain. MD notified. ECG obtained. Will continue to monitor pt

## 2014-07-21 NOTE — Progress Notes (Addendum)
  Progress Note    07/21/2014 7:51 AM 1 Day Post-Op  Subjective:  States chest pain has resolved.  States he needs his albuterol inhaler, which his friend is bringing in this morning.    Tm 99 now afebrile HR  70's-100's regular 371-696'V systolic 89%   Filed Vitals:   07/21/14 0359  BP: 118/73  Pulse: 76  Temp: 97.4 F (36.3 C)  Resp: 13    Physical Exam: General:  No acute distress laying comfortably in bed Cardiac:  regular Lungs:  CTAB-no wheezing or rales Incisions:  Bilateral groins are soft without hematoma Abdomen:  - flatus; -N/V; tender midline at hernia Extremities:  2+ palpable DP/PT bilaterally  CBC    Component Value Date/Time   WBC 9.9 07/21/2014 0620   RBC 4.24 07/21/2014 0620   HGB 13.5 07/21/2014 0620   HCT 38.9* 07/21/2014 0620   PLT 200 07/21/2014 0620   MCV 91.7 07/21/2014 0620   MCH 31.8 07/21/2014 0620   MCHC 34.7 07/21/2014 0620   RDW 13.8 07/21/2014 0620   LYMPHSABS 3.2 05/24/2014 1404   MONOABS 0.5 05/24/2014 1404   EOSABS 0.7 05/24/2014 1404   BASOSABS 0.1 05/24/2014 1404   BMET    Component Value Date/Time   NA 140 07/21/2014 0620   K 4.4 07/21/2014 0620   CL 104 07/21/2014 0620   CO2 23 07/21/2014 0620   GLUCOSE 114* 07/21/2014 0620   BUN 16 07/21/2014 0620   CREATININE 1.31 07/21/2014 0620   CALCIUM 8.7 07/21/2014 0620   GFRNONAA 56* 07/21/2014 0620   GFRAA 65* 07/21/2014 0620     INR    Component Value Date/Time   INR 1.06 07/20/2014 1000     Intake/Output Summary (Last 24 hours) at 07/21/14 0751 Last data filed at 07/21/14 0400  Gross per 24 hour  Intake 3062.5 ml  Output   3250 ml  Net -187.5 ml     Assessment:  63 y.o. male is s/p:  #1: Endovascular repair of abdominal aortic aneurysm  #2: Bilateral ultrasound-guided common femoral artery access  #3: Catheter in aorta 2  #4: Abdominal aortogram  #5: Distal extension 1  1 Day Post-Op  Plan: -pt's chest pain from this am has resolved with GI cocktail.   EKG was negative.  He states that he needs his albuterol inhaler (friend bringing in this am).   He does have a hx of COPD/asthma.  He is a long time smoker, but has cut back to 2 cigarettes/day. -denies flatus-will give dulcolax this am  -tenderness at hernia site, which is not new -needs to ambulate -foley removed this am-needs to void -DVT prophylaxis:  Lovenox to start today -creatinine 1.31 this am, which is down from 1.40 yesterday-pt has good UOP.   Leontine Locket, PA-C [facility] [phone] 07/21/2014 7:51 AM  Patient has had albuterol treatment and feels better from pulmonary standpoint O2 sats 87 on room air but patient asymptomatic Patient beginning to ambulate Refuses Dulcolax suppository Has voided once since catheter removed  Plan DC home today return in 4 weeks follow-up with Dr. Trula Slade

## 2014-07-21 NOTE — Discharge Summary (Signed)
[facility] Discharge Summary   John Choi 1950/09/24 63 y.o. male  [id number]  Admission Date: 07/20/2014  Discharge Date: 07/21/14  Physician: Serafina Mitchell, MD  Admission Diagnosis: Aneurysm, abdominal aortic    HPI:   This is a 63 y.o. male patient comes in today for evaluation of an abdominal aortic aneurysm. This has been followed with ultrasound and recently showed maximum diameter of 5.4 cm. He also has bilateral iliac aneurysms. When the patient saw Dr. Burt Knack he was complaining of pain in his left upper chest that radiated to his left arm and neck and was exacerbated by activity.he underwent cardiac catheterization which revealed widely patent coronary arteries and normal LV function. He is on aspirin for antiplatelet therapy.  The patient is medically managed for hypercholesterolemia with a statin. He is also taking medication for blood pressure control. The patient is a long-time smoker but has cutback to 2 cigarettes per day. He suffers from COPD and does use an inhaler.  Patient denies any abdominal pain related to his aneurysm. He does have a midline abdominal wall hernia which is tender. He also has some right testicular pain. He has had an ultrasound in the past which showed a cyst.  Hospital Course:  The patient was admitted to the hospital and taken to the operating room on 07/20/2014 and underwent: #1: Endovascular repair of abdominal aortic aneurysm  #2: Bilateral ultrasound-guided common femoral artery access  #3: Catheter in aorta 2  #4: Abdominal aortogram  #5: Distal extension 1    The pt tolerated the procedure well and was transported to the PACU in good condition.   The evening of surgery, he did have some chest pain.  An EKG was obtained and was normal.   His symptoms resolved with a GI cocktail.  By POD 1, he was not passing flatus and was ordered a suppository, but the pt did refuse this.  He also stated that he would feel better with his  albuterol inhaler and this was ordered.  He did receive this and feels better from pulmonary standpoint.  His O2 sats are 87% on room air, but pt is asymptomatic.  He did ambulate one lap and was 88% on RA, but recovered to 90% on RA.  He was verbally abusive to nursing tech after walk and adamant about discharging home.  Given pt is asymptomatic on RA and has voided since catheter removal, Dr. Kellie Simmering okay with pt's discharge.  His creatinine improved on POD 1.  He did have some tenderness in his abdomen over his hernia site, which is not new.   The remainder of the hospital course consisted of increasing mobilization and increasing intake of solids without difficulty.  CBC    Component Value Date/Time   WBC 9.9 07/21/2014 0620   RBC 4.24 07/21/2014 0620   HGB 13.5 07/21/2014 0620   HCT 38.9* 07/21/2014 0620   PLT 200 07/21/2014 0620   MCV 91.7 07/21/2014 0620   MCH 31.8 07/21/2014 0620   MCHC 34.7 07/21/2014 0620   RDW 13.8 07/21/2014 0620   LYMPHSABS 3.2 05/24/2014 1404   MONOABS 0.5 05/24/2014 1404   EOSABS 0.7 05/24/2014 1404   BASOSABS 0.1 05/24/2014 1404    BMET    Component Value Date/Time   NA 140 07/21/2014 0620   K 4.4 07/21/2014 0620   CL 104 07/21/2014 0620   CO2 23 07/21/2014 0620   GLUCOSE 114* 07/21/2014 0620   BUN 16 07/21/2014 0620   CREATININE 1.31 07/21/2014  1829   CALCIUM 8.7 07/21/2014 0620   GFRNONAA 56* 07/21/2014 0620   GFRAA 65* 07/21/2014 0620     Discharge Instructions:   The patient is discharged with extensive instructions on wound care and progressive ambulation.  They are instructed not to drive or perform any heavy lifting until returning to see the physician in his office.  Discharge Instructions   ABDOMINAL PROCEDURE/ANEURYSM REPAIR/AORTO-BIFEMORAL BYPASS:  Call MD for increased abdominal pain; cramping diarrhea; nausea/vomiting    Complete by:  As directed      Call MD for:  redness, tenderness, or signs of infection (pain, swelling, bleeding,  redness, odor or green/yellow discharge around incision site)    Complete by:  As directed      Call MD for:  severe or increased pain, loss or decreased feeling  in affected limb(s)    Complete by:  As directed      Call MD for:  temperature >100.5    Complete by:  As directed      Driving Restrictions    Complete by:  As directed   No driving for 2 weeks     Increase activity slowly    Complete by:  As directed   Walk with assistance use walker or cane as needed     Lifting restrictions    Complete by:  As directed   No lifting for 4 weeks     Resume previous diet    Complete by:  As directed      may wash over wound with mild soap and water    Complete by:  As directed            Discharge Diagnosis:  Aneurysm, abdominal aortic   Secondary Diagnosis: Patient Active Problem List   Diagnosis Date Noted  . Abdominal aortic aneurysm without rupture 07/20/2014  . Hyperlipidemia 05/24/2014  . Abdominal aortic aneurysm 10/04/2011  . Chest pain 10/04/2011   Past Medical History  Diagnosis Date  . Hypertension   . Asthma   . Kidney stones   . Head trauma        Medication List         albuterol 108 (90 BASE) MCG/ACT inhaler  Commonly known as:  PROVENTIL HFA;VENTOLIN HFA  Inhale 2 puffs into the lungs every 6 (six) hours as needed for wheezing or shortness of breath.     amLODipine 10 MG tablet  Commonly known as:  NORVASC  Take 10 mg by mouth at bedtime.     aspirin EC 81 MG tablet  Take 81 mg by mouth at bedtime.     metoprolol succinate 25 MG 24 hr tablet  Commonly known as:  TOPROL-XL  Take 12.5 mg by mouth at bedtime.     oxyCODONE 5 MG immediate release tablet  Commonly known as:  ROXICODONE  Take 1 tablet (5 mg total) by mouth every 6 (six) hours as needed for severe pain.     simvastatin 20 MG tablet  Commonly known as:  ZOCOR  Take 20 mg by mouth at bedtime.        Roxicodone #15 No Refill  Disposition: home  Patient's condition: is  Good  Follow up: 1. Dr. Trula Slade in 4 weeks with CTA   Leontine Locket, PA-C [facility] [phone] 07/21/2014  8:55 AM   - For VQI Registry use --- Instructions: Press F2 to tab through selections.  Delete question if not applicable.   Post-op:  Time to Extubation: [x ]  In OR, [ ]  < 12 hrs, [ ]  12-24 hrs, [ ]  >=24 hrs Vasopressors Req. Post-op: No MI: No., [ ]  Troponin only, [ ]  EKG or Clinical New Arrhythmia: No CHF: No ICU Stay: 1 day in stepdown Transfusion: No  If yes, n/a units given  Complications: Resp failure: No., [ ]  Pneumonia, [ ]  Ventilator Chg in renal function: No., [ ]  Inc. Cr > 0.5, [ ]  Temp. Dialysis, [ ]  Permanent dialysis Leg ischemia: No., no Surgery needed, [ ]  Yes, Surgery needed, [ ]  Amputation Bowel ischemia: No., [ ]  Medical Rx, [ ]  Surgical Rx Wound complication: No., [ ]  Superficial separation/infection, [ ]  Return to OR Return to OR: No  Return to OR for bleeding: No Stroke: No., [ ]  Minor, [ ]  Major  Discharge medications: Statin use:  Yes If No: [ ]  For Medical reasons, [ ]  Non-compliant, [ ]  Not-indicated ASA use:  Yes  If No: [ ]  For Medical reasons, [ ]  Non-compliant, [ ]  Not-indicated Plavix use:  No If No: [ ]  For Medical reasons, [ ]  Non-compliant, [ ]  Not-indicated Beta blocker use:  Yes If No: [ ]  For Medical reasons, [ ]  Non-compliant, [ ]  Not-indicated

## 2014-07-23 ENCOUNTER — Encounter (HOSPITAL_COMMUNITY): Payer: Self-pay | Admitting: Surgery

## 2014-07-26 ENCOUNTER — Telehealth: Payer: Self-pay | Admitting: Surgery

## 2014-07-26 NOTE — Telephone Encounter (Signed)
-----   Message from Mena Goes, RN sent at 07/20/2014  1:06 PM EDT ----- Regarding: Schedule   ----- Message -----    From: Alvia Grove, PA-C    Sent: 07/20/2014  12:33 PM      To: [facility] Charge Pool  S/p [facility] 07/20/14  F/u with Dr. Trula Slade in 4 weeks with CTA.  Thanks Maudie Mercury

## 2014-07-26 NOTE — Telephone Encounter (Signed)
Left msg for patient to call regarding follow up appt, mailed CT letter, dpm

## 2014-08-20 ENCOUNTER — Encounter: Payer: Medicare Other | Admitting: Surgery

## 2014-08-27 ENCOUNTER — Ambulatory Visit
Admission: RE | Admit: 2014-08-27 | Discharge: 2014-08-27 | Disposition: A | Discharge status: Home / Self Care | Payer: Medicare Other | Source: Ambulatory Visit | Attending: Surgery | Admitting: Surgery

## 2014-08-27 ENCOUNTER — Ambulatory Visit (INDEPENDENT_AMBULATORY_CARE_PROVIDER_SITE_OTHER): Payer: Medicare Other | Admitting: Surgery

## 2014-08-27 VITALS — BP 132/84 | HR 84 | Temp 97.4°F | Resp 18 | Ht 72.0 in | Wt 180.0 lb

## 2014-08-27 DIAGNOSIS — Z48812 Encounter for surgical aftercare following surgery on the circulatory system: Secondary | ICD-10-CM

## 2014-08-27 DIAGNOSIS — I714 Abdominal aortic aneurysm, without rupture, unspecified: Secondary | ICD-10-CM

## 2014-08-27 MED ORDER — IOHEXOL 350 MG/ML SOLN
75.0000 mL | Freq: Once | INTRAVENOUS | Status: AC | PRN
  Administered 2014-08-27: 75 mL via INTRAVENOUS

## 2014-08-27 NOTE — Addendum Note (Signed)
Addended by: Mena Goes on: 08/27/2014 04:13 PM   Modules accepted: Orders

## 2014-08-27 NOTE — Progress Notes (Signed)
The patient is here for his first postoperative follow-up.  On 07/20/2014 he underwent endovascular repair of an abdominal aortic aneurysm.  His postoperative course was uncomplicated and he was discharged to home on postoperative day 1.  He has no complaints today.  On examination both groin incisions have completely healed.  His abdomen is soft.  CT angiogram today shows a decrease in maximum diameter of the aneurysm from 5.7 down to 5.4 today.  There is no evidence of endoleak.  The patient continues to do very well.  I will see him back in 6 months for an abdominal ultrasound.  Dr. 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  John Choi

## 2014-08-30 ENCOUNTER — Encounter (HOSPITAL_COMMUNITY): Payer: Self-pay | Admitting: Cardiovascular Disease

## 2014-10-02 DIAGNOSIS — E663 Overweight: Secondary | ICD-10-CM | POA: Diagnosis not present

## 2014-10-02 DIAGNOSIS — J069 Acute upper respiratory infection, unspecified: Secondary | ICD-10-CM | POA: Diagnosis not present

## 2014-10-02 DIAGNOSIS — Z6826 Body mass index (BMI) 26.0-26.9, adult: Secondary | ICD-10-CM | POA: Diagnosis not present

## 2014-11-23 ENCOUNTER — Encounter: Payer: Self-pay | Admitting: Surgery

## 2014-12-03 IMAGING — CT CT CTA ABD/PEL W/CM AND/OR W/O CM
2 of 6 series · 14 of 46 positions shown, 18 images · IV contrast (Omnipaque 300)
Comparison: CT scan of August 20, 2011. CT scan of chest October 06, 2011.

CLINICAL DATA: Abdominal aortic aneurysm.

EXAM:
CT ANGIOGRAPHY CHEST, ABDOMEN AND PELVIS
TECHNIQUE: Multidetector CT imaging through the chest, abdomen and pelvis was
performed using the standard protocol during bolus administration of
intravenous contrast. Multiplanar reconstructed images and MIPs were
obtained and reviewed to evaluate the vascular anatomy.
CONTRAST:  100mL OMNIPAQUE IOHEXOL 350 MG/ML SOLN

[Series 4: cta_cap 3.0 · axial · 0.76mm/px · z∈[-582,-64]mm · 11 of 203 slices shown, 15 images]
[im 20/203  soft-tissue]
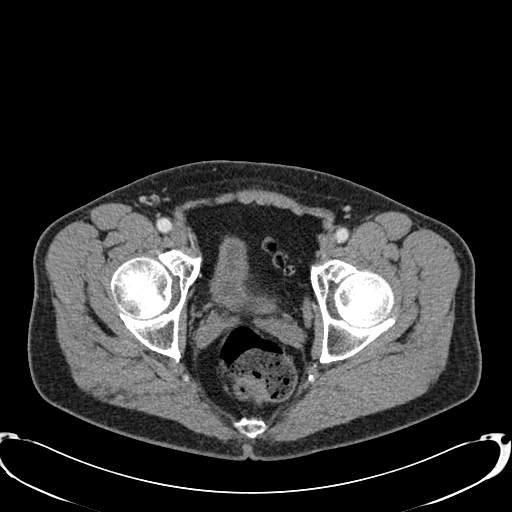
[im 20/203  bone]
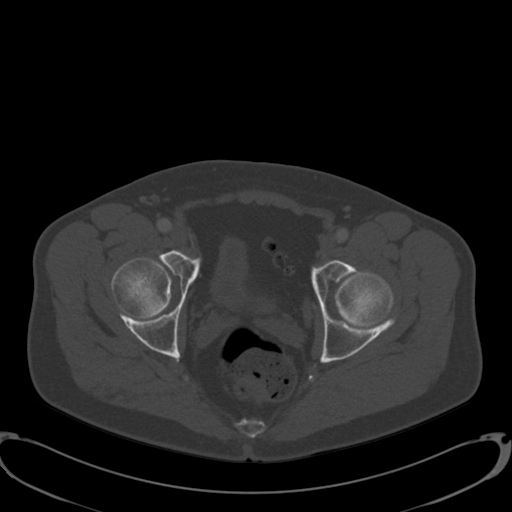
[im 39/203  soft-tissue]
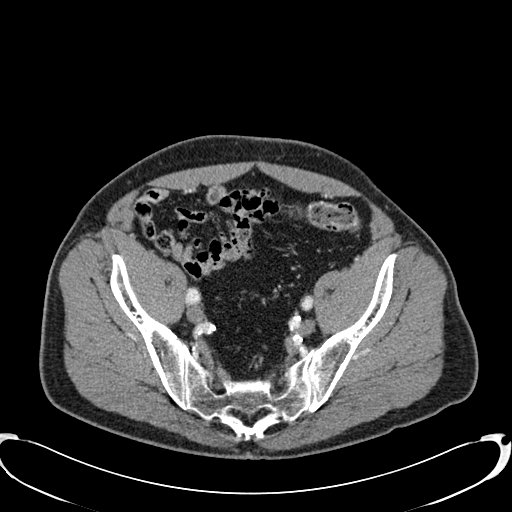
[im 58/203  soft-tissue]
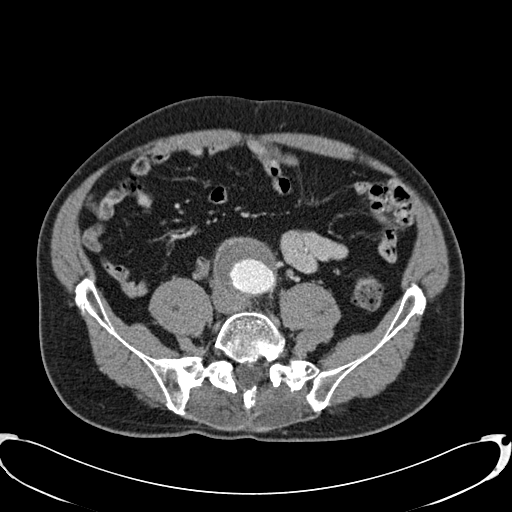
[im 77/203  soft-tissue]
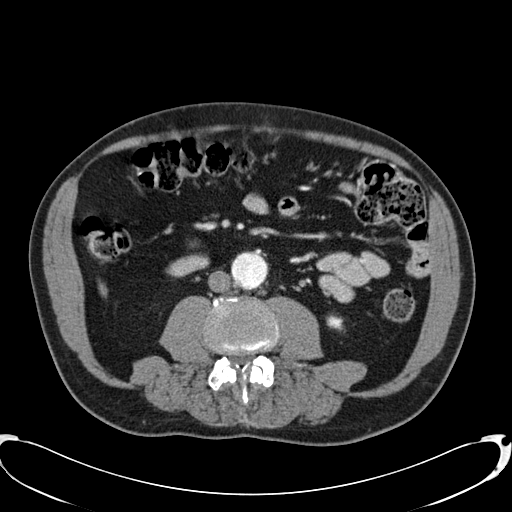
[im 106/203  soft-tissue]
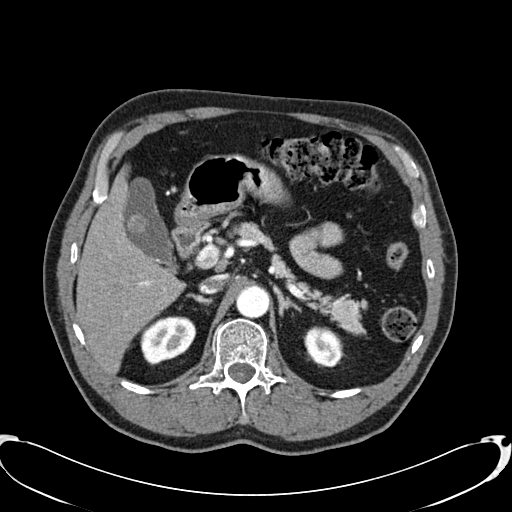
[im 126/203  soft-tissue]
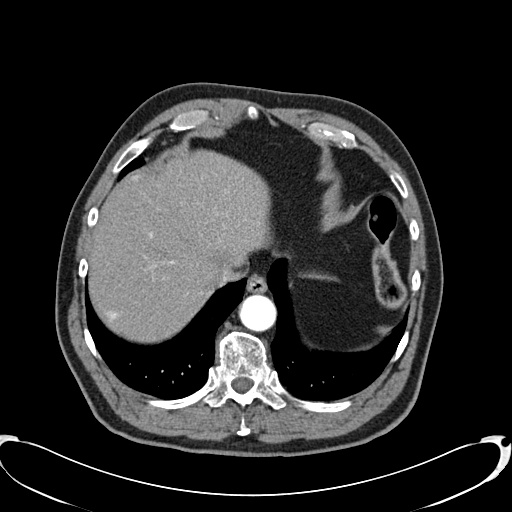
[im 145/203  soft-tissue]
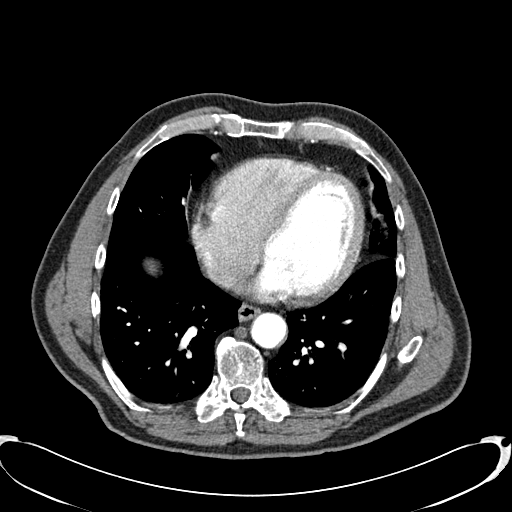
[im 164/203  soft-tissue]
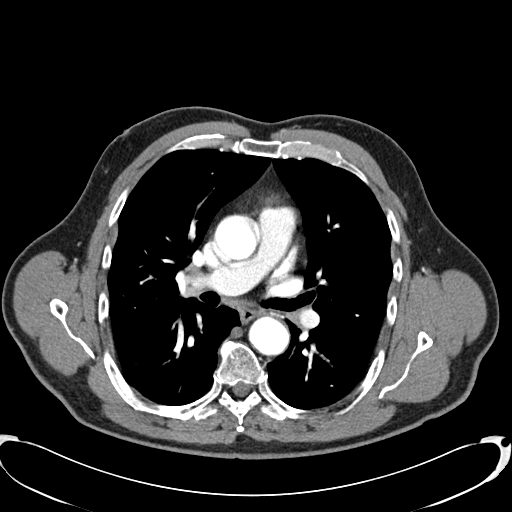
[im 164/203  lung]
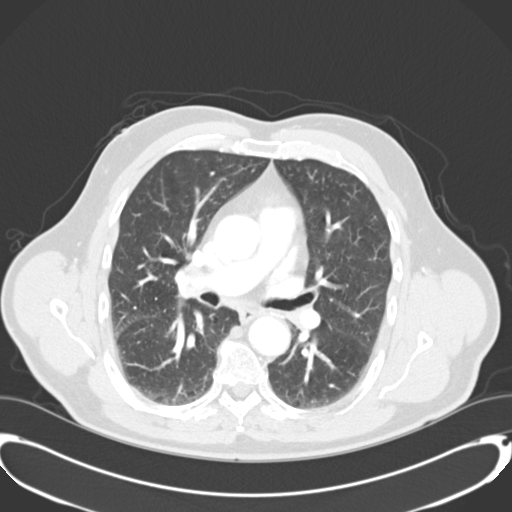
[im 174/203  lung]
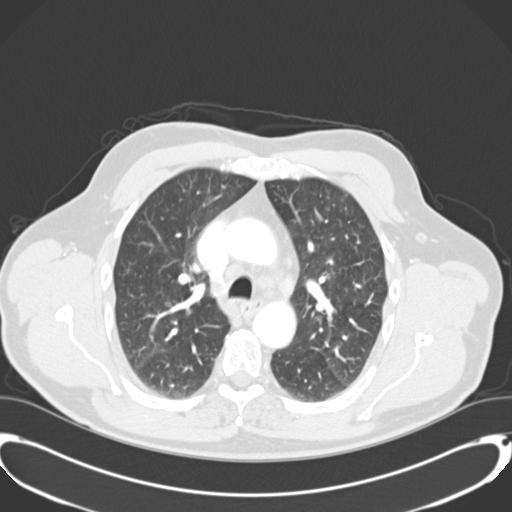
[im 183/203  soft-tissue]
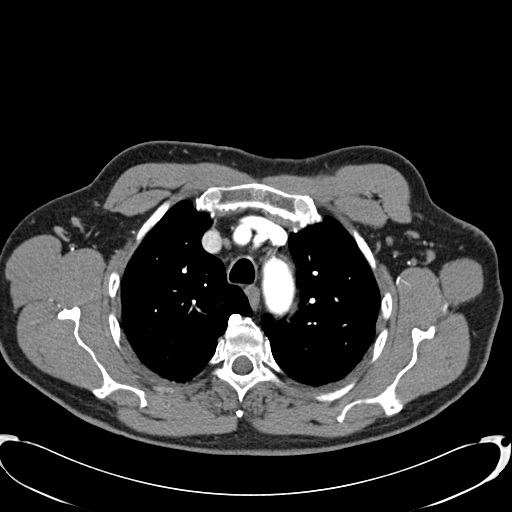
[im 183/203  lung]
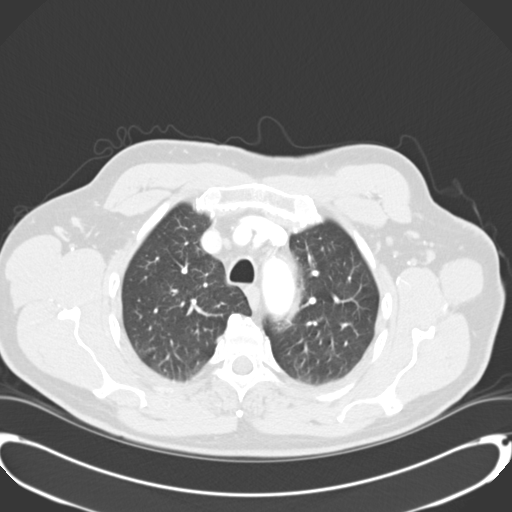
[im 183/203  bone]
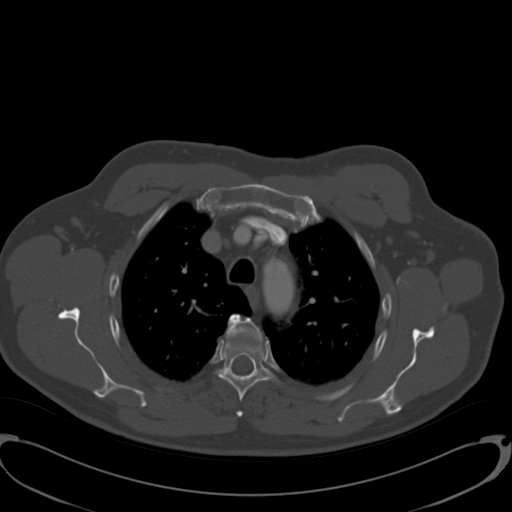
[im 193/203  lung]
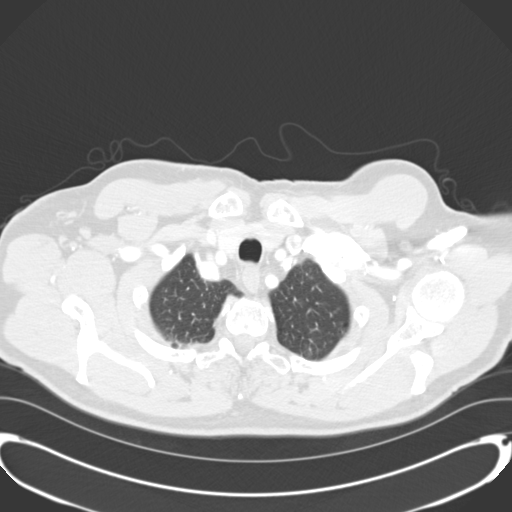

[Series 602: cor mpr · coronal · 1.20mm/px · 3 of 126 slices shown]
[im 32/126  soft-tissue]
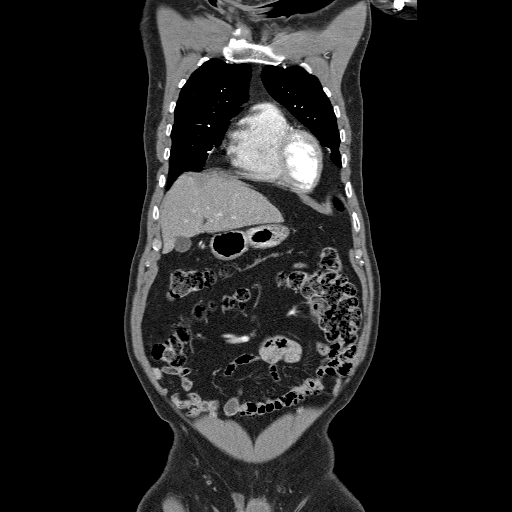
[im 63/126  soft-tissue]
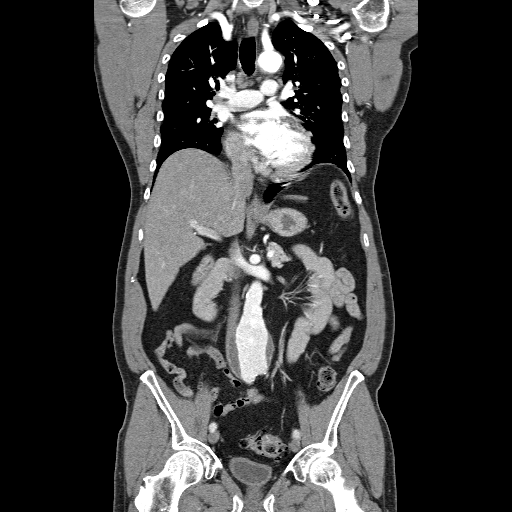
[im 94/126  soft-tissue]
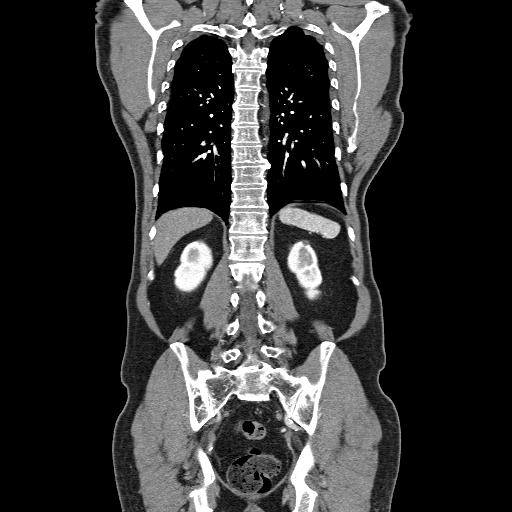

[14 of 46 positions shown; findings below may reference images not displayed]

FINDINGS: CTA CHEST FINDINGS

No pneumothorax or pleural effusion is noted. No acute pulmonary
disease is noted. There is no evidence of thoracic aortic dissection
or aneurysm. The great vessels are widely patent without significant
stenosis. Stable mediastinal adenopathy is noted compared to prior
exam of 0024 ; this most likely is inflammatory or reactive in
origin.

Review of the MIP images confirms the above findings.

CTA ABDOMEN AND PELVIS FINDINGS

Cholelithiasis is noted. No significant abnormality is noted in the
liver, spleen or pancreas. Adrenal glands appear normal.
Nonobstructive left nephrolithiasis is noted. No hydronephrosis or
renal obstruction is noted. No renal or ureteral calculi are noted.
The renal arteries are widely patent without significant stenosis.
The celiac and superior mesenteric artery have a common origin
without significant stenosis. The inferior mesenteric artery is
widely patent. Large distal infrarenal abdominal aortic aneurysm is
noted which measures 5.7 cm in maximum measured AP diameter and
cm in maximum transverse diameter. Mural thrombus is noted. This is
significantly increased in size compared to prior exam. Small fat
containing periumbilical hernia is noted. There is no evidence of
bowel obstruction. The appendix appears normal. No abnormal fluid
collection is noted. Urinary bladder appears normal. Postoperative
changes are noted in the bony pelvis. Aneurysmal dilatation of the
left common iliac artery is noted which measures 22 mm in diameter.
Right common iliac artery is also mildly dilated at 17 mm. External
iliac arteries appear normal. There is no evidence of significant
stenosis or narrowing involving either iliac artery.

Review of the MIP images confirms the above findings.
IMPRESSION: No significant abnormality is seen within the chest.

Large distal infrarenal abdominal aortic aneurysm is noted which
measures 5.7 x 5.5 cm in diameter. This is significantly enlarged
compared to prior exam of 3353.

Nonobstructive nephrolithiasis.

Mild cholelithiasis.

## 2014-12-27 IMAGING — CR DG CHEST 1V PORT
1 series · 1 of 1 positions shown · non-contrast
Comparison: 05/24/2014

CLINICAL DATA: Post AAA repair.  Central line placement

EXAM:
PORTABLE CHEST - 1 VIEW

[AP]
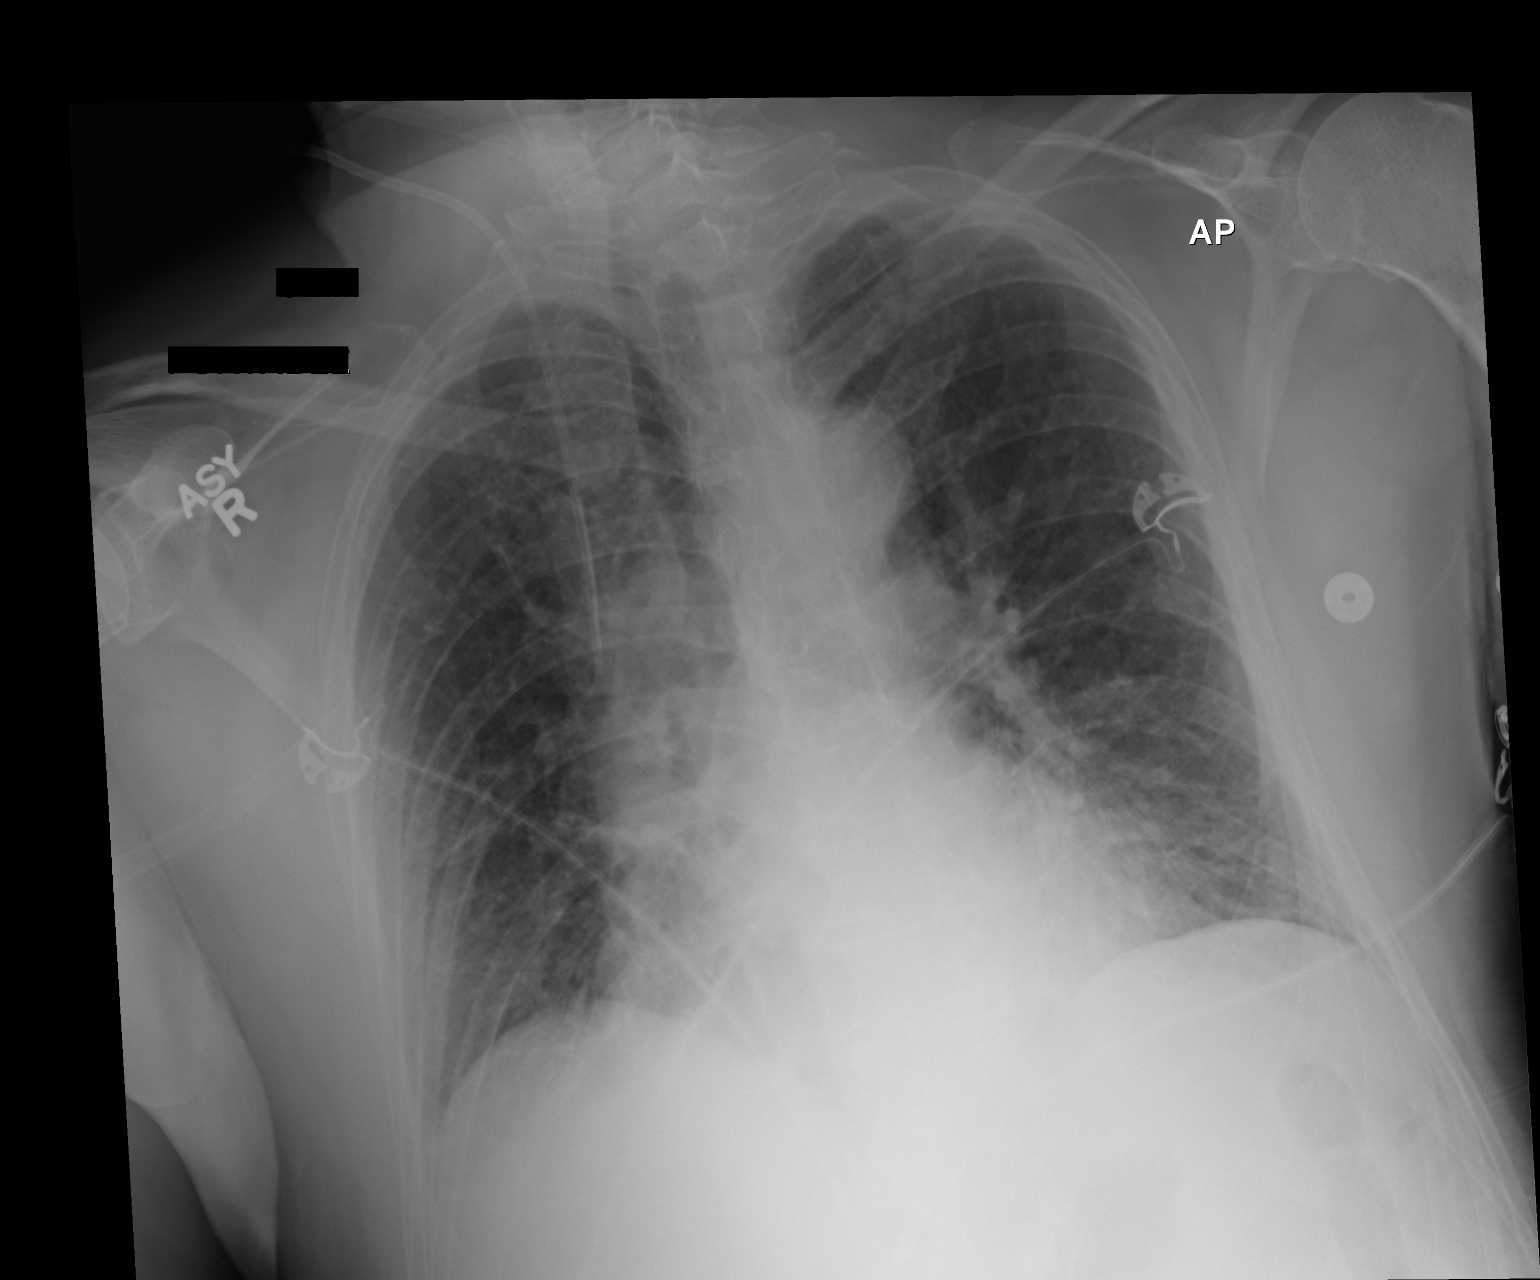

[1 of 1 positions shown; findings below may reference images not displayed]

FINDINGS: Right central line tip is in the SVC. No pneumothorax. Increasing
interstitial prominence throughout the lungs. Heart size is
borderline. No confluent opacities or effusions. No acute bony
abnormality.
IMPRESSION: Right central line tip in the SVC.  No pneumothorax.

Borderline heart size with increasing interstitial prominence, this
could represent early interstitial edema.

## 2015-02-22 ENCOUNTER — Encounter: Payer: Self-pay | Admitting: Family

## 2015-02-25 ENCOUNTER — Ambulatory Visit (HOSPITAL_COMMUNITY)
Admission: RE | Admit: 2015-02-25 | Discharge: 2015-02-25 | Disposition: A | Discharge status: Home / Self Care | Payer: Medicare Other | Source: Ambulatory Visit | Attending: Family | Admitting: Family

## 2015-02-25 ENCOUNTER — Ambulatory Visit (INDEPENDENT_AMBULATORY_CARE_PROVIDER_SITE_OTHER): Payer: Medicare Other | Admitting: Family

## 2015-02-25 ENCOUNTER — Encounter: Payer: Self-pay | Admitting: Family

## 2015-02-25 VITALS — BP 130/86 | HR 80 | Resp 16 | Ht 72.0 in | Wt 188.0 lb

## 2015-02-25 DIAGNOSIS — K439 Ventral hernia without obstruction or gangrene: Secondary | ICD-10-CM

## 2015-02-25 DIAGNOSIS — F17200 Nicotine dependence, unspecified, uncomplicated: Secondary | ICD-10-CM

## 2015-02-25 DIAGNOSIS — Z48812 Encounter for surgical aftercare following surgery on the circulatory system: Secondary | ICD-10-CM | POA: Insufficient documentation

## 2015-02-25 DIAGNOSIS — I714 Abdominal aortic aneurysm, without rupture, unspecified: Secondary | ICD-10-CM

## 2015-02-25 DIAGNOSIS — Z95828 Presence of other vascular implants and grafts: Secondary | ICD-10-CM

## 2015-02-25 DIAGNOSIS — Z720 Tobacco use: Secondary | ICD-10-CM | POA: Diagnosis not present

## 2015-02-25 NOTE — Patient Instructions (Addendum)
Smoking Cessation Quitting smoking is important to your health and has many advantages. However, it is not always easy to quit since nicotine is a very addictive drug. Oftentimes, people try 3 times or more before being able to quit. This document explains the best ways for you to prepare to quit smoking. Quitting takes hard work and a lot of effort, but you can do it. ADVANTAGES OF QUITTING SMOKING  You will live longer, feel better, and live better.  Your body will feel the impact of quitting smoking almost immediately.  Within 20 minutes, blood pressure decreases. Your pulse returns to its normal level.  After 8 hours, carbon monoxide levels in the blood return to normal. Your oxygen level increases.  After 24 hours, the chance of having a heart attack starts to decrease. Your breath, hair, and body stop smelling like smoke.  After 48 hours, damaged nerve endings begin to recover. Your sense of taste and smell improve.  After 72 hours, the body is virtually free of nicotine. Your bronchial tubes relax and breathing becomes easier.  After 2 to 12 weeks, lungs can hold more air. Exercise becomes easier and circulation improves.  The risk of having a heart attack, stroke, cancer, or lung disease is greatly reduced.  After 1 year, the risk of coronary heart disease is cut in half.  After 5 years, the risk of stroke falls to the same as a nonsmoker.  After 10 years, the risk of lung cancer is cut in half and the risk of other cancers decreases significantly.  After 15 years, the risk of coronary heart disease drops, usually to the level of a nonsmoker.  If you are pregnant, quitting smoking will improve your chances of having a healthy baby.  The people you live with, especially any children, will be healthier.  You will have extra money to spend on things other than cigarettes. QUESTIONS TO THINK ABOUT BEFORE ATTEMPTING TO QUIT You may want to talk about your answers with your  health care provider.  Why do you want to quit?  If you tried to quit in the past, what helped and what did not?  What will be the most difficult situations for you after you quit? How will you plan to handle them?  Who can help you through the tough times? Your family? Friends? A health care provider?  What pleasures do you get from smoking? What ways can you still get pleasure if you quit? Here are some questions to ask your health care provider:  How can you help me to be successful at quitting?  What medicine do you think would be best for me and how should I take it?  What should I do if I need more help?  What is smoking withdrawal like? How can I get information on withdrawal? GET READY  Set a quit date.  Change your environment by getting rid of all cigarettes, ashtrays, matches, and lighters in your home, car, or work. Do not let people smoke in your home.  Review your past attempts to quit. Think about what worked and what did not. GET SUPPORT AND ENCOURAGEMENT You have a better chance of being successful if you have help. You can get support in many ways.  Tell your family, friends, and coworkers that you are going to quit and need their support. Ask them not to smoke around you.  Get individual, group, or telephone counseling and support. Programs are available at local hospitals and health centers. Call   your local health department for information about programs in your area.  Spiritual beliefs and practices may help some smokers quit.  Download a "quit meter" on your computer to keep track of quit statistics, such as how long you have gone without smoking, cigarettes not smoked, and money saved.  Get a self-help book about quitting smoking and staying off tobacco. Soham yourself from urges to smoke. Talk to someone, go for a walk, or occupy your time with a task.  Change your normal routine. Take a different route to work.  Drink tea instead of coffee. Eat breakfast in a different place.  Reduce your stress. Take a hot bath, exercise, or read a book.  Plan something enjoyable to do every day. Reward yourself for not smoking.  Explore interactive web-based programs that specialize in helping you quit. GET MEDICINE AND USE IT CORRECTLY Medicines can help you stop smoking and decrease the urge to smoke. Combining medicine with the above behavioral methods and support can greatly increase your chances of successfully quitting smoking.  Nicotine replacement therapy helps deliver nicotine to your body without the negative effects and risks of smoking. Nicotine replacement therapy includes nicotine gum, lozenges, inhalers, nasal sprays, and skin patches. Some may be available over-the-counter and others require a prescription.  Antidepressant medicine helps people abstain from smoking, but how this works is unknown. This medicine is available by prescription.  Nicotinic receptor partial agonist medicine simulates the effect of nicotine in your brain. This medicine is available by prescription. Ask your health care provider for advice about which medicines to use and how to use them based on your health history. Your health care provider will tell you what side effects to look out for if you choose to be on a medicine or therapy. Carefully read the information on the package. Do not use any other product containing nicotine while using a nicotine replacement product.  RELAPSE OR DIFFICULT SITUATIONS Most relapses occur within the first 3 months after quitting. Do not be discouraged if you start smoking again. Remember, most people try several times before finally quitting. You may have symptoms of withdrawal because your body is used to nicotine. You may crave cigarettes, be irritable, feel very hungry, cough often, get headaches, or have difficulty concentrating. The withdrawal symptoms are only temporary. They are strongest  when you first quit, but they will go away within 10-14 days. To reduce the chances of relapse, try to:  Avoid drinking alcohol. Drinking lowers your chances of successfully quitting.  Reduce the amount of caffeine you consume. Once you quit smoking, the amount of caffeine in your body increases and can give you symptoms, such as a rapid heartbeat, sweating, and anxiety.  Avoid smokers because they can make you want to smoke.  Do not let weight gain distract you. Many smokers will gain weight when they quit, usually less than 10 pounds. Eat a healthy diet and stay active. You can always lose the weight gained after you quit.  Find ways to improve your mood other than smoking. FOR MORE INFORMATION  www.smokefree.gov  Document Released: 09/01/2001 Document Revised: 01/22/2014 Document Reviewed: 12/17/2011 [facility] Patient Information 2015 [place], [place]. This information is not intended to replace advice given to you by your health care provider. Make sure you discuss any questions you have with your health care provider.    Smoking Cessation, Tips for Success If you are ready to quit smoking, congratulations! You have chosen to help yourself  be healthier. Cigarettes bring nicotine, tar, carbon monoxide, and other irritants into your body. Your lungs, heart, and blood vessels will be able to work better without these poisons. There are many different ways to quit smoking. Nicotine gum, nicotine patches, a nicotine inhaler, or nicotine nasal spray can help with physical craving. Hypnosis, support groups, and medicines help break the habit of smoking. WHAT THINGS CAN I DO TO MAKE QUITTING EASIER?  Here are some tips to help you quit for good:  Pick a date when you will quit smoking completely. Tell all of your friends and family about your plan to quit on that date.  Do not try to slowly cut down on the number of cigarettes you are smoking. Pick a quit date and quit smoking completely starting  on that day.  Throw away all cigarettes.   Clean and remove all ashtrays from your home, work, and car.  On a card, write down your reasons for quitting. Carry the card with you and read it when you get the urge to smoke.  Cleanse your body of nicotine. Drink enough water and fluids to keep your urine clear or pale yellow. Do this after quitting to flush the nicotine from your body.  Learn to predict your moods. Do not let a bad situation be your excuse to have a cigarette. Some situations in your life might tempt you into wanting a cigarette.  Never have "just one" cigarette. It leads to wanting another and another. Remind yourself of your decision to quit.  Change habits associated with smoking. If you smoked while driving or when feeling stressed, try other activities to replace smoking. Stand up when drinking your coffee. Brush your teeth after eating. Sit in a different chair when you read the paper. Avoid alcohol while trying to quit, and try to drink fewer caffeinated beverages. Alcohol and caffeine may urge you to smoke.  Avoid foods and drinks that can trigger a desire to smoke, such as sugary or spicy foods and alcohol.  Ask people who smoke not to smoke around you.  Have something planned to do right after eating or having a cup of coffee. For example, plan to take a walk or exercise.  Try a relaxation exercise to calm you down and decrease your stress. Remember, you may be tense and nervous for the first 2 weeks after you quit, but this will pass.  Find new activities to keep your hands busy. Play with a pen, coin, or rubber band. Doodle or draw things on paper.  Brush your teeth right after eating. This will help cut down on the craving for the taste of tobacco after meals. You can also try mouthwash.   Use oral substitutes in place of cigarettes. Try using lemon drops, carrots, cinnamon sticks, or chewing gum. Keep them handy so they are available when you have the urge to  smoke.  When you have the urge to smoke, try deep breathing.  Designate your home as a nonsmoking area.  If you are a heavy smoker, ask your health care provider about a prescription for nicotine chewing gum. It can ease your withdrawal from nicotine.  Reward yourself. Set aside the cigarette money you save and buy yourself something nice.  Look for support from others. Join a support group or smoking cessation program. Ask someone at home or at work to help you with your plan to quit smoking.  Always ask yourself, "Do I need this cigarette or is this just a reflex?"  Tell yourself, "Today, I choose not to smoke," or "I do not want to smoke." You are reminding yourself of your decision to quit.  Do not replace cigarette smoking with electronic cigarettes (commonly called e-cigarettes). The safety of e-cigarettes is unknown, and some may contain harmful chemicals.  If you relapse, do not give up! Plan ahead and think about what you will do the next time you get the urge to smoke. HOW WILL I FEEL WHEN I QUIT SMOKING? You may have symptoms of withdrawal because your body is used to nicotine (the addictive substance in cigarettes). You may crave cigarettes, be irritable, feel very hungry, cough often, get headaches, or have difficulty concentrating. The withdrawal symptoms are only temporary. They are strongest when you first quit but will go away within 10-14 days. When withdrawal symptoms occur, stay in control. Think about your reasons for quitting. Remind yourself that these are signs that your body is healing and getting used to being without cigarettes. Remember that withdrawal symptoms are easier to treat than the major diseases that smoking can cause.  Even after the withdrawal is over, expect periodic urges to smoke. However, these cravings are generally short lived and will go away whether you smoke or not. Do not smoke! WHAT RESOURCES ARE AVAILABLE TO HELP ME QUIT SMOKING? Your health care  provider can direct you to community resources or hospitals for support, which may include:  Group support.  Education.  Hypnosis.  Therapy. Document Released: 06/05/2004 Document Revised: 01/22/2014 Document Reviewed: 02/23/2013 [facility] Patient Information 2015 [place], [place]. This information is not intended to replace advice given to you by your health care provider. Make sure you discuss any questions you have with your health care provider.    Call 1-800-QUITNOW

## 2015-02-25 NOTE — Progress Notes (Signed)
[facility] [place]  Established [facility]  History of Present Illness  John Choi is a 64 y.o. (Jun 22, 1951) male patient of Dr. Trula Slade who is s/p endovascular repair of an abdominal aortic aneurysm on 07/20/2014. His postoperative course was uncomplicated and he was discharged to home on postoperative day 1. He returns today for routine follow up.  08/27/14 CT angiogram showed a decrease in maximum diameter of the aneurysm from 5.7 down to 5.4. There was no evidence of endoleak.  Dr. 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  Pt has a ventral hernia that bothers him if he does much lifting, denies back pain. Pt states he has gained 8-10 pounds in the last 8 months. He attributes this to "laziness".   Pt denies claudication symptoms with walking, denies non healing wounds. He denies any history of stroke or TIA.   He takes a daily 81 mg ASA, statin, and beta blocker.   Pt Diabetic: No Pt smoker: smoker  (1/2 ppd, decreased from 2 ppd, started smoking at age 66 yrs)   Past Medical History  Diagnosis Date  . Hypertension   . Asthma   . Kidney stones   . Head trauma   . CHF (congestive heart failure)   . Peripheral vascular disease    Past Surgical History  Procedure Laterality Date  . Back surgery    . Breast surgery    . Colonoscopy w/ polypectomy  1997  . Cardiac catheterization  05/2014  . Kidney stone surgery  1980's and 1990's    extraction  . Abdominal aortic endovascular stent graft N/A 07/20/2014    Procedure: ABDOMINAL AORTIC ENDOVASCULAR STENT GRAFT;  Surgeon: Serafina Mitchell, MD;  Location: [facility] OR;  Service: Vascular;  Laterality: N/A;  . Left heart catheterization with coronary angiogram N/A 06/11/2014    Procedure: LEFT HEART CATHETERIZATION WITH CORONARY ANGIOGRAM;  Surgeon: Blane Ohara, MD;  Location: [facility] CATH LAB;  Service: Cardiovascular;  Laterality: N/A;   Social History History  Substance Use Topics  .  Smoking status: Current Every Day Smoker -- 0.25 packs/day for 40 years    Types: Cigarettes  . Smokeless tobacco: Never Used  . Alcohol Use: Yes     Comment: 2 beers a month   Family History Family History  Problem Relation Age of Onset  . Heart disease Mother 31    Before age 23  . Heart attack Mother   . Heart attack Father   . Stroke Father   . Heart disease Father     Before age 55  . Cancer Sister     Brain  . Heart disease Sister     Before age 78  . Diabetes Sister   . Cancer Brother     Throat, Liver, Pancreatic  . Stroke Brother   . Diabetes Brother    Current Outpatient Prescriptions on File Prior to Visit  Medication Sig Dispense Refill  . albuterol (PROVENTIL HFA;VENTOLIN HFA) 108 (90 BASE) MCG/ACT inhaler Inhale 2 puffs into the lungs every 6 (six) hours as needed for wheezing or shortness of breath.    Marland Kitchen amLODipine (NORVASC) 10 MG tablet Take 10 mg by mouth at bedtime.     Marland Kitchen aspirin EC 81 MG tablet Take 81 mg by mouth at bedtime.     . metoprolol succinate (TOPROL-XL) 25 MG 24 hr tablet Take 12.5 mg by mouth at bedtime.    Marland Kitchen oxyCODONE (ROXICODONE) 5 MG immediate release tablet  Take 1 tablet (5 mg total) by mouth every 6 (six) hours as needed for severe pain. (Patient not taking: Reported on 02/25/2015) 15 tablet 0  . simvastatin (ZOCOR) 20 MG tablet Take 20 mg by mouth at bedtime.       Current Facility-Administered Medications on File Prior to Visit  Medication Dose Route Frequency Provider Last Rate Last Dose  . diazepam (VALIUM) tablet 5 mg  5 mg Oral Once [name], MD       No Known Allergies   ROS: See HPI for pertinent positives and negatives.  Physical Examination  Filed Vitals:   02/25/15 0942  BP: 130/86  Pulse: 80  Resp: 16  Height: 6' (1.829 m)  Weight: 188 lb (85.276 kg)  SpO2: 96%   Body mass index is 25.49 kg/(m^2).  General: A&O x 3, WD.  Pulmonary: Sym exp, fair air movt, CTAB, no rales, rhonchi, or wheezing.   Cardiac:  RRR, Nl S1, S2, no Murmurs appreciated  Vascular: Vessel Right Left  Radial 2+Palpable 2+Palpable  Carotid palpable without bruit Palpable without bruit  Aorta Not palpable N/A  Femoral 2+Palpable 2+Palpable  Popliteal Not palpable Not palpable  PT 2+Palpable 2+Palpable  DP 2+Palpable 2+Palpable   Gastrointestinal: soft, NTND, -G/R, - HSM, large (about 9 cm x 12 cm) reducible ventral hernia, - CVAT B.  Musculoskeletal: M/S 5/5 throughout, Extremities without ischemic changes.  Neurologic: Pain and light touch intact in extremities, Motor exam as listed above    Non-Invasive Vascular Imaging:  [facility] Duplex (Date: 02/25/2015) ABDOMINAL AORTA DUPLEX EVALUATION - POST ENDOVASCULAR REPAIR    INDICATION: Evaluation of endovascular abdominal repair of aortic aneurysm.    PREVIOUS INTERVENTION(S): Endovascular repair of abdominal aortic aneurysm 07/20/2014.    DUPLEX EXAM:      DIAMETER AP (cm) DIAMETER TRANSVERSE (cm) VELOCITIES (cm/sec)  Aorta 4.05 4.27 78  Right Common Iliac Not Visualized  Not Visualized    Left Common Iliac Not visualized  Not visualized     Comparison Study       Date DIAMETER AP (cm) DIAMETER TRANSVERSE (cm)  08/27/2014 CT 5.0 5.1     ADDITIONAL FINDINGS: Technically difficult and limited exam due to overlying bowel gas patterns.    IMPRESSION: Patent endovascular abdominal aortic repair with a maximum diameter of 4.05 x 4.27 cm.    Compared to the previous exam:  No prior exam performed at this facility for comparison.      CTA Abd/Pelvis Duplex (Date: 08/27/14) decrease in maximum diameter of the aneurysm from 5.7 down to 5.4. There was no evidence of endoleak.   Medical Decision Making  John Choi is a 64 y.o. male who presents s/p EVAR (Date: 07/20/2014.).  Pt is asymptomatic with decreased sac size compared to CTA on 08/27/14.  Pt requests referral to a general surgeon to evaluate and address his symptomatic/reducible large (about 12  cm x 9 cm) ventral hernia.  Unfortunately he continues to smoke but expressed motivation to quit. The patient was counseled re smoking cessation and given several free resources re smoking cessation.   I discussed with the patient the importance of surveillance of the endograft.  The next endograft duplex will be scheduled for 6 months.  The patient will follow up with [place] in 6 months with these studies.  I emphasized the importance of maximal medical management including strict control of blood pressure, blood glucose, and lipid levels, antiplatelet agents, obtaining regular exercise, and cessation of smoking.   The patient was  given information about AAA including signs, symptoms, treatment, and how to minimize the risk of enlargement and rupture of aneurysms.    Thank you for allowing [place] to participate in this patient's care.  Clemon Chambers, RN, MSN, FNP-C [facility] [place] Office: [facility] Physician: Trula Slade  02/25/2015, 9:57 AM

## 2015-02-26 ENCOUNTER — Other Ambulatory Visit: Payer: Self-pay

## 2015-02-26 DIAGNOSIS — I714 Abdominal aortic aneurysm, without rupture, unspecified: Secondary | ICD-10-CM

## 2015-02-26 DIAGNOSIS — Z48812 Encounter for surgical aftercare following surgery on the circulatory system: Secondary | ICD-10-CM

## 2015-03-14 DIAGNOSIS — K439 Ventral hernia without obstruction or gangrene: Secondary | ICD-10-CM | POA: Diagnosis not present

## 2015-05-07 DIAGNOSIS — K436 Other and unspecified ventral hernia with obstruction, without gangrene: Secondary | ICD-10-CM | POA: Diagnosis not present

## 2015-06-18 ENCOUNTER — Other Ambulatory Visit: Payer: Self-pay | Admitting: Cardiovascular Disease

## 2015-06-27 ENCOUNTER — Telehealth: Payer: Self-pay

## 2015-06-27 NOTE — Telephone Encounter (Signed)
Left message for patient that he needs yearly follow-up in order for Dr. Burt Knack to continue to fill his medications.  I advised in message that he can call back and schedule with Dr. Burt Knack for 10/11.

## 2015-06-27 NOTE — Telephone Encounter (Signed)
Patient called in about getting his Metoprolol refilled. He stated he has requested the refill from Dr Trula Slade office and they said he had to request it from Dr. Burt Knack office. In voicemail left by patient he doesn't understand why he needs an appointment with Dr. Burt Knack. His call back number is [phone].

## 2015-07-02 ENCOUNTER — Ambulatory Visit (INDEPENDENT_AMBULATORY_CARE_PROVIDER_SITE_OTHER): Payer: Medicare Other | Admitting: Cardiovascular Disease

## 2015-07-02 ENCOUNTER — Encounter: Payer: Self-pay | Admitting: Cardiovascular Disease

## 2015-07-02 VITALS — BP 126/74 | HR 74 | Ht 72.0 in | Wt 189.0 lb

## 2015-07-02 DIAGNOSIS — E785 Hyperlipidemia, unspecified: Secondary | ICD-10-CM | POA: Diagnosis not present

## 2015-07-02 MED ORDER — SIMVASTATIN 20 MG PO TABS: 20.0000 mg | ORAL_TABLET | Freq: Every day | ORAL | Status: DC

## 2015-07-02 MED ORDER — METOPROLOL SUCCINATE ER 25 MG PO TB24: 12.5000 mg | ORAL_TABLET | Freq: Every day | ORAL | Status: DC

## 2015-07-02 MED ORDER — AMLODIPINE BESYLATE 10 MG PO TABS: 10.0000 mg | ORAL_TABLET | Freq: Every day | ORAL | Status: DC

## 2015-07-02 NOTE — Progress Notes (Signed)
Cardiology Office Note Date:  07/02/2015   ID:  John Choi, DOB 08/25/1951, MRN [id number]  PCP:  [name]., MD  Cardiologist:  [name], MD    Chief Complaint  Patient presents with  . Follow-up    AAA   History of Present Illness: John Choi is a 64 y.o. male who presents for  Follow-up evaluation.   He has an abdominal aortic aneurysm and underwent AVA our last year by Dr. Trula Slade. This was uncomplicated. He was last seen in the vascular surgery office in June of this year and imaging studies were stable.   From a cardiac perspective he is doing well. He denies chest pain, chest pressure, shortness of breath, or leg swelling. He admits to easy bruising. Otherwise has no complaints. He has cut way back on smoking, but still smokes a few cigarettes every day.  Past Medical History  Diagnosis Date  . Hypertension   . Asthma   . Kidney stones   . Head trauma   . CHF (congestive heart failure) ([place])   . Peripheral vascular disease [facility])     Past Surgical History  Procedure Laterality Date  . Back surgery    . Breast surgery    . Colonoscopy w/ polypectomy  1997  . Cardiac catheterization  05/2014  . Kidney stone surgery  1980's and 1990's    extraction  . Abdominal aortic endovascular stent graft N/A 07/20/2014    Procedure: ABDOMINAL AORTIC ENDOVASCULAR STENT GRAFT;  Surgeon: Serafina Mitchell, MD;  Location: [facility] OR;  Service: Vascular;  Laterality: N/A;  . Left heart catheterization with coronary angiogram N/A 06/11/2014    Procedure: LEFT HEART CATHETERIZATION WITH CORONARY ANGIOGRAM;  Surgeon: Blane Ohara, MD;  Location: [facility] CATH LAB;  Service: Cardiovascular;  Laterality: N/A;    Current Outpatient Prescriptions  Medication Sig Dispense Refill  . albuterol (PROVENTIL HFA;VENTOLIN HFA) 108 (90 BASE) MCG/ACT inhaler Inhale 2 puffs into the lungs every 6 (six) hours as needed for wheezing or shortness of breath.    Marland Kitchen amLODipine (NORVASC) 10 MG  tablet Take 1 tablet (10 mg total) by mouth at bedtime. 90 tablet 3  . aspirin EC 81 MG tablet Take 81 mg by mouth at bedtime.     . metoprolol succinate (TOPROL-XL) 25 MG 24 hr tablet Take 0.5 tablets (12.5 mg total) by mouth daily. 90 tablet 3  . oxyCODONE (ROXICODONE) 5 MG immediate release tablet Take 1 tablet (5 mg total) by mouth every 6 (six) hours as needed for severe pain. 15 tablet 0  . simvastatin (ZOCOR) 20 MG tablet Take 1 tablet (20 mg total) by mouth at bedtime. 90 tablet 3   Current Facility-Administered Medications  Medication Dose Route Frequency Provider Last Rate Last Dose  . diazepam (VALIUM) tablet 5 mg  5 mg Oral Once [name], MD        Allergies:   Review of patient's allergies indicates no known allergies.   Social History:  The patient  reports that he has been smoking Cigarettes.  He has a 10 pack-year smoking history. He has never used smokeless tobacco. He reports that he drinks alcohol. He reports that he does not use illicit drugs.   Family History:  The patient's  family history includes Cancer in his brother and sister; Diabetes in his brother and sister; Heart attack in his father and mother; Heart disease in his father and sister; Heart disease (age of onset: 77) in his mother; Stroke  in his brother and father.   ROS:  Please see the history of present illness.  Otherwise, review of systems is positive for easy bruising.  All other systems are reviewed and negative.   PHYSICAL EXAM: VS:  BP 126/74 mmHg  Pulse 74  Ht 6' (1.829 m)  Wt 189 lb (85.73 kg)  BMI 25.63 kg/m2 , BMI Body mass index is 25.63 kg/(m^2). GEN: Well nourished, well developed, in no acute distress HEENT: normal Neck: no JVD, no masses. No carotid bruits Cardiac: RRR without murmur or gallop                Respiratory:  clear to auscultation bilaterally, normal work of breathing GI: soft, nontender, nondistended, + BS MS: no deformity or atrophy Ext: no pretibial edema, pedal  pulses 2+= bilaterally Skin: warm and dry, no rash Neuro:  Strength and sensation are intact Psych: euthymic mood, full affect  EKG:  EKG is ordered today. The ekg ordered today shows  Normal sinus rhythm 74 bpm , right bundle branch block, left anterior fascicular block.  Recent Labs: 07/12/2014: ALT 15 07/20/2014: Magnesium 1.9 07/21/2014: BUN 16; Creatinine, Ser 1.31; Hemoglobin 13.5; Platelets 200; Potassium 4.4; Sodium 140   Lipid Panel     Component Value Date/Time   CHOL * 11/23/2009 0155    207        ATP III CLASSIFICATION:  <200     mg/dL   Desirable  200-239  mg/dL   Borderline High  >=240    mg/dL   High          TRIG 320* 11/23/2009 0155   HDL 29* 11/23/2009 0155   CHOLHDL 7.1 11/23/2009 0155   VLDL 64* 11/23/2009 0155   LDLCALC * 11/23/2009 0155    114        Total Cholesterol/HDL:CHD Risk Coronary Heart Disease Risk Table                     Men   Women  1/2 Average Risk   3.4   3.3  Average Risk       5.0   4.4  2 X Average Risk   9.6   7.1  3 X Average Risk  23.4   11.0        Use the calculated Patient Ratio above and the CHD Risk Table to determine the patient's CHD Risk.        ATP III CLASSIFICATION (LDL):  <100     mg/dL   Optimal  100-129  mg/dL   Near or Above                    Optimal  130-159  mg/dL   Borderline  160-189  mg/dL   High  >190     mg/dL   Very High      Wt Readings from Last 3 Encounters:  07/02/15 189 lb (85.73 kg)  02/25/15 188 lb (85.276 kg)  08/27/14 180 lb (81.647 kg)     Cardiac Studies Reviewed: Cardiac Cath 06/11/2014: Final Conclusions:  1. Widely patent coronary arteries, left dominant 2. Normal LV function 3. Infrarenal abdominal aortic aneurysm   ASSESSMENT AND PLAN: 1.  AAA: followed by Dr Trula Slade. Last imaging reviewed. He is on ASA and a statin drug.   2. HTN: stable on amlodipine and metoprolol succinate  3. Tobacco abuse: discussed need for complete cessation.   4. Hyperlipidemia:  followed by PCP. Treated with simvastatin.  Current medicines are reviewed with the patient today.  The patient does not have concerns regarding medicines.  Labs/ tests ordered today include:   Orders Placed This Encounter  Procedures  . EKG 12-Lead    Disposition:   FU one year  Signed, [name], MD  07/02/2015 1:42 PM    [facility] [address], [place], [place]  [address] Phone: [phone]; Fax: [phone]

## 2015-07-02 NOTE — Patient Instructions (Signed)

## 2015-07-20 ENCOUNTER — Other Ambulatory Visit: Payer: Self-pay | Admitting: Cardiovascular Disease

## 2015-09-04 ENCOUNTER — Encounter: Payer: Self-pay | Admitting: Family

## 2015-09-09 ENCOUNTER — Ambulatory Visit (HOSPITAL_COMMUNITY)
Admission: RE | Admit: 2015-09-09 | Discharge: 2015-09-09 | Disposition: A | Discharge status: Home / Self Care | Payer: Medicare Other | Source: Ambulatory Visit | Attending: Surgery | Admitting: Surgery

## 2015-09-09 ENCOUNTER — Encounter: Payer: Self-pay | Admitting: Family

## 2015-09-09 ENCOUNTER — Ambulatory Visit (INDEPENDENT_AMBULATORY_CARE_PROVIDER_SITE_OTHER): Payer: Medicare Other | Admitting: Family

## 2015-09-09 VITALS — BP 124/80 | HR 84 | Temp 97.0°F | Resp 14 | Ht 72.0 in | Wt 191.0 lb

## 2015-09-09 DIAGNOSIS — Z48812 Encounter for surgical aftercare following surgery on the circulatory system: Secondary | ICD-10-CM | POA: Diagnosis not present

## 2015-09-09 DIAGNOSIS — I714 Abdominal aortic aneurysm, without rupture, unspecified: Secondary | ICD-10-CM

## 2015-09-09 DIAGNOSIS — Z720 Tobacco use: Secondary | ICD-10-CM

## 2015-09-09 DIAGNOSIS — F17200 Nicotine dependence, unspecified, uncomplicated: Secondary | ICD-10-CM

## 2015-09-09 DIAGNOSIS — Z95828 Presence of other vascular implants and grafts: Secondary | ICD-10-CM

## 2015-09-09 NOTE — Progress Notes (Signed)
Filed Vitals:   09/09/15 0827  BP: 124/80  Pulse: 84  Temp: 97 F (36.1 C)  Resp: 14

## 2015-09-09 NOTE — Progress Notes (Signed)
[facility] [place]  Established [facility]  History of Present Illness  John Choi is a 64 y.o. (Aug 08, 1951) male patient of Dr. Trula Slade who is s/p endovascular repair of an abdominal aortic aneurysm on 07/20/2014. His postoperative course was uncomplicated and he was discharged to home on postoperative day 1. He returns today for routine follow up.  08/27/14 CT angiogram showed a decrease in maximum diameter of the aneurysm from 5.7 down to 5.4. There was no evidence of endoleak.  Dr. 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  He had a ventral hernia repaired in August, 2016, states doing well after this.   The pt denies any back or abdominal pain. Pt denies claudication symptoms with walking, denies non healing wounds. He denies any history of stroke or TIA.   He takes a daily 81 mg ASA, statin, and beta blocker.   Pt Diabetic: No Pt smoker: smoker (2 cigarettes/day, decreased from 2 ppd, started smoking at age 63 yrs), trying for 40 years to quit    Past Medical History  Diagnosis Date  . Hypertension   . Asthma   . Kidney stones   . Head trauma   . CHF (congestive heart failure) ([place])   . Peripheral vascular disease [facility])    Past Surgical History  Procedure Laterality Date  . Back surgery    . Breast surgery    . Colonoscopy w/ polypectomy  1997  . Cardiac catheterization  05/2014  . Kidney stone surgery  1980's and 1990's    extraction  . Abdominal aortic endovascular stent graft N/A 07/20/2014    Procedure: ABDOMINAL AORTIC ENDOVASCULAR STENT GRAFT;  Surgeon: Serafina Mitchell, MD;  Location: [facility] OR;  Service: Vascular;  Laterality: N/A;  . Left heart catheterization with coronary angiogram N/A 06/11/2014    Procedure: LEFT HEART CATHETERIZATION WITH CORONARY ANGIOGRAM;  Surgeon: Blane Ohara, MD;  Location: [facility] CATH LAB;  Service: Cardiovascular;  Laterality: N/A;   Social History Social History  Substance Use  Topics  . Smoking status: Current Every Day Smoker -- 0.25 packs/day for 40 years    Types: Cigarettes  . Smokeless tobacco: Never Used     Comment: Per patient, smokes two cigarettes daily.   . Alcohol Use: No   Family History Family History  Problem Relation Age of Onset  . Heart disease Mother 42    Before age 37  . Heart attack Mother   . Heart attack Father   . Stroke Father   . Heart disease Father     Before age 52  . Cancer Sister     Brain  . Heart disease Sister     Before age 12  . Diabetes Sister   . Cancer Brother     Throat, Liver, Pancreatic  . Stroke Brother   . Diabetes Brother    Current Outpatient Prescriptions on File Prior to Visit  Medication Sig Dispense Refill  . albuterol (PROVENTIL HFA;VENTOLIN HFA) 108 (90 BASE) MCG/ACT inhaler Inhale 2 puffs into the lungs every 6 (six) hours as needed for wheezing or shortness of breath.    Marland Kitchen amLODipine (NORVASC) 10 MG tablet Take 1 tablet (10 mg total) by mouth at bedtime. 90 tablet 3  . aspirin EC 81 MG tablet Take 81 mg by mouth at bedtime.     . metoprolol succinate (TOPROL-XL) 25 MG 24 hr tablet Take 0.5 tablets (12.5 mg total) by mouth daily. 90 tablet 3  .  simvastatin (ZOCOR) 20 MG tablet Take 1 tablet (20 mg total) by mouth at bedtime. 90 tablet 3  . oxyCODONE (ROXICODONE) 5 MG immediate release tablet Take 1 tablet (5 mg total) by mouth every 6 (six) hours as needed for severe pain. (Patient not taking: Reported on 09/09/2015) 15 tablet 0   Current Facility-Administered Medications on File Prior to Visit  Medication Dose Route Frequency Provider Last Rate Last Dose  . diazepam (VALIUM) tablet 5 mg  5 mg Oral Once [name], MD       No Known Allergies   ROS: See HPI for pertinent positives and negatives.  Physical Examination  Filed Vitals:   09/09/15 0827  BP: 124/80  Pulse: 84  Temp: 97 F (36.1 C)  TempSrc: Oral  Resp: 14  Height: 6' (1.829 m)  Weight: 191 lb (86.637 kg)  SpO2: 96%    Body mass index is 25.9 kg/(m^2).  General: A&O x 3, WD.  Pulmonary: Sym exp, fair air movt, CTAB, no rales, rhonchi, or wheezing.   Cardiac: RRR, Nl S1, S2, no Murmurs appreciated  Vascular: Vessel Right Left  Radial 2+Palpable 2+Palpable  Carotid palpable without bruit Palpable without bruit  Aorta Not palpable N/A  Femoral 2+Palpable 2+Palpable  Popliteal Not palpable Not palpable  PT 2+Palpable 2+Palpable  DP 2+Palpable 2+Palpable   Gastrointestinal: soft, NTND, -G/R, - HSM, large (about 9 cm x 12 cm) reducible ventral hernia, - CVAT B.  Musculoskeletal: M/S 5/5 throughout, Extremities without ischemic changes.  Neurologic: Pain and light touch intact in extremities, Motor exam as listed above           Non-Invasive Vascular Imaging  EVAR Duplex (Date: 09/09/2015)  AAA sac size: 4.21 cm   no endoleak detected  02/25/15: 4.27 cm largest aortic diameter  Technically difficult due to overlying bowel gas.  Common iliac arteries not visualized     Medical Decision Making  John Choi is a 64 y.o. male who presents s/p EVAR (Date: 09/09/2015).  Pt is asymptomatic with no change in sac size. The patient was counseled re smoking cessation and given several free resources re smoking cessation.   I discussed with the patient the importance of surveillance of the endograft.  The next endograft duplex will be scheduled for 12 months.  The patient will follow up with [place] in 12 months with these studies.  I emphasized the importance of maximal medical management including strict control of blood pressure, blood glucose, and lipid levels, antiplatelet agents, obtaining regular exercise, and cessation of smoking.   Thank you for allowing [place] to participate in this patient's care.  Clemon Chambers, RN, MSN, FNP-C [facility] [place] Office: [phone]  Clinic Physician: Kellie Simmering  09/09/2015, 8:53 AM

## 2015-09-09 NOTE — Patient Instructions (Signed)
Smoking Cessation, Tips for Success If you are ready to quit smoking, congratulations! You have chosen to help yourself be healthier. Cigarettes bring nicotine, tar, carbon monoxide, and other irritants into your body. Your lungs, heart, and blood vessels will be able to work better without these poisons. There are many different ways to quit smoking. Nicotine gum, nicotine patches, a nicotine inhaler, or nicotine nasal spray can help with physical craving. Hypnosis, support groups, and medicines help break the habit of smoking. WHAT THINGS CAN I DO TO MAKE QUITTING EASIER?  Here are some tips to help you quit for good:  Pick a date when you will quit smoking completely. Tell all of your friends and family about your plan to quit on that date.  Do not try to slowly cut down on the number of cigarettes you are smoking. Pick a quit date and quit smoking completely starting on that day.  Throw away all cigarettes.   Clean and remove all ashtrays from your home, work, and car.  On a card, write down your reasons for quitting. Carry the card with you and read it when you get the urge to smoke.  Cleanse your body of nicotine. Drink enough water and fluids to keep your urine clear or pale yellow. Do this after quitting to flush the nicotine from your body.  Learn to predict your moods. Do not let a bad situation be your excuse to have a cigarette. Some situations in your life might tempt you into wanting a cigarette.  Never have "just one" cigarette. It leads to wanting another and another. Remind yourself of your decision to quit.  Change habits associated with smoking. If you smoked while driving or when feeling stressed, try other activities to replace smoking. Stand up when drinking your coffee. Brush your teeth after eating. Sit in a different chair when you read the paper. Avoid alcohol while trying to quit, and try to drink fewer caffeinated beverages. Alcohol and caffeine may urge you to  smoke.  Avoid foods and drinks that can trigger a desire to smoke, such as sugary or spicy foods and alcohol.  Ask people who smoke not to smoke around you.  Have something planned to do right after eating or having a cup of coffee. For example, plan to take a walk or exercise.  Try a relaxation exercise to calm you down and decrease your stress. Remember, you may be tense and nervous for the first 2 weeks after you quit, but this will pass.  Find new activities to keep your hands busy. Play with a pen, coin, or rubber band. Doodle or draw things on paper.  Brush your teeth right after eating. This will help cut down on the craving for the taste of tobacco after meals. You can also try mouthwash.   Use oral substitutes in place of cigarettes. Try using lemon drops, carrots, cinnamon sticks, or chewing gum. Keep them handy so they are available when you have the urge to smoke.  When you have the urge to smoke, try deep breathing.  Designate your home as a nonsmoking area.  If you are a heavy smoker, ask your health care provider about a prescription for nicotine chewing gum. It can ease your withdrawal from nicotine.  Reward yourself. Set aside the cigarette money you save and buy yourself something nice.  Look for support from others. Join a support group or smoking cessation program. Ask someone at home or at work to help you with your plan   to quit smoking.  Always ask yourself, "Do I need this cigarette or is this just a reflex?" Tell yourself, "Today, I choose not to smoke," or "I do not want to smoke." You are reminding yourself of your decision to quit.  Do not replace cigarette smoking with electronic cigarettes (commonly called e-cigarettes). The safety of e-cigarettes is unknown, and some may contain harmful chemicals.  If you relapse, do not give up! Plan ahead and think about what you will do the next time you get the urge to smoke. HOW WILL I FEEL WHEN I QUIT SMOKING? You  may have symptoms of withdrawal because your body is used to nicotine (the addictive substance in cigarettes). You may crave cigarettes, be irritable, feel very hungry, cough often, get headaches, or have difficulty concentrating. The withdrawal symptoms are only temporary. They are strongest when you first quit but will go away within 10-14 days. When withdrawal symptoms occur, stay in control. Think about your reasons for quitting. Remind yourself that these are signs that your body is healing and getting used to being without cigarettes. Remember that withdrawal symptoms are easier to treat than the major diseases that smoking can cause.  Even after the withdrawal is over, expect periodic urges to smoke. However, these cravings are generally short lived and will go away whether you smoke or not. Do not smoke! WHAT RESOURCES ARE AVAILABLE TO HELP ME QUIT SMOKING? Your health care provider can direct you to community resources or hospitals for support, which may include:  Group support.  Education.  Hypnosis.  Therapy.   This information is not intended to replace advice given to you by your health care provider. Make sure you discuss any questions you have with your health care provider.   Document Released: 06/05/2004 Document Revised: 09/28/2014 Document Reviewed: 02/23/2013 Elsevier Interactive Patient Education 2016 Elsevier Inc[employer]    Steps to Quit Smoking  Smoking tobacco can be harmful to your health and can affect almost every organ in your body. Smoking puts you, and those around you, at risk for developing many serious chronic diseases. Quitting smoking is difficult, but it is one of the best things that you can do for your health. It is never too late to quit. WHAT ARE THE BENEFITS OF QUITTING SMOKING? When you quit smoking, you lower your risk of developing serious diseases and conditions, such as:  Lung cancer or lung disease, such as COPD.  Heart disease.  Stroke.  Heart  attack.  Infertility.  Osteoporosis and bone fractures. Additionally, symptoms such as coughing, wheezing, and shortness of breath may get better when you quit. You may also find that you get sick less often because your body is stronger at fighting off colds and infections. If you are pregnant, quitting smoking can help to reduce your chances of having a baby of low birth weight. HOW DO I GET READY TO QUIT? When you decide to quit smoking, create a plan to make sure that you are successful. Before you quit:  Pick a date to quit. Set a date within the next two weeks to give you time to prepare.  Write down the reasons why you are quitting. Keep this list in places where you will see it often, such as on your bathroom mirror or in your car or wallet.  Identify the people, places, things, and activities that make you want to smoke (triggers) and avoid them. Make sure to take these actions:  Throw away all cigarettes at home, at work,   and in your car.  Throw away smoking accessories, such as ashtrays and lighters.  Clean your car and make sure to empty the ashtray.  Clean your home, including curtains and carpets.  Tell your family, friends, and coworkers that you are quitting. Support from your loved ones can make quitting easier.  Talk with your health care provider about your options for quitting smoking.  Find out what treatment options are covered by your health insurance. WHAT STRATEGIES CAN I USE TO QUIT SMOKING?  Talk with your healthcare provider about different strategies to quit smoking. Some strategies include:  Quitting smoking altogether instead of gradually lessening how much you smoke over a period of time. Research shows that quitting "cold turkey" is more successful than gradually quitting.  Attending in-person counseling to help you build problem-solving skills. You are more likely to have success in quitting if you attend several counseling sessions. Even short  sessions of 10 minutes can be effective.  Finding resources and support systems that can help you to quit smoking and remain smoke-free after you quit. These resources are most helpful when you use them often. They can include:  Online chats with a counselor.  Telephone quitlines.  Printed self-help materials.  Support groups or group counseling.  Text messaging programs.  Mobile phone applications.  Taking medicines to help you quit smoking. (If you are pregnant or breastfeeding, talk with your health care provider first.) Some medicines contain nicotine and some do not. Both types of medicines help with cravings, but the medicines that include nicotine help to relieve withdrawal symptoms. Your health care provider may recommend:  Nicotine patches, gum, or lozenges.  Nicotine inhalers or sprays.  Non-nicotine medicine that is taken by mouth. Talk with your health care provider about combining strategies, such as taking medicines while you are also receiving in-person counseling. Using these two strategies together makes you more likely to succeed in quitting than if you used either strategy on its own. If you are pregnant or breastfeeding, talk with your health care provider about finding counseling or other support strategies to quit smoking. Do not take medicine to help you quit smoking unless told to do so by your health care provider. WHAT THINGS CAN I DO TO MAKE IT EASIER TO QUIT? Quitting smoking might feel overwhelming at first, but there is a lot that you can do to make it easier. Take these important actions:  Reach out to your family and friends and ask that they support and encourage you during this time. Call telephone quitlines, reach out to support groups, or work with a counselor for support.  Ask people who smoke to avoid smoking around you.  Avoid places that trigger you to smoke, such as bars, parties, or smoke-break areas at work.  Spend time around people who do  not smoke.  Lessen stress in your life, because stress can be a smoking trigger for some people. To lessen stress, try:  Exercising regularly.  Deep-breathing exercises.  Yoga.  Meditating.  Performing a body scan. This involves closing your eyes, scanning your body from head to toe, and noticing which parts of your body are particularly tense. Purposefully relax the muscles in those areas.  Download or purchase mobile phone or tablet apps (applications) that can help you stick to your quit plan by providing reminders, tips, and encouragement. There are many free apps, such as QuitGuide from the CDC (Centers for Disease Control and Prevention). You can find other support for quitting smoking (smoking   cessation) through smokefree.gov and other websites. HOW WILL I FEEL WHEN I QUIT SMOKING? Within the first 24 hours of quitting smoking, you may start to feel some withdrawal symptoms. These symptoms are usually most noticeable 2-3 days after quitting, but they usually do not last beyond 2-3 weeks. Changes or symptoms that you might experience include:  Mood swings.  Restlessness, anxiety, or irritation.  Difficulty concentrating.  Dizziness.  Strong cravings for sugary foods in addition to nicotine.  Mild weight gain.  Constipation.  Nausea.  Coughing or a sore throat.  Changes in how your medicines work in your body.  A depressed mood.  Difficulty sleeping (insomnia). After the first 2-3 weeks of quitting, you may start to notice more positive results, such as:  Improved sense of smell and taste.  Decreased coughing and sore throat.  Slower heart rate.  Lower blood pressure.  Clearer skin.  The ability to breathe more easily.  Fewer sick days. Quitting smoking is very challenging for most people. Do not get discouraged if you are not successful the first time. Some people need to make many attempts to quit before they achieve long-term success. Do your best to  stick to your quit plan, and talk with your health care provider if you have any questions or concerns.   This information is not intended to replace advice given to you by your health care provider. Make sure you discuss any questions you have with your health care provider.   Document Released: 09/01/2001 Document Revised: 01/22/2015 Document Reviewed: 01/22/2015 Elsevier Interactive Patient Education 2016 Elsevier Inc.  

## 2015-10-24 DIAGNOSIS — Z6827 Body mass index (BMI) 27.0-27.9, adult: Secondary | ICD-10-CM | POA: Diagnosis not present

## 2015-10-24 DIAGNOSIS — Z1389 Encounter for screening for other disorder: Secondary | ICD-10-CM | POA: Diagnosis not present

## 2015-10-24 DIAGNOSIS — L989 Disorder of the skin and subcutaneous tissue, unspecified: Secondary | ICD-10-CM | POA: Diagnosis not present

## 2015-10-24 DIAGNOSIS — I714 Abdominal aortic aneurysm, without rupture: Secondary | ICD-10-CM | POA: Diagnosis not present

## 2015-10-24 DIAGNOSIS — G894 Chronic pain syndrome: Secondary | ICD-10-CM | POA: Diagnosis not present

## 2015-10-24 DIAGNOSIS — J45909 Unspecified asthma, uncomplicated: Secondary | ICD-10-CM | POA: Diagnosis not present

## 2015-10-24 DIAGNOSIS — I10 Essential (primary) hypertension: Secondary | ICD-10-CM | POA: Diagnosis not present

## 2015-11-29 ENCOUNTER — Ambulatory Visit (INDEPENDENT_AMBULATORY_CARE_PROVIDER_SITE_OTHER): Payer: Medicare Other | Admitting: Urology

## 2015-11-29 ENCOUNTER — Ambulatory Visit (HOSPITAL_COMMUNITY)
Admission: RE | Admit: 2015-11-29 | Discharge: 2015-11-29 | Disposition: A | Discharge status: Home / Self Care | Payer: Medicare Other | Source: Ambulatory Visit | Attending: Urology | Admitting: Urology

## 2015-11-29 ENCOUNTER — Other Ambulatory Visit: Payer: Self-pay | Admitting: Urology

## 2015-11-29 DIAGNOSIS — N503 Cyst of epididymis: Secondary | ICD-10-CM | POA: Diagnosis not present

## 2015-11-29 DIAGNOSIS — R1909 Other intra-abdominal and pelvic swelling, mass and lump: Secondary | ICD-10-CM

## 2015-12-04 ENCOUNTER — Ambulatory Visit (INDEPENDENT_AMBULATORY_CARE_PROVIDER_SITE_OTHER): Payer: Medicare Other | Admitting: Urology

## 2015-12-04 DIAGNOSIS — R1909 Other intra-abdominal and pelvic swelling, mass and lump: Secondary | ICD-10-CM

## 2015-12-13 DIAGNOSIS — Z125 Encounter for screening for malignant neoplasm of prostate: Secondary | ICD-10-CM | POA: Diagnosis not present

## 2015-12-13 DIAGNOSIS — E782 Mixed hyperlipidemia: Secondary | ICD-10-CM | POA: Diagnosis not present

## 2015-12-13 DIAGNOSIS — Z1389 Encounter for screening for other disorder: Secondary | ICD-10-CM | POA: Diagnosis not present

## 2015-12-13 DIAGNOSIS — E663 Overweight: Secondary | ICD-10-CM | POA: Diagnosis not present

## 2015-12-13 DIAGNOSIS — Z0000 Encounter for general adult medical examination without abnormal findings: Secondary | ICD-10-CM | POA: Diagnosis not present

## 2015-12-13 DIAGNOSIS — Z6827 Body mass index (BMI) 27.0-27.9, adult: Secondary | ICD-10-CM | POA: Diagnosis not present

## 2015-12-17 ENCOUNTER — Other Ambulatory Visit: Payer: Self-pay | Admitting: General Surgery

## 2015-12-17 DIAGNOSIS — R1909 Other intra-abdominal and pelvic swelling, mass and lump: Secondary | ICD-10-CM | POA: Diagnosis not present

## 2015-12-26 ENCOUNTER — Ambulatory Visit
Admission: RE | Admit: 2015-12-26 | Discharge: 2015-12-26 | Disposition: A | Discharge status: Home / Self Care | Payer: Medicare Other | Source: Ambulatory Visit | Attending: General Surgery | Admitting: General Surgery

## 2015-12-26 DIAGNOSIS — R1909 Other intra-abdominal and pelvic swelling, mass and lump: Secondary | ICD-10-CM

## 2015-12-26 DIAGNOSIS — R1900 Intra-abdominal and pelvic swelling, mass and lump, unspecified site: Secondary | ICD-10-CM | POA: Diagnosis not present

## 2015-12-26 MED ORDER — IOPAMIDOL (ISOVUE-300) INJECTION 61%
100.0000 mL | Freq: Once | INTRAVENOUS | Status: AC | PRN
  Administered 2015-12-26: 100 mL via INTRAVENOUS

## 2016-03-19 ENCOUNTER — Encounter: Payer: Self-pay | Admitting: Cardiology

## 2016-07-09 ENCOUNTER — Other Ambulatory Visit: Payer: Self-pay | Admitting: Cardiovascular Disease

## 2016-07-09 DIAGNOSIS — E785 Hyperlipidemia, unspecified: Secondary | ICD-10-CM

## 2016-07-16 ENCOUNTER — Ambulatory Visit (INDEPENDENT_AMBULATORY_CARE_PROVIDER_SITE_OTHER): Payer: Medicare Other | Admitting: Cardiovascular Disease

## 2016-07-16 ENCOUNTER — Encounter: Payer: Self-pay | Admitting: Cardiovascular Disease

## 2016-07-16 ENCOUNTER — Encounter (INDEPENDENT_AMBULATORY_CARE_PROVIDER_SITE_OTHER): Payer: Self-pay

## 2016-07-16 VITALS — BP 130/80 | HR 88 | Ht 72.0 in | Wt 198.2 lb

## 2016-07-16 DIAGNOSIS — E782 Mixed hyperlipidemia: Secondary | ICD-10-CM

## 2016-07-16 DIAGNOSIS — I10 Essential (primary) hypertension: Secondary | ICD-10-CM | POA: Diagnosis not present

## 2016-07-16 NOTE — Patient Instructions (Signed)

## 2016-07-16 NOTE — Progress Notes (Signed)
Cardiology Office Note Date:  07/17/2016   ID:  John Choi, DOB Feb 05, 1951, MRN [id number]  PCP:  [name]., MD    Cardiologist:  [name], MD                  Chief Complaint  Patient presents with  . Hypertension   History of Present Illness: John Choi is a 65 y.o. male who presents for follow-up evaluation. The patient has a history of endovascular abdominal aortic aneurysm repair in 2015. He underwent a preoperative cardiac catheterization after an abnormal stress test and was found to have normal coronary arteries. He's also followed for hypertension and hyperlipidemia. He continues to smoke about 2 cigarettes per day. Clinically he is doing well. Today, he denies symptoms of palpitations, chest pain, shortness of breath, orthopnea, PND, lower extremity edema, dizziness, or syncope.      Past Medical History:  Diagnosis Date  . Asthma   . CHF (congestive heart failure) (John Choi)   . Head trauma   . Hypertension   . Kidney stones   . Peripheral vascular disease [facility])          Past Surgical History:  Procedure Laterality Date  . ABDOMINAL AORTIC ENDOVASCULAR STENT GRAFT N/A 07/20/2014   Procedure: ABDOMINAL AORTIC ENDOVASCULAR STENT GRAFT;  Surgeon: Serafina Mitchell, MD;  Location: [facility];  Service: Vascular;  Laterality: N/A;  . BACK SURGERY    . BREAST SURGERY    . CARDIAC CATHETERIZATION  05/2014  . COLONOSCOPY W/ POLYPECTOMY  1997  . KIDNEY STONE SURGERY  1980's and 1990's   extraction  . LEFT HEART CATHETERIZATION WITH CORONARY ANGIOGRAM N/A 06/11/2014   Procedure: LEFT HEART CATHETERIZATION WITH CORONARY ANGIOGRAM;  Surgeon: Blane Ohara, MD;  Location: [facility] CATH LAB;  Service: Cardiovascular;  Laterality: N/A;          Current Outpatient Prescriptions  Medication Sig Dispense Refill  . albuterol (PROVENTIL HFA;VENTOLIN HFA) 108 (90 BASE) MCG/ACT inhaler Inhale 2 puffs into the lungs every 6 (six) hours as needed  for wheezing or shortness of breath.    Marland Kitchen amLODipine (NORVASC) 10 MG tablet Take 1 tablet (10 mg total) by mouth at bedtime. 90 tablet 3  . aspirin EC 81 MG tablet Take 81 mg by mouth at bedtime.     . metoprolol succinate (TOPROL-XL) 25 MG 24 hr tablet Take 0.5 tablets (12.5 mg total) by mouth daily. *Please call and schedule a one year follow up appointment* 45 tablet 0  . simvastatin (ZOCOR) 20 MG tablet Take 1 tablet (20 mg total) by mouth at bedtime. 90 tablet 3            Current Facility-Administered Medications  Medication Dose Route Frequency Provider Last Rate Last Dose  . diazepam (VALIUM) tablet 5 mg  5 mg Oral Once [name], MD        Allergies:   Review of patient's allergies indicates no known allergies.   Social History:  The patient  reports that he has been smoking Cigarettes.  He has a 10.00 pack-year smoking history. He has never used smokeless tobacco. He reports that he does not drink alcohol or use drugs.   Family History:  The patient's  family history includes Cancer in his brother and sister; Diabetes in his brother and sister; Heart attack in his father and mother; Heart disease in his father and sister; Heart disease (age of onset: 65) in his mother; Stroke in his brother and  father.    ROS:  Please see the history of present illness.  Otherwise, review of systems is positive for easy bruising.  All other systems are reviewed and negative.   PHYSICAL EXAM: VS:  BP 130/80   Pulse 88   Ht 6' (1.829 m)   Wt 198 lb 3.2 oz (89.9 kg)   BMI 26.88 kg/m  , BMI Body mass index is 26.88 kg/m. GEN: Well nourished, well developed, in no acute distress  HEENT: normal  Neck: no JVD, no masses. No carotid bruits Cardiac: RRR without murmur or gallop                Respiratory:  clear to auscultation bilaterally, normal work of breathing GI: soft, nontender, nondistended, + BS MS: no deformity or atrophy  Ext: no pretibial edema, pedal pulses 2+=  bilaterally Skin: warm and dry, no rash Neuro:  Strength and sensation are intact Psych: euthymic mood, full affect  EKG:  EKG is not ordered today.  Recent Labs: No results found for requested labs within last 8760 hours.   Lipid Panel  Labs (Brief)           Component Value Date/Time   CHOL (H) 11/23/2009 0155    207        ATP III CLASSIFICATION:  <200     mg/dL   Desirable  200-239  mg/dL   Borderline High  >=240    mg/dL   High          TRIG 320 (H) 11/23/2009 0155   HDL 29 (L) 11/23/2009 0155   CHOLHDL 7.1 11/23/2009 0155   VLDL 64 (H) 11/23/2009 0155   LDLCALC (H) 11/23/2009 0155    114        Total Cholesterol/HDL:CHD Risk Coronary Heart Disease Risk Table                     Men   Women  1/2 Average Risk   3.4   3.3  Average Risk       5.0   4.4  2 X Average Risk   9.6   7.1  3 X Average Risk  23.4   11.0        Use the calculated Patient Ratio above and the CHD Risk Table to determine the patient's CHD Risk.        ATP III CLASSIFICATION (LDL):  <100     mg/dL   Optimal  100-129  mg/dL   Near or Above                    Optimal  130-159  mg/dL   Borderline  160-189  mg/dL   High  >190     mg/dL   Very High           Wt Readings from Last 3 Encounters:  07/16/16 198 lb 3.2 oz (89.9 kg)  09/09/15 191 lb (86.6 kg)  07/02/15 189 lb (85.7 kg)     Cardiac Studies Reviewed: Cardiac Cath 06/11/2014: Procedure: Left Heart Cath, Selective Coronary Angiography, LV angiography, abdominal aortic angiography  Indication: CCS class III anginal symptoms, and known vascular disease, multiple cardiac risk factors.  Procedural Details: The right wrist was prepped, draped, and anesthetized with 1% lidocaine. Using the modified Seldinger technique, a 5/6French Slender sheath was introduced into the right radial artery. 3 mg of verapamil was administered through the sheath, weight-based unfractionated heparin was administered intravenously.  Standard Judkins catheters were  used for selective coronary angiography and left ventriculography. The pigtail catheter was advanced into the descending thoracic aorta after ventriculography and then into the suprarenal abdominal aorta for abdominal aortic angiography. Catheter exchanges were performed over an exchange length guidewire. There were no immediate procedural complications. A TR band was used for radial hemostasis at the completion of the procedure. The patient was transferred to the post catheterization recovery area for further monitoring.  Procedural Findings: Hemodynamics: AO 100/64 LV 101/8  Coronary angiography: Coronary dominance:right  Left mainstem:The left main is widely patent with no obstructive disease. The vessel divides into the LAD and left circumflex the  Left anterior descending (LAD):The LAD courses to the apex. The first diagonal is large in caliber and widely patent. There is no obstructive disease.  Left circumflex (LCx):The left circumflex is dominant. The vessel supplies a large first obtuse marginal without stenosis. The AV groove circumflex extends down and supplies a PLA and PDA branch. There are no stenoses throughout the left circumflex distribution.  Right coronary artery (RCA):Small caliber, nondominant vessel without obstructive disease.  Left ventriculography: Left ventricular systolic function is normal, LVEF is estimated at 55-65%, there is no significant mitral regurgitation   Abdominal aortic angiography: The renal arteries are patent bilaterally. There is an infrarenal abdominal aortic aneurysm extending into the bilateral common iliac arteries.  Contrast: 115 cc Omnipaque  Estimated Blood Loss: Minimal  Final Conclusions:  1. Widely patent coronary arteries, left dominant 2. Normal LV function 3. Infrarenal abdominal aortic aneurysm   Recommendations:Referral to Dr. Trula Slade for further evaluation of his infrarenal  AAA. Appears to have noncardiac chest pain. If he requires surgery, he can proceed without further cardiac testing.  ASSESSMENT AND PLAN: 1.  HTN: BP controlled on current Rx. Medications reviewed and no changes recommended today.  2. Hyperlipidemia: Treated with simvastatin. Lipids are followed by his primary care physician.  3. Abdominal aortic aneurysm status post EVAR. Followed by vascular surgery. Abdominal aortic duplex from December 2016 reviewed. He has annual surveillance.  4. Tobacco abuse: Cessation counseling done. He tells a heavy smoking about 2 cigarettes per day and he is encouraged to quit completely.  Current medicines are reviewed with the patient today.  The patient does not have concerns regarding medicines.  Labs/ tests ordered today include:  No orders of the defined types were placed in this encounter.  Disposition:   FU one year  Signed, [name], MD  07/17/2016 5:46 AM    [facility] [address], [place], [place]  [address] Phone: [phone]; Fax: [phone]     [name], MD at 07/16/2016 4:30 PM

## 2016-07-16 NOTE — Progress Notes (Signed)
Cardiology Office Note Date:  07/17/2016   ID:  John Choi, DOB Jul 28, 1951, MRN [id number]  PCP:  [name]., MD  Cardiologist:  [name], MD    Chief Complaint  Patient presents with  . Hypertension   History of Present Illness: John Choi is a 65 y.o. male who presents for follow-up evaluation. The patient has a history of endovascular abdominal aortic aneurysm repair in 2015. He underwent a preoperative cardiac catheterization after an abnormal stress test and was found to have normal coronary arteries. He's also followed for hypertension and hyperlipidemia. He continues to smoke about 2 cigarettes per day. Clinically he is doing well. Today, he denies symptoms of palpitations, chest pain, shortness of breath, orthopnea, PND, lower extremity edema, dizziness, or syncope.  Past Medical History:  Diagnosis Date  . Asthma   . CHF (congestive heart failure) ([place])   . Head trauma   . Hypertension   . Kidney stones   . Peripheral vascular disease [facility])     Past Surgical History:  Procedure Laterality Date  . ABDOMINAL AORTIC ENDOVASCULAR STENT GRAFT N/A 07/20/2014   Procedure: ABDOMINAL AORTIC ENDOVASCULAR STENT GRAFT;  Surgeon: Serafina Mitchell, MD;  Location: [place];  Service: Vascular;  Laterality: N/A;  . BACK SURGERY    . BREAST SURGERY    . CARDIAC CATHETERIZATION  05/2014  . COLONOSCOPY W/ POLYPECTOMY  1997  . KIDNEY STONE SURGERY  1980's and 1990's   extraction  . LEFT HEART CATHETERIZATION WITH CORONARY ANGIOGRAM N/A 06/11/2014   Procedure: LEFT HEART CATHETERIZATION WITH CORONARY ANGIOGRAM;  Surgeon: Blane Ohara, MD;  Location: [facility] CATH LAB;  Service: Cardiovascular;  Laterality: N/A;    Current Outpatient Prescriptions  Medication Sig Dispense Refill  . albuterol (PROVENTIL HFA;VENTOLIN HFA) 108 (90 BASE) MCG/ACT inhaler Inhale 2 puffs into the lungs every 6 (six) hours as needed for wheezing or shortness of breath.    Marland Kitchen amLODipine  (NORVASC) 10 MG tablet Take 1 tablet (10 mg total) by mouth at bedtime. 90 tablet 3  . aspirin EC 81 MG tablet Take 81 mg by mouth at bedtime.     . metoprolol succinate (TOPROL-XL) 25 MG 24 hr tablet Take 0.5 tablets (12.5 mg total) by mouth daily. *Please call and schedule a one year follow up appointment* 45 tablet 0  . simvastatin (ZOCOR) 20 MG tablet Take 1 tablet (20 mg total) by mouth at bedtime. 90 tablet 3   Current Facility-Administered Medications  Medication Dose Route Frequency Provider Last Rate Last Dose  . diazepam (VALIUM) tablet 5 mg  5 mg Oral Once [name], MD        Allergies:   Review of patient's allergies indicates no known allergies.   Social History:  The patient  reports that he has been smoking Cigarettes.  He has a 10.00 pack-year smoking history. He has never used smokeless tobacco. He reports that he does not drink alcohol or use drugs.   Family History:  The patient's  family history includes Cancer in his brother and sister; Diabetes in his brother and sister; Heart attack in his father and mother; Heart disease in his father and sister; Heart disease (age of onset: 78) in his mother; Stroke in his brother and father.    ROS:  Please see the history of present illness.  Otherwise, review of systems is positive for easy bruising.  All other systems are reviewed and negative.   PHYSICAL EXAM: VS:  BP 130/80  Pulse 88   Ht 6' (1.829 m)   Wt 198 lb 3.2 oz (89.9 kg)   BMI 26.88 kg/m  , BMI Body mass index is 26.88 kg/m. GEN: Well nourished, well developed, in no acute distress  HEENT: normal  Neck: no JVD, no masses. No carotid bruits Cardiac: RRR without murmur or gallop                Respiratory:  clear to auscultation bilaterally, normal work of breathing GI: soft, nontender, nondistended, + BS MS: no deformity or atrophy  Ext: no pretibial edema, pedal pulses 2+= bilaterally Skin: warm and dry, no rash Neuro:  Strength and sensation are  intact Psych: euthymic mood, full affect  EKG:  EKG is not ordered today.  Recent Labs: No results found for requested labs within last 8760 hours.   Lipid Panel     Component Value Date/Time   CHOL (H) 11/23/2009 0155    207        ATP III CLASSIFICATION:  <200     mg/dL   Desirable  200-239  mg/dL   Borderline High  >=240    mg/dL   High          TRIG 320 (H) 11/23/2009 0155   HDL 29 (L) 11/23/2009 0155   CHOLHDL 7.1 11/23/2009 0155   VLDL 64 (H) 11/23/2009 0155   LDLCALC (H) 11/23/2009 0155    114        Total Cholesterol/HDL:CHD Risk Coronary Heart Disease Risk Table                     Men   Women  1/2 Average Risk   3.4   3.3  Average Risk       5.0   4.4  2 X Average Risk   9.6   7.1  3 X Average Risk  23.4   11.0        Use the calculated Patient Ratio above and the CHD Risk Table to determine the patient's CHD Risk.        ATP III CLASSIFICATION (LDL):  <100     mg/dL   Optimal  100-129  mg/dL   Near or Above                    Optimal  130-159  mg/dL   Borderline  160-189  mg/dL   High  >190     mg/dL   Very High      Wt Readings from Last 3 Encounters:  07/16/16 198 lb 3.2 oz (89.9 kg)  09/09/15 191 lb (86.6 kg)  07/02/15 189 lb (85.7 kg)     Cardiac Studies Reviewed: Cardiac Cath 06/11/2014: Procedure: Left Heart Cath, Selective Coronary Angiography, LV angiography, abdominal aortic angiography  Indication: CCS class III anginal symptoms, and known vascular disease, multiple cardiac risk factors.                                   Procedural Details: The right wrist was prepped, draped, and anesthetized with 1% lidocaine. Using the modified Seldinger technique, a 5/6 French Slender sheath was introduced into the right radial artery. 3 mg of verapamil was administered through the sheath, weight-based unfractionated heparin was administered intravenously. Standard Judkins catheters were used for selective coronary angiography and left  ventriculography. The pigtail catheter was advanced into the descending thoracic aorta after ventriculography and  then into the suprarenal abdominal aorta for abdominal aortic angiography. Catheter exchanges were performed over an exchange length guidewire. There were no immediate procedural complications. A TR band was used for radial hemostasis at the completion of the procedure.  The patient was transferred to the post catheterization recovery area for further monitoring.  Procedural Findings: Hemodynamics: AO 100/64 LV 101/8  Coronary angiography: Coronary dominance: right  Left mainstem: The left main is widely patent with no obstructive disease. The vessel divides into the LAD and left circumflex the  Left anterior descending (LAD): The LAD courses to the apex. The first diagonal is large in caliber and widely patent. There is no obstructive disease.  Left circumflex (LCx): The left circumflex is dominant. The vessel supplies a large first obtuse marginal without stenosis. The AV groove circumflex extends down and supplies a PLA and PDA branch. There are no stenoses throughout the left circumflex distribution.  Right coronary artery (RCA): Small caliber, nondominant vessel without obstructive disease.  Left ventriculography: Left ventricular systolic function is normal, LVEF is estimated at 55-65%, there is no significant mitral regurgitation   Abdominal aortic angiography: The renal arteries are patent bilaterally. There is an infrarenal abdominal aortic aneurysm extending into the bilateral common iliac arteries.  Contrast: 115 cc Omnipaque  Estimated Blood Loss: Minimal  Final Conclusions:   1. Widely patent coronary arteries, left dominant 2. Normal LV function 3. Infrarenal abdominal aortic aneurysm   Recommendations: Referral to Dr. Trula Slade for further evaluation of his infrarenal AAA. Appears to have noncardiac chest pain. If he requires surgery, he can proceed  without further cardiac testing.  ASSESSMENT AND PLAN: 1.  HTN: BP controlled on current Rx. Medications reviewed and no changes recommended today.  2. Hyperlipidemia: Treated with simvastatin. Lipids are followed by his primary care physician.  3. Abdominal aortic aneurysm status post EVAR. Followed by vascular surgery. Abdominal aortic duplex from December 2016 reviewed. He has annual surveillance.  4. Tobacco abuse: Cessation counseling done. He tells a heavy smoking about 2 cigarettes per day and he is encouraged to quit completely.  Current medicines are reviewed with the patient today.  The patient does not have concerns regarding medicines.  Labs/ tests ordered today include:  No orders of the defined types were placed in this encounter.  Disposition:   FU one year  Signed, [name], MD  07/17/2016 5:46 AM    [facility] [place], [place], [place]  [address] Phone: [phone]; Fax: [phone]

## 2016-09-10 ENCOUNTER — Other Ambulatory Visit: Payer: Self-pay | Admitting: Cardiovascular Disease

## 2016-09-10 DIAGNOSIS — E785 Hyperlipidemia, unspecified: Secondary | ICD-10-CM

## 2016-09-11 ENCOUNTER — Encounter: Payer: Self-pay | Admitting: Family

## 2016-09-24 ENCOUNTER — Other Ambulatory Visit: Payer: Self-pay | Admitting: *Deleted

## 2016-09-24 DIAGNOSIS — Z95828 Presence of other vascular implants and grafts: Secondary | ICD-10-CM

## 2016-09-24 DIAGNOSIS — I714 Abdominal aortic aneurysm, without rupture, unspecified: Secondary | ICD-10-CM

## 2016-09-24 DIAGNOSIS — I739 Peripheral vascular disease, unspecified: Secondary | ICD-10-CM

## 2016-09-28 ENCOUNTER — Ambulatory Visit (INDEPENDENT_AMBULATORY_CARE_PROVIDER_SITE_OTHER): Payer: Medicare Other | Admitting: Family

## 2016-09-28 ENCOUNTER — Encounter: Payer: Self-pay | Admitting: Family

## 2016-09-28 ENCOUNTER — Ambulatory Visit (HOSPITAL_COMMUNITY)
Admission: RE | Admit: 2016-09-28 | Discharge: 2016-09-28 | Disposition: A | Discharge status: Home / Self Care | Payer: Medicare Other | Source: Ambulatory Visit | Attending: Family | Admitting: Family

## 2016-09-28 VITALS — BP 128/87 | HR 89 | Temp 97.4°F | Resp 18 | Ht 72.0 in | Wt 192.0 lb

## 2016-09-28 DIAGNOSIS — I714 Abdominal aortic aneurysm, without rupture, unspecified: Secondary | ICD-10-CM

## 2016-09-28 DIAGNOSIS — F17200 Nicotine dependence, unspecified, uncomplicated: Secondary | ICD-10-CM | POA: Diagnosis not present

## 2016-09-28 DIAGNOSIS — Z95828 Presence of other vascular implants and grafts: Secondary | ICD-10-CM | POA: Diagnosis not present

## 2016-09-28 NOTE — Progress Notes (Signed)
[facility] [place]  CC: Follow up s/p EVAR  History of Present Illness  John Choi is a 66 y.o. (1950/10/25) male patient of Dr. Trula Slade who is s/p endovascular repair of an abdominal aortic aneurysm on 07/20/2014. His postoperative course was uncomplicated and he was discharged to home on postoperative day 1. He returns today for routine follow up.  08/27/14 CT angiogram showed a decrease in maximum diameter of the aneurysm from 5.7 down to 5.4. There was no evidence of endoleak.  He had a ventral hernia repaired in August, 2016, states doing well after this.   Pt states he stays physically active.   The pt denies any back or abdominal pain. Pt denies claudication symptoms with walking, denies non healing wounds. He denies any history of stroke or TIA.   He takes a daily 81 mg ASA, statin, and beta blocker.   Pt Diabetic: No Pt smoker: smoker (2 cigarettes/day, decreased from 2 ppd, started smoking at age 39 yrs), trying for 40 years to quit   Past Medical History:  Diagnosis Date  . Asthma   . CHF (congestive heart failure) ([place])   . Head trauma   . Hypertension   . Kidney stones   . Peripheral vascular disease [facility])    Past Surgical History:  Procedure Laterality Date  . ABDOMINAL AORTIC ENDOVASCULAR STENT GRAFT N/A 07/20/2014   Procedure: ABDOMINAL AORTIC ENDOVASCULAR STENT GRAFT;  Surgeon: Serafina Mitchell, MD;  Location: [place];  Service: Vascular;  Laterality: N/A;  . BACK SURGERY    . BREAST SURGERY    . CARDIAC CATHETERIZATION  05/2014  . COLONOSCOPY W/ POLYPECTOMY  1997  . KIDNEY STONE SURGERY  1980's and 1990's   extraction  . LEFT HEART CATHETERIZATION WITH CORONARY ANGIOGRAM N/A 06/11/2014   Procedure: LEFT HEART CATHETERIZATION WITH CORONARY ANGIOGRAM;  Surgeon: Blane Ohara, MD;  Location: [facility] C[facility] LAB;  Service: Cardiovascular;  Laterality: N/A;   Social History Social History  Substance Use Topics  . Smoking status:  Current Every Day Smoker    Packs/day: 0.25    Years: 40.00    Types: Cigarettes  . Smokeless tobacco: Never Used     Comment: Per patient, smokes two cigarettes daily.   . Alcohol use No   Family History Family History  Problem Relation Age of Onset  . Heart disease Mother 71    Before age 9  . Heart attack Mother   . Heart attack Father   . Stroke Father   . Heart disease Father     Before age 75  . Cancer Sister     Brain  . Heart disease Sister     Before age 38  . Diabetes Sister   . Cancer Brother     Throat, Liver, Pancreatic  . Stroke Brother   . Diabetes Brother    Current Outpatient Prescriptions on File Prior to Visit  Medication Sig Dispense Refill  . albuterol (PROVENTIL HFA;VENTOLIN HFA) 108 (90 BASE) MCG/ACT inhaler Inhale 2 puffs into the lungs every 6 (six) hours as needed for wheezing or shortness of breath.    Marland Kitchen amLODipine (NORVASC) 10 MG tablet Take 1 tablet (10 mg total) by mouth at bedtime. 90 tablet 3  . aspirin EC 81 MG tablet Take 81 mg by mouth at bedtime.     . metoprolol succinate (TOPROL-XL) 25 MG 24 hr tablet Take 0.5 tablets (12.5 mg total) by mouth daily. *Please call and schedule a one  year follow up appointment* 45 tablet 0  . simvastatin (ZOCOR) 20 MG tablet TAKE 1 TABLET(20 MG) BY MOUTH AT BEDTIME 90 tablet 3   Current Facility-Administered Medications on File Prior to Visit  Medication Dose Route Frequency Provider Last Rate Last Dose  . diazepam (VALIUM) tablet 5 mg  5 mg Oral Once [name], MD       No Known Allergies   ROS: See HPI for pertinent positives and negatives.  Physical Examination  Vitals:   09/28/16 0919  BP: 128/87  Pulse: 89  Resp: 18  Temp: 97.4 F (36.3 C)  SpO2: 95%  Weight: 192 lb (87.1 kg)  Height: 6' (1.829 m)   Body mass index is 26.04 kg/m.  General: A&O x 3, WD.  Pulmonary: Sym exp, respirations are non labored, fair air movt, CTAB, no rales, rhonchi, or wheezing.   Cardiac: RRR,  Nl S1, S2, no murmur appreciated  Vascular: Vessel Right Left  Radial 2+Palpable 2+Palpable  Carotid palpable without bruit Palpable without bruit  Aorta Not palpable N/A  Femoral 2+Palpable 2+Palpable  Popliteal Not palpable Not palpable  PT 2+Palpable 2+Palpable  DP 2+Palpable 2+Palpable   Gastrointestinal: soft, NTND, -G/R, - HSM, large (about 9 cm x 12 cm) reducible ventral hernia, - CVAT B.  Musculoskeletal: M/S 5/5 throughout, Extremities without ischemic changes.  Neurologic: Pain and light touch intact in extremities, Motor exam as listed above.    Non-Invasive Vascular Imaging  10-23- 2015 Carotid Duplex: <40% bilateral ICA stenosis. Bilateral vertebral artery flow is antegrade.  Bilateral subclavian artery waveforms are normal.  No prior carotid duplex performed at this facility.    EVAR Duplex (Date: 09-28-16)  AAA sac size: 3.86 cm; right CIA: 1.56 cm; left CIA: 1.59 cm  no endoleak detected (09/09/2015) AAA sac size: 4.21 cm    Medical Decision Making  John Choi is a 66 y.o. male who presents s/p EVAR (Date: 07/20/2014).  Pt is asymptomatic with a decrease in sac size from 4.21 cm on 09-09-15 to 3.86 cm today, based on limited visualization due to overlying bowel gas. However, today the bilateral common iliac arteries were able to be measured; a year ago the bilateral CIA's were not visualized due to overlying bowel gas.   The patient was counseled re smoking cessation and given several free resources re smoking cessation.   I discussed with the patient the importance of surveillance of the endograft.  The next endograft duplex will be scheduled for 12 months.  The patient will follow up with [place] in 12 months with these studies.        Follow up carotid duplex will be due in 2020.   I emphasized the importance of maximal medical management including strict control of blood pressure, blood glucose, and lipid levels,  antiplatelet agents, obtaining regular exercise, and cessation of smoking.   Thank you for allowing [place] to participate in this patient's care.  Clemon Chambers, RN, MSN, FNP-C [facility] [place] Office: [phone]  Clinic Physician: Trula Slade  09/28/2016, 9:23 AM

## 2016-09-28 NOTE — Patient Instructions (Addendum)
Steps to Quit Smoking Smoking tobacco can be bad for your health. It can also affect almost every organ in your body. Smoking puts you and people around you at risk for many serious long-lasting (chronic) diseases. Quitting smoking is hard, but it is one of the best things that you can do for your health. It is never too late to quit. What are the benefits of quitting smoking? When you quit smoking, you lower your risk for getting serious diseases and conditions. They can include:  Lung cancer or lung disease.  Heart disease.  Stroke.  Heart attack.  Not being able to have children (infertility).  Weak bones (osteoporosis) and broken bones (fractures). If you have coughing, wheezing, and shortness of breath, those symptoms may get better when you quit. You may also get sick less often. If you are pregnant, quitting smoking can help to lower your chances of having a baby of low birth weight. What can I do to help me quit smoking? Talk with your doctor about what can help you quit smoking. Some things you can do (strategies) include:  Quitting smoking totally, instead of slowly cutting back how much you smoke over a period of time.  Going to in-person counseling. You are more likely to quit if you go to many counseling sessions.  Using resources and support systems, such as:  Online chats with a counselor.  Phone quitlines.  Printed self-help materials.  Support groups or group counseling.  Text messaging programs.  Mobile phone apps or applications.  Taking medicines. Some of these medicines may have nicotine in them. If you are pregnant or breastfeeding, do not take any medicines to quit smoking unless your doctor says it is okay. Talk with your doctor about counseling or other things that can help you. Talk with your doctor about using more than one strategy at the same time, such as taking medicines while you are also going to in-person counseling. This can help make quitting  easier. What things can I do to make it easier to quit? Quitting smoking might feel very hard at first, but there is a lot that you can do to make it easier. Take these steps:  Talk to your family and friends. Ask them to support and encourage you.  Call phone quitlines, reach out to support groups, or work with a counselor.  Ask people who smoke to not smoke around you.  Avoid places that make you want (trigger) to smoke, such as:  Bars.  Parties.  Smoke-break areas at work.  Spend time with people who do not smoke.  Lower the stress in your life. Stress can make you want to smoke. Try these things to help your stress:  Getting regular exercise.  Deep-breathing exercises.  Yoga.  Meditating.  Doing a body scan. To do this, close your eyes, focus on one area of your body at a time from head to toe, and notice which parts of your body are tense. Try to relax the muscles in those areas.  Download or buy apps on your mobile phone or tablet that can help you stick to your quit plan. There are many free apps, such as QuitGuide from the CDC (Centers for Disease Control and Prevention). You can find more support from smokefree.gov and other websites. This information is not intended to replace advice given to you by your health care provider. Make sure you discuss any questions you have with your health care provider. Document Released: 07/04/2009 Document Revised: 05/05/2016 Document   Reviewed: 01/22/2015 Elsevier Interactive Patient Education  2017 [place].     Before your next abdominal ultrasound:  Take two Extra-Strength Gas-X capsules at bedtime the night before the test. Take another two Extra-Strength Gas-X capsules 3 hours before the test.

## 2016-09-29 NOTE — Addendum Note (Signed)
Addended by: Lianne Cure A on: 09/29/2016 02:29 PM   Modules accepted: Orders

## 2016-10-06 ENCOUNTER — Other Ambulatory Visit: Payer: Self-pay

## 2016-10-09 ENCOUNTER — Other Ambulatory Visit: Payer: Self-pay | Admitting: Cardiovascular Disease

## 2016-10-09 DIAGNOSIS — E785 Hyperlipidemia, unspecified: Secondary | ICD-10-CM

## 2017-03-04 ENCOUNTER — Encounter (HOSPITAL_COMMUNITY): Payer: Self-pay | Admitting: Emergency Medicine

## 2017-03-04 ENCOUNTER — Emergency Department (HOSPITAL_COMMUNITY): Payer: Medicare Other

## 2017-03-04 ENCOUNTER — Inpatient Hospital Stay (HOSPITAL_COMMUNITY)
Admission: EM | Admit: 2017-03-04 | Discharge: 2017-03-09 | DRG: 536 | Disposition: A | Discharge status: Home Health | Payer: Medicare Other | Attending: General Surgery | Admitting: General Surgery

## 2017-03-04 DIAGNOSIS — R0902 Hypoxemia: Secondary | ICD-10-CM | POA: Diagnosis not present

## 2017-03-04 DIAGNOSIS — F17210 Nicotine dependence, cigarettes, uncomplicated: Secondary | ICD-10-CM | POA: Diagnosis not present

## 2017-03-04 DIAGNOSIS — I130 Hypertensive heart and chronic kidney disease with heart failure and stage 1 through stage 4 chronic kidney disease, or unspecified chronic kidney disease: Secondary | ICD-10-CM | POA: Diagnosis present

## 2017-03-04 DIAGNOSIS — J9811 Atelectasis: Secondary | ICD-10-CM | POA: Diagnosis not present

## 2017-03-04 DIAGNOSIS — Z7982 Long term (current) use of aspirin: Secondary | ICD-10-CM

## 2017-03-04 DIAGNOSIS — S300XXA Contusion of lower back and pelvis, initial encounter: Secondary | ICD-10-CM | POA: Diagnosis not present

## 2017-03-04 DIAGNOSIS — I739 Peripheral vascular disease, unspecified: Secondary | ICD-10-CM | POA: Diagnosis present

## 2017-03-04 DIAGNOSIS — M25552 Pain in left hip: Secondary | ICD-10-CM

## 2017-03-04 DIAGNOSIS — J45909 Unspecified asthma, uncomplicated: Secondary | ICD-10-CM

## 2017-03-04 DIAGNOSIS — S3993XA Unspecified injury of pelvis, initial encounter: Secondary | ICD-10-CM | POA: Diagnosis not present

## 2017-03-04 DIAGNOSIS — E785 Hyperlipidemia, unspecified: Secondary | ICD-10-CM | POA: Diagnosis present

## 2017-03-04 DIAGNOSIS — S329XXA Fracture of unspecified parts of lumbosacral spine and pelvis, initial encounter for closed fracture: Secondary | ICD-10-CM | POA: Diagnosis present

## 2017-03-04 DIAGNOSIS — S3022XA Contusion of scrotum and testes, initial encounter: Secondary | ICD-10-CM | POA: Diagnosis present

## 2017-03-04 DIAGNOSIS — N183 Chronic kidney disease, stage 3 unspecified: Secondary | ICD-10-CM

## 2017-03-04 DIAGNOSIS — W19XXXA Unspecified fall, initial encounter: Secondary | ICD-10-CM

## 2017-03-04 DIAGNOSIS — M545 Low back pain: Secondary | ICD-10-CM | POA: Diagnosis not present

## 2017-03-04 DIAGNOSIS — Z8249 Family history of ischemic heart disease and other diseases of the circulatory system: Secondary | ICD-10-CM

## 2017-03-04 DIAGNOSIS — I5032 Chronic diastolic (congestive) heart failure: Secondary | ICD-10-CM | POA: Diagnosis not present

## 2017-03-04 DIAGNOSIS — I10 Essential (primary) hypertension: Secondary | ICD-10-CM

## 2017-03-04 DIAGNOSIS — M5489 Other dorsalgia: Secondary | ICD-10-CM | POA: Diagnosis not present

## 2017-03-04 DIAGNOSIS — S32592A Other specified fracture of left pubis, initial encounter for closed fracture: Secondary | ICD-10-CM | POA: Diagnosis not present

## 2017-03-04 DIAGNOSIS — Z87442 Personal history of urinary calculi: Secondary | ICD-10-CM

## 2017-03-04 DIAGNOSIS — D72829 Elevated white blood cell count, unspecified: Secondary | ICD-10-CM

## 2017-03-04 DIAGNOSIS — Z720 Tobacco use: Secondary | ICD-10-CM

## 2017-03-04 DIAGNOSIS — Z95828 Presence of other vascular implants and grafts: Secondary | ICD-10-CM

## 2017-03-04 DIAGNOSIS — Z79899 Other long term (current) drug therapy: Secondary | ICD-10-CM

## 2017-03-04 DIAGNOSIS — W309XXA Contact with unspecified agricultural machinery, initial encounter: Secondary | ICD-10-CM

## 2017-03-04 DIAGNOSIS — R102 Pelvic and perineal pain: Secondary | ICD-10-CM | POA: Diagnosis not present

## 2017-03-04 DIAGNOSIS — S3289XD Fracture of other parts of pelvis, subsequent encounter for fracture with routine healing: Secondary | ICD-10-CM

## 2017-03-04 DIAGNOSIS — S3992XA Unspecified injury of lower back, initial encounter: Secondary | ICD-10-CM | POA: Diagnosis not present

## 2017-03-04 HISTORY — DX: Pure hypercholesterolemia, unspecified: E78.00

## 2017-03-04 HISTORY — DX: Personal history of urinary calculi: Z87.442

## 2017-03-04 HISTORY — DX: Other specified fracture of unspecified pubis, initial encounter for closed fracture: S32.599A

## 2017-03-04 LAB — BASIC METABOLIC PANEL
Anion gap: 8 (ref 5–15)
BUN: 21 mg/dL — ABNORMAL HIGH (ref 6–20)
CO2: 22 mmol/L (ref 22–32)
CREATININE: 2.06 mg/dL — AB (ref 0.61–1.24)
Calcium: 9.5 mg/dL (ref 8.9–10.3)
Chloride: 108 mmol/L (ref 101–111)
GFR, EST AFRICAN AMERICAN: 37 mL/min — AB (ref >60)
GFR, EST NON AFRICAN AMERICAN: 32 mL/min — AB (ref >60)
Glucose, Bld: 156 mg/dL — ABNORMAL HIGH (ref 65–99)
Potassium: 4.2 mmol/L (ref 3.5–5.1)
Sodium: 138 mmol/L (ref 135–145)

## 2017-03-04 MED ORDER — FENTANYL CITRATE (PF) 100 MCG/2ML IJ SOLN
100.0000 ug | Freq: Once | INTRAMUSCULAR | Status: AC
  Administered 2017-03-04: 100 ug via INTRAVENOUS
  Filled 2017-03-04: qty 2

## 2017-03-04 MED ORDER — SODIUM CHLORIDE 0.9 % IV BOLUS (SEPSIS)
1000.0000 mL | Freq: Once | INTRAVENOUS | Status: AC
  Administered 2017-03-04: 1000 mL via INTRAVENOUS
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
## 2017-03-05 ENCOUNTER — Emergency Department (HOSPITAL_COMMUNITY): Payer: Medicare Other

## 2017-03-05 ENCOUNTER — Encounter (HOSPITAL_COMMUNITY): Payer: Self-pay | Admitting: General Practice

## 2017-03-05 DIAGNOSIS — I10 Essential (primary) hypertension: Secondary | ICD-10-CM | POA: Diagnosis not present

## 2017-03-05 DIAGNOSIS — N189 Chronic kidney disease, unspecified: Secondary | ICD-10-CM | POA: Diagnosis not present

## 2017-03-05 DIAGNOSIS — S300XXA Contusion of lower back and pelvis, initial encounter: Secondary | ICD-10-CM | POA: Diagnosis not present

## 2017-03-05 DIAGNOSIS — S32502A Unspecified fracture of left pubis, initial encounter for closed fracture: Secondary | ICD-10-CM | POA: Diagnosis not present

## 2017-03-05 DIAGNOSIS — E785 Hyperlipidemia, unspecified: Secondary | ICD-10-CM | POA: Diagnosis present

## 2017-03-05 DIAGNOSIS — S3992XA Unspecified injury of lower back, initial encounter: Secondary | ICD-10-CM | POA: Diagnosis not present

## 2017-03-05 DIAGNOSIS — J9811 Atelectasis: Secondary | ICD-10-CM | POA: Diagnosis not present

## 2017-03-05 DIAGNOSIS — F17210 Nicotine dependence, cigarettes, uncomplicated: Secondary | ICD-10-CM | POA: Diagnosis present

## 2017-03-05 DIAGNOSIS — Z7982 Long term (current) use of aspirin: Secondary | ICD-10-CM | POA: Diagnosis not present

## 2017-03-05 DIAGNOSIS — J45909 Unspecified asthma, uncomplicated: Secondary | ICD-10-CM | POA: Diagnosis not present

## 2017-03-05 DIAGNOSIS — I130 Hypertensive heart and chronic kidney disease with heart failure and stage 1 through stage 4 chronic kidney disease, or unspecified chronic kidney disease: Secondary | ICD-10-CM | POA: Diagnosis present

## 2017-03-05 DIAGNOSIS — M545 Low back pain: Secondary | ICD-10-CM | POA: Diagnosis not present

## 2017-03-05 DIAGNOSIS — W19XXXD Unspecified fall, subsequent encounter: Secondary | ICD-10-CM | POA: Diagnosis not present

## 2017-03-05 DIAGNOSIS — S3993XA Unspecified injury of pelvis, initial encounter: Secondary | ICD-10-CM | POA: Diagnosis not present

## 2017-03-05 DIAGNOSIS — S329XXA Fracture of unspecified parts of lumbosacral spine and pelvis, initial encounter for closed fracture: Secondary | ICD-10-CM | POA: Diagnosis present

## 2017-03-05 DIAGNOSIS — M25552 Pain in left hip: Secondary | ICD-10-CM | POA: Diagnosis not present

## 2017-03-05 DIAGNOSIS — S32501A Unspecified fracture of right pubis, initial encounter for closed fracture: Secondary | ICD-10-CM | POA: Diagnosis not present

## 2017-03-05 DIAGNOSIS — Z79899 Other long term (current) drug therapy: Secondary | ICD-10-CM | POA: Diagnosis not present

## 2017-03-05 DIAGNOSIS — Z720 Tobacco use: Secondary | ICD-10-CM | POA: Diagnosis not present

## 2017-03-05 DIAGNOSIS — S32592A Other specified fracture of left pubis, initial encounter for closed fracture: Secondary | ICD-10-CM | POA: Diagnosis present

## 2017-03-05 DIAGNOSIS — R102 Pelvic and perineal pain: Secondary | ICD-10-CM | POA: Diagnosis not present

## 2017-03-05 DIAGNOSIS — N183 Chronic kidney disease, stage 3 (moderate): Secondary | ICD-10-CM | POA: Diagnosis not present

## 2017-03-05 DIAGNOSIS — Z95828 Presence of other vascular implants and grafts: Secondary | ICD-10-CM | POA: Diagnosis not present

## 2017-03-05 DIAGNOSIS — S3022XA Contusion of scrotum and testes, initial encounter: Secondary | ICD-10-CM | POA: Diagnosis present

## 2017-03-05 DIAGNOSIS — I739 Peripheral vascular disease, unspecified: Secondary | ICD-10-CM | POA: Diagnosis present

## 2017-03-05 DIAGNOSIS — D72829 Elevated white blood cell count, unspecified: Secondary | ICD-10-CM | POA: Diagnosis not present

## 2017-03-05 DIAGNOSIS — Z8249 Family history of ischemic heart disease and other diseases of the circulatory system: Secondary | ICD-10-CM | POA: Diagnosis not present

## 2017-03-05 DIAGNOSIS — Z87442 Personal history of urinary calculi: Secondary | ICD-10-CM | POA: Diagnosis not present

## 2017-03-05 DIAGNOSIS — S3289XD Fracture of other parts of pelvis, subsequent encounter for fracture with routine healing: Secondary | ICD-10-CM | POA: Diagnosis not present

## 2017-03-05 DIAGNOSIS — I5032 Chronic diastolic (congestive) heart failure: Secondary | ICD-10-CM | POA: Diagnosis not present

## 2017-03-05 LAB — CBC WITH DIFFERENTIAL/PLATELET
BASOS PCT: 0 %
Basophils Absolute: 0.1 10*3/uL (ref 0.0–0.1)
Eosinophils Absolute: 0.2 10*3/uL (ref 0.0–0.7)
Eosinophils Relative: 1 %
HEMATOCRIT: 43.8 % (ref 39.0–52.0)
HEMOGLOBIN: 14.8 g/dL (ref 13.0–17.0)
LYMPHS ABS: 1.8 10*3/uL (ref 0.7–4.0)
Lymphocytes Relative: 10 %
MCH: 32.2 pg (ref 26.0–34.0)
MCHC: 33.8 g/dL (ref 30.0–36.0)
MCV: 95.4 fL (ref 78.0–100.0)
MONOS PCT: 5 %
Monocytes Absolute: 0.8 10*3/uL (ref 0.1–1.0)
NEUTROS ABS: 14.5 10*3/uL — AB (ref 1.7–7.7)
NEUTROS PCT: 84 %
Platelets: 262 10*3/uL (ref 150–400)
RBC: 4.59 MIL/uL (ref 4.22–5.81)
RDW: 14.5 % (ref 11.5–15.5)
WBC: 17.4 10*3/uL — AB (ref 4.0–10.5)

## 2017-03-05 LAB — HEPATIC FUNCTION PANEL
ALBUMIN: 3.2 g/dL — AB (ref 3.5–5.0)
ALK PHOS: 93 U/L (ref 38–126)
ALT: 18 U/L (ref 17–63)
AST: 25 U/L (ref 15–41)
BILIRUBIN TOTAL: 0.4 mg/dL (ref 0.3–1.2)
Bilirubin, Direct: <0.1 mg/dL — ABNORMAL LOW (ref 0.1–0.5)
TOTAL PROTEIN: 6.3 g/dL — AB (ref 6.5–8.1)

## 2017-03-05 LAB — LIPASE, BLOOD: LIPASE: 30 U/L (ref 11–51)

## 2017-03-05 MED ORDER — DOCUSATE SODIUM 100 MG PO CAPS
100.0000 mg | ORAL_CAPSULE | Freq: Two times a day (BID) | ORAL | Status: DC
  Administered 2017-03-05 – 2017-03-09 (×8): 100 mg via ORAL
  Filled 2017-03-05 (×10): qty 1

## 2017-03-05 MED ORDER — METHOCARBAMOL 1000 MG/10ML IJ SOLN
1000.0000 mg | Freq: Three times a day (TID) | INTRAVENOUS | Status: DC | PRN
  Filled 2017-03-05: qty 10

## 2017-03-05 MED ORDER — POLYETHYLENE GLYCOL 3350 17 G PO PACK
17.0000 g | PACK | Freq: Every day | ORAL | Status: DC
  Administered 2017-03-05 – 2017-03-09 (×2): 17 g via ORAL
  Filled 2017-03-05 (×5): qty 1

## 2017-03-05 MED ORDER — OXYCODONE HCL 5 MG PO TABS
5.0000 mg | ORAL_TABLET | Freq: Once | ORAL | Status: AC
  Administered 2017-03-05: 5 mg via ORAL
  Filled 2017-03-05: qty 1

## 2017-03-05 MED ORDER — ALBUTEROL SULFATE (2.5 MG/3ML) 0.083% IN NEBU: 3.0000 mL | INHALATION_SOLUTION | Freq: Four times a day (QID) | RESPIRATORY_TRACT | Status: DC | PRN

## 2017-03-05 MED ORDER — ONDANSETRON HCL 4 MG/2ML IJ SOLN
4.0000 mg | Freq: Four times a day (QID) | INTRAMUSCULAR | Status: DC | PRN
  Administered 2017-03-05: 4 mg via INTRAVENOUS
  Filled 2017-03-05: qty 2

## 2017-03-05 MED ORDER — FENTANYL CITRATE (PF) 100 MCG/2ML IJ SOLN
100.0000 ug | Freq: Once | INTRAMUSCULAR | Status: AC
  Administered 2017-03-05: 100 ug via INTRAVENOUS
  Filled 2017-03-05: qty 2

## 2017-03-05 MED ORDER — HYDROCODONE-ACETAMINOPHEN 5-325 MG PO TABS
2.0000 | ORAL_TABLET | ORAL | Status: DC | PRN
  Administered 2017-03-05 – 2017-03-09 (×12): 2 via ORAL
  Filled 2017-03-05 (×12): qty 2

## 2017-03-05 MED ORDER — MORPHINE SULFATE (PF) 4 MG/ML IV SOLN
4.0000 mg | INTRAVENOUS | Status: DC | PRN
  Administered 2017-03-05: 4 mg via INTRAVENOUS
  Filled 2017-03-05: qty 1

## 2017-03-05 MED ORDER — METHOCARBAMOL 750 MG PO TABS
1500.0000 mg | ORAL_TABLET | Freq: Four times a day (QID) | ORAL | Status: DC
  Administered 2017-03-05 – 2017-03-09 (×18): 1500 mg via ORAL
  Filled 2017-03-05 (×20): qty 2

## 2017-03-05 MED ORDER — ONDANSETRON HCL 4 MG PO TABS: 4.0000 mg | ORAL_TABLET | Freq: Four times a day (QID) | ORAL | Status: DC | PRN

## 2017-03-05 MED ORDER — HYDROCODONE-ACETAMINOPHEN 5-325 MG PO TABS
1.0000 | ORAL_TABLET | ORAL | Status: DC | PRN
  Administered 2017-03-05 (×2): 1 via ORAL
  Filled 2017-03-05 (×3): qty 1

## 2017-03-05 MED ORDER — PANTOPRAZOLE SODIUM 40 MG PO TBEC
40.0000 mg | DELAYED_RELEASE_TABLET | Freq: Every day | ORAL | Status: DC
  Administered 2017-03-05 – 2017-03-09 (×5): 40 mg via ORAL
  Filled 2017-03-05 (×5): qty 1

## 2017-03-05 MED ORDER — ACETAMINOPHEN 500 MG PO TABS
1000.0000 mg | ORAL_TABLET | Freq: Once | ORAL | Status: AC
  Administered 2017-03-05: 1000 mg via ORAL
  Filled 2017-03-05: qty 2

## 2017-03-05 MED ORDER — AMLODIPINE BESYLATE 10 MG PO TABS
10.0000 mg | ORAL_TABLET | Freq: Every day | ORAL | Status: DC
  Administered 2017-03-05 – 2017-03-08 (×4): 10 mg via ORAL
  Filled 2017-03-05 (×4): qty 1

## 2017-03-05 MED ORDER — ACETAMINOPHEN 325 MG PO TABS: 650.0000 mg | ORAL_TABLET | ORAL | Status: DC | PRN

## 2017-03-05 MED ORDER — SIMVASTATIN 20 MG PO TABS
20.0000 mg | ORAL_TABLET | Freq: Every day | ORAL | Status: DC
  Administered 2017-03-05 – 2017-03-08 (×4): 20 mg via ORAL
  Filled 2017-03-05 (×5): qty 1

## 2017-03-05 MED ORDER — SODIUM CHLORIDE 0.9 % IV BOLUS (SEPSIS)
1000.0000 mL | Freq: Once | INTRAVENOUS | Status: AC
  Administered 2017-03-05: 1000 mL via INTRAVENOUS

## 2017-03-05 MED ORDER — PANTOPRAZOLE SODIUM 40 MG IV SOLR: 40.0000 mg | Freq: Every day | INTRAVENOUS | Status: DC

## 2017-03-05 MED ORDER — METOPROLOL SUCCINATE 12.5 MG HALF TABLET
12.5000 mg | ORAL_TABLET | Freq: Every day | ORAL | Status: DC
  Administered 2017-03-05 – 2017-03-08 (×4): 12.5 mg via ORAL
  Filled 2017-03-05 (×4): qty 1

## 2017-03-05 MED ORDER — SODIUM CHLORIDE 0.9 % IV SOLN
INTRAVENOUS | Status: DC
  Administered 2017-03-05 – 2017-03-07 (×4): via INTRAVENOUS
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
## 2017-03-05 NOTE — ED Notes (Signed)
Patient transported to CT scan . 
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
## 2017-03-05 NOTE — ED Notes (Signed)
Dr. Dennis Bast ( surgeon ) explained plan of care to pt. and family.
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
## 2017-03-05 NOTE — ED Notes (Signed)
Dr. Billy Fischer explained tests results and admission plan to pt. and family .

## 2017-03-06 LAB — CBC
HCT: 41 % (ref 39.0–52.0)
HEMOGLOBIN: 13.3 g/dL (ref 13.0–17.0)
MCH: 31.6 pg (ref 26.0–34.0)
MCHC: 32.4 g/dL (ref 30.0–36.0)
MCV: 97.4 fL (ref 78.0–100.0)
Platelets: 224 10*3/uL (ref 150–400)
RBC: 4.21 MIL/uL — AB (ref 4.22–5.81)
RDW: 14.7 % (ref 11.5–15.5)
WBC: 10.9 10*3/uL — ABNORMAL HIGH (ref 4.0–10.5)

## 2017-03-06 LAB — BASIC METABOLIC PANEL
Anion gap: 8 (ref 5–15)
BUN: 18 mg/dL (ref 6–20)
CHLORIDE: 108 mmol/L (ref 101–111)
CO2: 21 mmol/L — AB (ref 22–32)
Calcium: 8.2 mg/dL — ABNORMAL LOW (ref 8.9–10.3)
Creatinine, Ser: 1.42 mg/dL — ABNORMAL HIGH (ref 0.61–1.24)
GFR calc Af Amer: 58 mL/min — ABNORMAL LOW (ref >60)
GFR calc non Af Amer: 50 mL/min — ABNORMAL LOW (ref >60)
GLUCOSE: 102 mg/dL — AB (ref 65–99)
POTASSIUM: 5 mmol/L (ref 3.5–5.1)
Sodium: 137 mmol/L (ref 135–145)
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
## 2017-03-07 ENCOUNTER — Encounter (HOSPITAL_COMMUNITY): Payer: Self-pay | Admitting: *Deleted

## 2017-03-07 MED ORDER — MORPHINE SULFATE (PF) 4 MG/ML IV SOLN: 4.0000 mg | INTRAVENOUS | Status: DC | PRN
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
## 2017-03-07 NOTE — Progress Notes (Signed)
[facility] (OTS)      Subjective: Patient reports pain as much improved from yesterday. Thinks probably ready to go home.    Objective: Current Vitals Blood pressure 124/69, pulse (!) 102, temperature 98.2 F (36.8 C), temperature source Oral, resp. rate 18, height 6' (1.829 m), weight 87.1 kg (192 lb), SpO2 90 %. Vital signs in last 24 hours: Temp:  [97.8 F (36.6 C)-98.2 F (36.8 C)] 98.2 F (36.8 C) (06/17 0533) Pulse Rate:  [92-102] 102 (06/17 0533) Resp:  [16-20] 18 (06/17 0533) BP: (115-124)/(68-72) 124/69 (06/17 0533) SpO2:  [90 %-92 %] 90 % (06/17 0533)  Intake/Output from previous day: 06/16 0701 - 06/17 0700 In: 600 [P.O.:600] Out: 1700 [Urine:1700]  LABS  Recent Labs  03/04/17 2320 03/06/17 0528  HGB 14.8 13.3    Recent Labs  03/04/17 2320 03/06/17 0528  WBC 17.4* 10.9*  RBC 4.59 4.21*  HCT 43.8 41.0  PLT 262 224    Recent Labs  03/04/17 2320 03/06/17 0746  NA 138 137  K 4.2 5.0  CL 108 108  CO2 22 21*  BUN 21* 18  CREATININE 2.06* 1.42*  GLUCOSE 156* 102*  CALCIUM 9.5 8.2*   No results for input(s): LABPT, INR in the last 72 hours.   Physical Exam R&LLE    Edema/ swelling controlled  Sens: DPN, SPN, TN intact  Motor: EHL, FHL, and lessor toe ext and flex 5/5 today  Brisk cap refill, warm to touch  Assessment/Plan: Pelvic fractures  OK for d/c 1. PT/OT WBAT left lower w/ walker; transition to crutches when able 2. preadmit antiplatelet therapy may be resumed 3. F/u 8-14 days  Altamese Twain, MD [facility] [phone] (p)

## 2017-03-08 DIAGNOSIS — N183 Chronic kidney disease, stage 3 unspecified: Secondary | ICD-10-CM

## 2017-03-08 DIAGNOSIS — D72829 Elevated white blood cell count, unspecified: Secondary | ICD-10-CM

## 2017-03-08 DIAGNOSIS — W19XXXD Unspecified fall, subsequent encounter: Secondary | ICD-10-CM

## 2017-03-08 DIAGNOSIS — I5032 Chronic diastolic (congestive) heart failure: Secondary | ICD-10-CM

## 2017-03-08 DIAGNOSIS — I10 Essential (primary) hypertension: Secondary | ICD-10-CM

## 2017-03-08 DIAGNOSIS — M25552 Pain in left hip: Secondary | ICD-10-CM

## 2017-03-08 DIAGNOSIS — J45909 Unspecified asthma, uncomplicated: Secondary | ICD-10-CM

## 2017-03-08 DIAGNOSIS — Z720 Tobacco use: Secondary | ICD-10-CM

## 2017-03-08 DIAGNOSIS — W19XXXA Unspecified fall, initial encounter: Secondary | ICD-10-CM

## 2017-03-08 DIAGNOSIS — S3289XD Fracture of other parts of pelvis, subsequent encounter for fracture with routine healing: Secondary | ICD-10-CM

## 2017-03-08 LAB — BASIC METABOLIC PANEL
ANION GAP: 6 (ref 5–15)
BUN: 18 mg/dL (ref 6–20)
CHLORIDE: 109 mmol/L (ref 101–111)
CO2: 22 mmol/L (ref 22–32)
CREATININE: 1.39 mg/dL — AB (ref 0.61–1.24)
Calcium: 8.1 mg/dL — ABNORMAL LOW (ref 8.9–10.3)
GFR calc non Af Amer: 52 mL/min — ABNORMAL LOW (ref >60)
GFR, EST AFRICAN AMERICAN: 60 mL/min — AB (ref >60)
GLUCOSE: 96 mg/dL (ref 65–99)
Potassium: 4.4 mmol/L (ref 3.5–5.1)
Sodium: 137 mmol/L (ref 135–145)
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
## 2017-03-08 NOTE — Progress Notes (Signed)
I met with pt and his sister at bedside to discuss Dr. Serita Grit rehab consult. Dr. Posey Pronto feels once pt's pain lessens, that pt will then mobilize better and can d/c home with assist. Pt not in need of an intense inpt rehab admission to reach that goal. I have updated RN CM. Pt and sister visibly upset with our recommendations. [phone]

## 2017-03-08 NOTE — Consult Note (Signed)
Physical Medicine and Rehabilitation Consult Reason for Consult: Decreased functional mobility Referring Physician: Trauma services   HPI: John Choi is a 66 y.o. right handed male with history of diastolic congestive heart failure, hypertension and asthma/tobacco abuse. Per chart review and patient, patient lives with spouse independent prior to admission. He is retired and wife can assist. One level home with one step to entry. Presented 03/05/2017 after a fall from a tractor without loss of consciousness. CT abdomen and pelvis showed nondisplaced fractures involving the left inferior and superior pubic rami with comminuted and displaced fracture involving the left pubic bone that extends to the pubic symphysis. Small bilateral anterior pelvic hematomas. Weightbearing as tolerated with conservative care pelvic fractures per orthopedic services Dr. Marcelino Scot. Hospital course pain management. Physical therapy evaluation completed with recommendations of physical medicine rehabilitation consult.   Review of Systems  Constitutional: Negative for chills and fever.  HENT: Negative for hearing loss.   Eyes: Negative for blurred vision and double vision.  Respiratory: Positive for cough.   Cardiovascular: Positive for leg swelling. Negative for chest pain and palpitations.  Gastrointestinal: Positive for constipation. Negative for nausea and vomiting.  Genitourinary: Negative for dysuria, flank pain and hematuria.  Musculoskeletal: Positive for joint pain and myalgias.  Skin: Negative for rash.  Neurological: Negative for seizures.  All other systems reviewed and are negative.  Past Medical History:  Diagnosis Date  . Asthma   . CHF (congestive heart failure) ([place])   . Head trauma   . High cholesterol   . History of kidney stones   . Hypertension   . Peripheral vascular disease ([place])   . Pubic ramus fracture ([place]) 02/2000   left; thrown from tractor/notes 03/05/2017   Past  Surgical History:  Procedure Laterality Date  . ABDOMINAL AORTIC ENDOVASCULAR STENT GRAFT N/A 07/20/2014   Procedure: ABDOMINAL AORTIC ENDOVASCULAR STENT GRAFT;  Surgeon: Serafina Mitchell, MD;  Location: [facility];  Service: Vascular;  Laterality: N/A;  . BACK SURGERY    . BREAST SURGERY    . CARDIAC CATHETERIZATION  05/2014  . COLONOSCOPY W/ POLYPECTOMY  1997  . CYSTOSCOPY W/ STONE MANIPULATION  1980's and 1990's X 4  . HERNIA REPAIR    . LAPAROSCOPIC ASSISTED VENTRAL HERNIA REPAIR     "right above my bellybutton"  . LEFT HEART CATHETERIZATION WITH CORONARY ANGIOGRAM N/A 06/11/2014   Procedure: LEFT HEART CATHETERIZATION WITH CORONARY ANGIOGRAM;  Surgeon: Blane Ohara, MD;  Location: [facility] CATH LAB;  Service: Cardiovascular;  Laterality: N/A;  . LUMBAR DISC SURGERY  X 2   "# 1, #2"  . LUMBAR SPINE HARDWARE REMOVAL     "#4"  . POSTERIOR LUMBAR FUSION     "#3"   Family History  Problem Relation Age of Onset  . Heart disease Mother 58       Before age 24  . Heart attack Mother   . Heart attack Father   . Stroke Father   . Heart disease Father        Before age 68  . Cancer Sister        Brain  . Heart disease Sister        Before age 68  . Diabetes Sister   . Cancer Brother        Throat, Liver, Pancreatic  . Stroke Brother   . Diabetes Brother    Social History:  reports that he has been smoking Cigarettes.  He has a 50.00  pack-year smoking history. He has never used smokeless tobacco. He reports that he does not drink alcohol or use drugs. Allergies: No Known Allergies Facility-Administered Medications Prior to Admission  Medication Dose Route Frequency Provider Last Rate Last Dose  . diazepam (VALIUM) tablet 5 mg  5 mg Oral Once [name], MD       Medications Prior to Admission  Medication Sig Dispense Refill  . albuterol (PROVENTIL HFA;VENTOLIN HFA) 108 (90 BASE) MCG/ACT inhaler Inhale 2 puffs into the lungs every 6 (six) hours as needed for wheezing or shortness of  breath.    Marland Kitchen amLODipine (NORVASC) 10 MG tablet Take 1 tablet (10 mg total) by mouth at bedtime. 90 tablet 3  . aspirin EC 81 MG tablet Take 81 mg by mouth at bedtime.     . metoprolol succinate (TOPROL-XL) 25 MG 24 hr tablet Take 0.5 tablets (12.5 mg total) by mouth daily. (Patient taking differently: Take 12.5 mg by mouth at bedtime. ) 45 tablet 3  . simvastatin (ZOCOR) 20 MG tablet TAKE 1 TABLET(20 MG) BY MOUTH AT BEDTIME 90 tablet 3    Home: Home Living Family/patient expects to be discharged to:: Private residence Living Arrangements: Spouse/significant other Available Help at Discharge: Friend(s), Family, Available 24 hours/day Type of Home: House Home Access: Stairs to enter Technical brewer of Steps: 1 Entrance Stairs-Rails: None Home Layout: One level Bathroom Shower/Tub: Tub/shower unit, Architectural technologist: Standard Bathroom Accessibility: Yes Home Equipment: Crutches, Bedside commode, Grab bars - toilet, Grab bars - tub/shower  Functional History: Prior Function Level of Independence: Independent Comments: community ambulator, retired, Ambulance person Status:  Mobility: Bed Mobility Overal bed mobility: Needs Assistance Bed Mobility: Supine to Sit Rolling:  (Pt refused to roll due to pain.  ) Supine to sit: Mod assist, +2 for physical assistance General bed mobility comments: Patient was already at theEOB upon arrival. He requested to stay at the edge of the bed. He was educated on log roll to get back into bed but was advised to call nursing to assist.  Transfers Overall transfer level: Needs assistance Equipment used: Rolling walker (2 wheeled) Transfer via Lift Equipment: Maximove Transfers: Sit to/from Stand Sit to Stand: Min assist Stand pivot transfers: Mod assist General transfer comment: Pt required min assist to ascend from seated surface with bed placed in an elevated position.  Pt required increased assistance and remains guarded due to pain.    Ambulation/Gait Ambulation/Gait assistance: Mod assist Ambulation Distance (Feet): 6 Feet Assistive device: Rolling walker (2 wheeled) Gait Pattern/deviations: Step-to pattern, Antalgic, Decreased step length - left, Decreased stance time - left, Decreased step length - right, Decreased stride length, Trunk flexed General Gait Details: Pt remains to present with poor foot clearance due to pain.  Pt requires physical assistance to advance LEs during gait.   Gait velocity: slowed  Gait velocity interpretation: Below normal speed for age/gender    ADL: ADL Overall ADL's : Needs assistance/impaired Eating/Feeding: Independent, Sitting Grooming: Wash/dry face, Set up, Sitting Upper Body Bathing: Set up, Sitting Lower Body Bathing: Maximal assistance, +2 for physical assistance, Sit to/from stand Upper Body Dressing : Set up, Sitting Lower Body Dressing: Maximal assistance, Sit to/from stand, +2 for safety/equipment, +2 for physical assistance Toilet Transfer: Maximal assistance, +2 for safety/equipment, +2 for physical assistance, Stand-pivot, BSC, RW Toilet Transfer Details (indicate cue type and reason): simulated during sit/stand beside bed with partial steps towards recliner Toileting- Clothing Manipulation and Hygiene: Maximal assistance, +2 for safety/equipment, Sit to/from stand Toileting -  Clothing Manipulation Details (indicate cue type and reason): simulated during sit/stand beside bed with partial steps towards recliner Functional mobility during ADLs: Maximal assistance, +2 for physical assistance, Rolling walker  Cognition: Cognition Overall Cognitive Status: Within Functional Limits for tasks assessed Orientation Level: Oriented X4 Cognition Arousal/Alertness: Awake/alert Behavior During Therapy: WFL for tasks assessed/performed Overall Cognitive Status: Within Functional Limits for tasks assessed  Blood pressure 113/63, pulse 85, temperature 98.4 F (36.9 C),  temperature source Oral, resp. rate 16, height 6' (1.829 m), weight 87.1 kg (192 lb), SpO2 92 %. Physical Exam  Vitals reviewed. Constitutional: He is oriented to person, place, and time. He appears well-developed.  Obese  HENT:  Head: Normocephalic and atraumatic.  Eyes: EOM are normal. Right eye exhibits no discharge. Left eye exhibits no discharge.  Neck: Normal range of motion. No thyromegaly present.  Cardiovascular: Normal rate and regular rhythm.   Respiratory: Effort normal and breath sounds normal. No respiratory distress.  GI: Soft. Bowel sounds are normal. He exhibits no distension.  Musculoskeletal: He exhibits edema and tenderness.  Neurological: He is alert and oriented to person, place, and time.  Motor: UE 5/5 proximal to distal B/l LE: HF 2/5, KE 4/5, ADF/PF 4+/5 (right stronger than left) Sensation intact to light touch  Skin: Skin is warm and dry.  Psychiatric: He has a normal mood and affect. His behavior is normal.    Results for orders placed or performed during the hospital encounter of 03/04/17 (from the past 24 hour(s))  Basic metabolic panel     Status: Abnormal   Collection Time: 03/08/17  5:18 AM  Result Value Ref Range   Sodium 137 135 - 145 mmol/L   Potassium 4.4 3.5 - 5.1 mmol/L   Chloride 109 101 - 111 mmol/L   CO2 22 22 - 32 mmol/L   Glucose, Bld 96 65 - 99 mg/dL   BUN 18 6 - 20 mg/dL   Creatinine, Ser 1.39 (H) 0.61 - 1.24 mg/dL   Calcium 8.1 (L) 8.9 - 10.3 mg/dL   GFR calc non Af Amer 52 (L) >60 mL/min   GFR calc Af Amer 60 (L) >60 mL/min   Anion gap 6 5 - 15   No results found.  Assessment/Plan: Diagnosis: Pubic fractures Labs independently reviewed.  Records reviewed and summated above.  1. Does the need for close, 24 hr/day medical supervision in concert with the patient's rehab needs make it unreasonable for this patient to be served in a less intensive setting? Potentially  2. Co-Morbidities requiring supervision/potential  complications: tobacco abuse (counsel), diastolic congestive heart failure (monitor for signs/symptoms of fluid overload), hypertension (monitor and provide prns in accordance with increased physical exertion and pain), asthma (monitor RR and O2 sats with increased activity), pain management (Biofeedback training with therapies to help reduce reliance on opiate pain medications, monitor pain control during therapies, and sedation at rest and titrate to maximum efficacy to ensure participation and gains in therapies), leukocytosis (cont to monitor for signs and symptoms of infection, further workup if indicated), CKD (avoid nephrotoxic meds) 3. Due to safety, pain management and patient education, does the patient require 24 hr/day rehab nursing? No 4. Does the patient require coordinated care of a physician, rehab nurse, PT (1-2 hrs/day, 5 days/week) and OT (1-2 hrs/day, 5 days/week) to address physical and functional deficits in the context of the above medical diagnosis(es)? Potentially Addressing deficits in the following areas: balance, endurance, locomotion, strength, transferring, bathing, dressing, toileting and psychosocial support 5. Can  the patient actively participate in an intensive therapy program of at least 3 hrs of therapy per day at least 5 days per week? Yes 6. The potential for patient to make measurable gains while on inpatient rehab is excellent 7. Anticipated functional outcomes upon discharge from inpatient rehab are modified independent  with PT, modified independent with OT, n/a with SLP. 8. Estimated rehab length of stay to reach the above functional goals is: 8-12 days. 9. Anticipated D/C setting: Home 10. Anticipated post D/C treatments: HH therapy and Home excercise program 11. Overall Rehab/Functional Prognosis: excellent  RECOMMENDATIONS: This patient's condition is appropriate for continued rehabilitative care in the following setting: Pt signficantly limited by pain.   Anticipate, pt will continue to make functional gains as pain improves. Patient has agreed to participate in recommended program. Yes Note that insurance prior authorization may be required for reimbursement for recommended care.  Comment: Rehab Admissions Coordinator to follow up.  Delice Lesch, MD, Mellody Drown Cathlyn Parsons., PA-C 03/08/2017

## 2017-03-08 NOTE — Progress Notes (Signed)
Physical Therapy Treatment Patient Details Name: John Choi MRN: [id number] DOB: 12/03/1950 Today's Date: 03/08/2017    History of Present Illness Pt is a 66 y/o M who sustained mechanical fall from a tractor, found to have complex left pubic rami fractures with one extending into the symphysis and small associated pelvic hematomas. PMHx inlcudes asthma, CHF, HTN, PVD    PT Comments    Pt performed increased activity, but remains limited due to pain.  Pt will benefit from rehab in an inpatient rehab center before returning home to improve functional mobility and decrease risk for falls.  Pt refusing SNF placement and will require HHPT and equipment below if insurance does not improve rehab stay.  Pt is an excellent candidate for aggressive rehab in the setting to improve function and strength before returning home.   Pt with an extensive history for spinal surgeries.    Follow Up Recommendations  CIR;Supervision/Assistance - 24 hour     Equipment Recommendations  Rolling walker with 5" wheels;3in1 (PT);Wheelchair cushion (measurements PT);Wheelchair (measurements PT);Other (comment) (hospital bed.  )    Recommendations for Other Services OT consult     Precautions / Restrictions Precautions Precautions: None Restrictions Weight Bearing Restrictions: No RLE Weight Bearing: Weight bearing as tolerated LLE Weight Bearing: Weight bearing as tolerated    Mobility  Bed Mobility Overal bed mobility: Needs Assistance Bed Mobility: Supine to Sit Rolling:  (Pt refused to roll due to pain.  )   Supine to sit: Mod assist;+2 for physical assistance        Transfers Overall transfer level: Needs assistance Equipment used: Rolling walker (2 wheeled) Transfers: Sit to/from Stand Sit to Stand: Min assist Stand pivot transfers: Mod assist       General transfer comment: Pt required min assist to ascend from seated surface with bed placed in an elevated position.  Pt required  increased assistance and remains guarded due to pain.    Ambulation/Gait Ambulation/Gait assistance: Mod assist Ambulation Distance (Feet): 6 Feet Assistive device: Rolling walker (2 wheeled) Gait Pattern/deviations: Step-to pattern;Antalgic;Decreased step length - left;Decreased stance time - left;Decreased step length - right;Decreased stride length;Trunk flexed Gait velocity: slowed  Gait velocity interpretation: Below normal speed for age/gender General Gait Details: Pt remains to present with poor foot clearance due to pain.  Pt requires physical assistance to advance LEs during gait.     Stairs            Wheelchair Mobility    Modified Rankin (Stroke Patients Only)       Balance Overall balance assessment: Needs assistance Sitting-balance support: Feet supported;Bilateral upper extremity supported Sitting balance-Leahy Scale: Fair       Standing balance-Leahy Scale: Poor                              Cognition Arousal/Alertness: Awake/alert Behavior During Therapy: WFL for tasks assessed/performed Overall Cognitive Status: Within Functional Limits for tasks assessed                                        Exercises General Exercises - Lower Extremity Ankle Circles/Pumps: AROM;Both;10 reps;Supine Quad Sets: AROM;Both;10 reps;Supine Gluteal Sets: AROM;Both;10 reps;Supine Long Arc Quad: AROM;Both;10 reps;Supine Heel Slides: AAROM;Both;10 reps;Supine    General Comments        Pertinent Vitals/Pain Pain Assessment: 0-10 Pain Score: 7  Pain  Location: L lower back/ Left hip Pain Descriptors / Indicators: Aching Pain Intervention(s): Monitored during session;Repositioned    Home Living                      Prior Function            PT Goals (current goals can now be found in the care plan section) Acute Rehab PT Goals Patient Stated Goal: to return home; regain independence  Potential to Achieve Goals:  Fair Progress towards PT goals: Progressing toward goals    Frequency    Min 5X/week      PT Plan Current plan remains appropriate    Co-evaluation              AM-PAC PT "6 Clicks" Daily Activity  Outcome Measure  Difficulty turning over in bed (including adjusting bedclothes, sheets and blankets)?: A Lot Difficulty moving from lying on back to sitting on the side of the bed? : A Lot Difficulty sitting down on and standing up from a chair with arms (e.g., wheelchair, bedside commode, etc,.)?: A Lot Help needed moving to and from a bed to chair (including a wheelchair)?: A Lot Help needed walking in hospital room?: A Lot Help needed climbing 3-5 steps with a railing? : Total 6 Click Score: 11    End of Session Equipment Utilized During Treatment: Gait belt Activity Tolerance: Patient limited by pain Patient left: in bed;with call bell/phone within reach Nurse Communication: Mobility status PT Visit Diagnosis: Other abnormalities of gait and mobility (R26.89);Muscle weakness (generalized) (M62.81);Difficulty in walking, not elsewhere classified (R26.2);Pain Pain - Right/Left: Left Pain - part of body: Hip     Time: 6720-9470 PT Time Calculation (min) (ACUTE ONLY): 33 min  Charges:  $Therapeutic Exercise: 8-22 mins $Therapeutic Activity: 8-22 mins                    G Codes:       Governor Rooks, PTA pager [phone]    Cristela Blue 03/08/2017, 11:48 AM

## 2017-03-08 NOTE — Progress Notes (Signed)
   Subjective/Chief Complaint: Pt doing well Still with some pelvic pain   Objective: Vital signs in last 24 hours: Temp:  [98.4 F (36.9 C)-98.6 F (37 C)] 98.4 F (36.9 C) (06/18 0541) Pulse Rate:  [85-90] 85 (06/18 0541) Resp:  [16] 16 (06/18 0541) BP: (113-120)/(63-66) 113/63 (06/18 0541) SpO2:  [92 %-100 %] 92 % (06/18 0541) Last BM Date: 03/04/17  Intake/Output from previous day: 06/17 0701 - 06/18 0700 In: 720 [P.O.:720] Out: 820 [Urine:820] Intake/Output this shift: Total I/O In: 240 [P.O.:240] Out: -   General appearance: alert and cooperative Cardio: regular rate and rhythm, S1, S2 normal, no murmur, click, rub or gallop GI: soft, non-tender; bowel sounds normal; no masses,  no organomegaly Male genitalia: ecchymosis  Lab Results:   Recent Labs  03/06/17 0528  WBC 10.9*  HGB 13.3  HCT 41.0  PLT 224   BMET  Recent Labs  03/06/17 0746 03/08/17 0518  NA 137 137  K 5.0 4.4  CL 108 109  CO2 21* 22  GLUCOSE 102* 96  BUN 18 18  CREATININE 1.42* 1.39*  CALCIUM 8.2* 8.1*    Assessment/Plan:  Tractor rollover Left superior and inferior pubic rami fractures with extension into the pubic bone - WBAT, PT/OT, pain control Small pelvic, scrotal hematoma - due to above,  Chronic renal insufficiency - creatinine improving. Normal saline hydration, follow-up Hypertension - home medications Dispo - Home vs rehab  Based on PT  LOS: 3 days    Rosario Jacks., [facility] 03/08/2017

## 2017-03-08 NOTE — Clinical Social Work Note (Signed)
CSW completed SBIRT and assessed. At this time pt has told CSW that his plan is to go home with his friend who will care for him, however pt unaware of projected d/c date.  At this time PT is recommending CIR. CSW will continue to follow to provide SNF backup--if pt agreeable, and if CIR declines admission.   [place], [place]

## 2017-03-08 NOTE — Progress Notes (Addendum)
Occupational Therapy Treatment Patient Details Name: John Choi MRN: [id number] DOB: 08/01/1951 Today's Date: 03/08/2017    History of present illness Pt is a 66 y/o M who sustained mechanical fall from a tractor, found to have complex left pubic rami fractures with one extending into the symphysis and small associated pelvic hematomas. PMHx inlcudes asthma, CHF, HTN, PVD   OT comments  Pt making progress with functional goals, however remains limited due to pain.  Pt will benefit from rehab in an inpatient rehab center before returning home to improve functional mobility and decrease risk for falls.  Pt refusing SNF placement and will require [place] OT, [place] equipment below if insurance does not improve rehab stay.  Pt is an excellent candidate for aggressive rehab in the setting to improve function and strength before returning home.   Pt with an extensive history for spinal surgeries.    Follow Up Recommendations       Equipment Recommendations  3 in 1, ADL A/E kit, shower chair    Recommendations for Other Services Rehab consult    Precautions / Restrictions Precautions Precautions: Fall Restrictions Weight Bearing Restrictions: No RLE Weight Bearing: Weight bearing as tolerated LLE Weight Bearing: Weight bearing as tolerated       Mobility Bed Mobility Overal bed mobility: Needs Assistance Bed Mobility: Supine to Sit Rolling:  (Pt refused to roll due to pain.  )   Supine to sit: Mod assist;+2 for physical assistance     General bed mobility comments: pt up in recliner upon OT arrival  Transfers Overall transfer level: Needs assistance Equipment used: Rolling walker (2 wheeled) Transfers: Sit to/from Stand Sit to Stand: Min assist Stand pivot transfers: Mod assist       General transfer comment: Pt required min assist to ascend from seated surface with bed placed in an elevated position.  Pt required increased assistance and remains guarded due to pain.       Balance Overall balance assessment: Needs assistance Sitting-balance support: Feet supported;Bilateral upper extremity supported Sitting balance-Leahy Scale: Fair       Standing balance-Leahy Scale: Poor                             ADL either performed or assessed with clinical judgement   ADL       Grooming: Wash/dry face;Set up;Sitting;Wash/dry hands       Lower Body Bathing: Sitting/lateral leans;Moderate assistance (simulated)       Lower Body Dressing: Maximal assistance;Sitting/lateral leans (simulated)   Toilet Transfer: BSC;RW;Minimal assistance   Toileting- Clothing Manipulation and Hygiene: Maximal assistance;Sit to/from stand       Functional mobility during ADLs: Rolling walker;Moderate assistance;Minimal assistance General ADL Comments: mod A to advance R LE during ADL mobiluty and from [place] Vision/History: No visual deficits                Cognition Arousal/Alertness: Awake/alert Behavior During Therapy: WFL for tasks assessed/performed Overall Cognitive Status: Within Functional Limits for tasks assessed                                                     General Comments  pt very pleasant and cooperative    Pertinent Vitals/ Pain  Pain Assessment: 0-10 Pain Score: 6  Pain Location: L hip, L lower back Pain Descriptors / Indicators: Aching;Sore Pain Intervention(s): Limited activity within patient's tolerance;Monitored during session;Repositioned;RN gave pain meds during session  Home Living                                          Prior Functioning/Environment              Frequency  Min 2X/week        Progress Toward Goals  OT Goals(current goals can now be found in the care plan section)  Progress towards OT goals: Progressing toward goals  Acute Rehab OT Goals Patient Stated Goal: to return home; regain independence   Plan Discharge plan  needs to be updated;Frequency remains appropriate                     AM-PAC PT "6 Clicks" Daily Activity     Outcome Measure   Help from another person eating meals?: None Help from another person taking care of personal grooming?: A Little Help from another person toileting, which includes using toliet, bedpan, or urinal?: A Lot Help from another person bathing (including washing, rinsing, drying)?: A Lot Help from another person to put on and taking off regular upper body clothing?: A Lot Help from another person to put on and taking off regular lower body clothing?: A Lot 6 Click Score: 15    End of Session Equipment Utilized During Treatment: Gait belt;Rolling walker  OT Visit Diagnosis: Pain;Muscle weakness (generalized) (M62.81);Unsteadiness on feet (R26.81) Pain - Right/Left: Left   Activity Tolerance Patient limited by pain   Patient Left in chair;with call bell/phone within reach;with family/visitor present   Nurse Communication      Functional Assessment Tool Used: AM-PAC 6 Clicks Daily Activity   Time: 6168-3729 OT Time Calculation (min): 27 min  Charges: OT G-codes **NOT FOR INPATIENT CLASS** Functional Assessment Tool Used: AM-PAC 6 Clicks Daily Activity OT General Charges $OT Visit: 1 Procedure OT Treatments $Self Care/Home Management : 8-22 mins $Therapeutic Activity: 8-22 mins     John Choi 03/08/2017, 1:06 PM

## 2017-03-09 ENCOUNTER — Inpatient Hospital Stay (HOSPITAL_COMMUNITY): Payer: Medicare Other | Admitting: Occupational Therapy

## 2017-03-09 ENCOUNTER — Inpatient Hospital Stay (HOSPITAL_COMMUNITY): Payer: Medicare Other | Admitting: Physical Therapy

## 2017-03-09 LAB — BASIC METABOLIC PANEL
ANION GAP: 7 (ref 5–15)
BUN: 21 mg/dL — ABNORMAL HIGH (ref 6–20)
CHLORIDE: 110 mmol/L (ref 101–111)
CO2: 20 mmol/L — AB (ref 22–32)
CREATININE: 1.47 mg/dL — AB (ref 0.61–1.24)
Calcium: 9 mg/dL (ref 8.9–10.3)
GFR calc non Af Amer: 48 mL/min — ABNORMAL LOW (ref >60)
GFR, EST AFRICAN AMERICAN: 56 mL/min — AB (ref >60)
GLUCOSE: 101 mg/dL — AB (ref 65–99)
Potassium: 4.2 mmol/L (ref 3.5–5.1)
Sodium: 137 mmol/L (ref 135–145)

## 2017-03-09 MED ORDER — HYDROCODONE-ACETAMINOPHEN 5-325 MG PO TABS: 1.0000 | ORAL_TABLET | ORAL | 0 refills | Status: DC | PRN

## 2017-03-09 MED ORDER — DOCUSATE SODIUM 100 MG PO CAPS: 100.0000 mg | ORAL_CAPSULE | Freq: Two times a day (BID) | ORAL | 0 refills | Status: DC

## 2017-03-09 MED ORDER — PANTOPRAZOLE SODIUM 40 MG PO TBEC: 40.0000 mg | DELAYED_RELEASE_TABLET | Freq: Every day | ORAL | 0 refills | Status: DC

## 2017-03-09 MED ORDER — ACETAMINOPHEN 325 MG PO TABS: 325.0000 mg | ORAL_TABLET | Freq: Four times a day (QID) | ORAL | Status: DC | PRN

## 2017-03-09 MED ORDER — POLYETHYLENE GLYCOL 3350 17 G PO PACK: 17.0000 g | PACK | Freq: Every day | ORAL | 0 refills | Status: DC | PRN

## 2017-03-09 MED ORDER — ENOXAPARIN SODIUM 40 MG/0.4ML ~~LOC~~ SOLN
40.0000 mg | SUBCUTANEOUS | Status: DC
  Administered 2017-03-09: 40 mg via SUBCUTANEOUS
  Filled 2017-03-09: qty 0.4

## 2017-03-09 MED ORDER — METHOCARBAMOL 750 MG PO TABS: 1500.0000 mg | ORAL_TABLET | Freq: Four times a day (QID) | ORAL | 0 refills | Status: DC | PRN

## 2017-03-09 NOTE — Care Management Note (Signed)
Case Management Note  Patient Details  Name: John Choi MRN: [id number] Date of Birth: [birth year]  Subjective/Objective:  Pt is a 66 y/o M who sustained mechanical fall from a tractor, found to have complex left pubic rami fractures with one extending into the symphysis and small associated pelvic hematomas.  PTA, pt independent, lives at home with significant other.    Action/Plan: Pt medically stable for discharge home later today.  Met with pt and girlfriend to discuss dc arrangements.  Pt agreeable to [facility] follow up; he prefers [facility] for [facility] needs.  Pt states he has access to RW and 3 in 1, but is adamant about not getting hospital bed.  He states it is not comfortable, and he states he does not need it.  His girlfriend does not agree with this.  Pt agrees to work with PT again on bed mobility prior to dc to determine whether or not bed is needed.  For now, will order WC/cushion.  Paged physical therapist to see when she can come back and work with pt on bed mobility.  Referral to [facility] for [facility] and DME needs.    Expected Discharge Date:     03/09/2017             Expected Discharge Plan:  [place]  In-House Referral:  Clinical Social Work  Discharge planning Services  CM Consult  Post Acute Care Choice:  Home Health Choice offered to:  Patient  DME Arranged:  Programmer, multimedia DME Agency:  [place]:  PT, OT [facility] Agency:  [place]  Status of Service:  In process, will continue to follow  If discussed at Long Length of Stay Meetings, dates discussed:    Additional Comments:  Reinaldo Raddle, RN, BSN  Trauma/Neuro ICU Case Manager [phone]

## 2017-03-09 NOTE — Progress Notes (Addendum)
    Durable Medical Equipment        Start     Ordered   03/09/17 0000  For home use only DME Walker rolling    Question:  Patient needs a walker to treat with the following condition  Answer:  Pelvic fracture ([place])   03/09/17 0924   03/09/17 0000  For home use only DME wheelchair cushion (seat and back)     03/09/17 0924   03/09/17 0000  For home use only DME standard manual wheelchair with seat cushion    Comments:  Patient suffers from pelvic fractures which impairs their ability to perform daily activities like bathing in the home.  A walker will not resolve  issue with performing activities of daily living. A wheelchair will allow patient to safely perform daily activities. Patient can safely propel the wheelchair in the home or has a caregiver who can provide assistance.  Accessories: elevating leg rests (ELRs), wheel locks, extensions and anti-tippers.   03/09/17 0924   03/09/17 0000  For home use only DME 3 n 1     03/09/17 0924   03/09/17 0000  For home use only DME Shower stool     03/09/17 0924   03/09/17 0000  DME Hospital bed    Question Answer Comment  The above medical condition requires: Patient requires the ability to reposition frequently   Bed type Semi-electric      03/09/17 0924

## 2017-03-09 NOTE — Progress Notes (Signed)
Pt discharged from unit to pvt auto to home with wife. DME with pt. All personal belongings with pt.  D/C instuctions and rx reviewed with pt. No distress, no c/o offered by pt.
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
## 2017-03-09 NOTE — Progress Notes (Signed)
Occupational Therapy Treatment Patient Details Name: John Choi MRN: [id number] DOB: [birth year] Today's Date: 03/09/2017    History of present illness Pt is a 66 y/o M who sustained mechanical fall from a tractor, found to have complex left pubic rami fractures with one extending into the symphysis and small associated pelvic hematomas. PMHx inlcudes asthma, CHF, HTN, PVD   OT comments  Focus of session on educating pt in compensatory strategies for bed mobility and LB bathing and dressing. Pt and girlfriend verbalizing understanding of all information. Pt progressing well and is very eager to discharge. Awaiting w/c.  Follow Up Recommendations  Supervision/Assistance - 24 hour    Equipment Recommendations  Wheelchair (measurements OT);Wheelchair cushion (measurements OT)    Recommendations for Other Services      Precautions / Restrictions Precautions Precautions: Fall Restrictions RLE Weight Bearing: Weight bearing as tolerated LLE Weight Bearing: Weight bearing as tolerated       Mobility Bed Mobility               General bed mobility comments: Pt encouraged to practice bed mobility with HOB down and without use of rail, pt declined. Educated in compensatory technique using placement of w/c and belts attached to bed frame to as bed ladder to pull up.  Transfers Overall transfer level: Needs assistance Equipment used: Rolling walker (2 wheeled) Transfers: Sit to/from Stand Sit to Stand: Min guard         General transfer comment: cues for hand placement, sitting surface elevated.    Balance Overall balance assessment: Needs assistance Sitting-balance support: Feet supported;Bilateral upper extremity supported Sitting balance-Leahy Scale: Good       Standing balance-Leahy Scale: Fair Standing balance comment: can release walker in standing to manage pants                           ADL either performed or assessed with clinical judgement    ADL Overall ADL's : Needs assistance/impaired     Grooming: Wash/dry hands;Min guard;Standing       Lower Body Bathing: Min guard;Sit to/from stand Lower Body Bathing Details (indicate cue type and reason): issued long handled bath sponge and instructed in use     Lower Body Dressing: Min guard;Sit to/from stand Lower Body Dressing Details (indicate cue type and reason): issued and instructed in use of reacher, long shoe horn and sock aide Toilet Transfer: Min guard;Ambulation;BSC;RW Toilet Transfer Details (indicate cue type and reason): pt is aware he can use 3 in 1 over toilet Toileting- Clothing Manipulation and Hygiene: Min guard;Sit to/from Nurse, children's Details (indicate cue type and reason): pt declined practice, but demonstrated to pt and girlfriend technique with walker, pt has a shower seat with back Functional mobility during ADLs: Min guard;Rolling walker       Vision       Perception     Praxis      Cognition Arousal/Alertness: Awake/alert Behavior During Therapy: WFL for tasks assessed/performed Overall Cognitive Status: Within Functional Limits for tasks assessed                                          Exercises     Shoulder Instructions       General Comments      Pertinent Vitals/ Pain       Pain  Assessment: Faces Faces Pain Scale: Hurts little more Pain Location: L hip, L lower back Pain Descriptors / Indicators: Aching;Sore Pain Intervention(s): Monitored during session;Premedicated before session;Repositioned  Home Living                                          Prior Functioning/Environment              Frequency  Min 2X/week        Progress Toward Goals  OT Goals(current goals can now be found in the care plan section)  Progress towards OT goals: Progressing toward goals  Acute Rehab OT Goals Patient Stated Goal: to return home; regain independence  Time For Goal  Achievement: 03/19/17 Potential to Achieve Goals: Good  Plan Discharge plan remains appropriate    Co-evaluation                 AM-PAC PT "6 Clicks" Daily Activity     Outcome Measure   Help from another person eating meals?: None Help from another person taking care of personal grooming?: A Little Help from another person toileting, which includes using toliet, bedpan, or urinal?: A Little Help from another person bathing (including washing, rinsing, drying)?: A Little Help from another person to put on and taking off regular upper body clothing?: None Help from another person to put on and taking off regular lower body clothing?: A Little 6 Click Score: 20    End of Session Equipment Utilized During Treatment: Gait belt;Rolling walker  OT Visit Diagnosis: Pain;Muscle weakness (generalized) (M62.81);Unsteadiness on feet (R26.81)   Activity Tolerance Patient tolerated treatment well   Patient Left in chair;with call bell/phone within reach;with family/visitor present   Nurse Communication          Time: 1700-1749 OT Time Calculation (min): 32 min  Charges: OT General Charges $OT Visit: 1 Procedure OT Treatments $Self Care/Home Management : 23-37 mins    Malka So 03/09/2017, 3:08 PM  [phone]

## 2017-03-09 NOTE — Care Management Important Message (Signed)
Important Message  Patient Details  Name: John Choi MRN: [id number] Date of Birth: November 17, 1950   Medicare Important Message Given:  Yes    John Choi 03/09/2017, 11:20 AM
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
## 2017-03-09 NOTE — Care Management Note (Signed)
Case Management Note  Patient Details  Name: John Choi MRN: [id number] Date of Birth: [birth year]  Subjective/Objective:  Pt is a 66 y/o M who sustained mechanical fall from a tractor, found to have complex left pubic rami fractures with one extending into the symphysis and small associated pelvic hematomas.  PTA, pt independent, lives at home with significant other.    Action/Plan: Pt medically stable for discharge home later today.  Met with pt and girlfriend to discuss dc arrangements.  Pt agreeable to [facility] follow up; he prefers [facility] for [facility] needs.  Pt states he has access to RW and 3 in 1, but is adamant about not getting hospital bed.  He states it is not comfortable, and he states he does not need it.  His girlfriend does not agree with this.  Pt agrees to work with PT again on bed mobility prior to dc to determine whether or not bed is needed.  For now, will order WC/cushion.  Paged physical therapist to see when she can come back and work with pt on bed mobility.  Referral to [facility] for [facility] and DME needs.    Expected Discharge Date:  03/09/17  03/09/2017             Expected Discharge Plan:  [place]  In-House Referral:  Clinical Social Work  Discharge planning Services  CM Consult  Post Acute Care Choice:  Home Health Choice offered to:  Patient  DME Arranged:  Wheelchair manual [facility] cushion) DME Agency:  [place]:  PT, OT [place] Agency:  [place]  Status of Service:  Completed, signed off  If discussed at [place] of Stay Meetings, dates discussed:    Additional Comments:  03/09/17 J. Ernie Sagrero, RN, BSN   1406  Pt continues to refuse hospital bed.  He states he is ready to be discharged.  Notified PA, who states she will discharge pt/prepare Rx.  Will check status of WC delivery.    Reinaldo Raddle, RN, BSN  Trauma/Neuro ICU Case Manager [phone]

## 2017-03-10 DIAGNOSIS — I739 Peripheral vascular disease, unspecified: Secondary | ICD-10-CM | POA: Diagnosis not present

## 2017-03-10 DIAGNOSIS — S32512D Fracture of superior rim of left pubis, subsequent encounter for fracture with routine healing: Secondary | ICD-10-CM | POA: Diagnosis not present

## 2017-03-10 DIAGNOSIS — I130 Hypertensive heart and chronic kidney disease with heart failure and stage 1 through stage 4 chronic kidney disease, or unspecified chronic kidney disease: Secondary | ICD-10-CM | POA: Diagnosis not present

## 2017-03-10 DIAGNOSIS — J45909 Unspecified asthma, uncomplicated: Secondary | ICD-10-CM | POA: Diagnosis not present

## 2017-03-10 DIAGNOSIS — I509 Heart failure, unspecified: Secondary | ICD-10-CM | POA: Diagnosis not present

## 2017-03-10 DIAGNOSIS — S32592D Other specified fracture of left pubis, subsequent encounter for fracture with routine healing: Secondary | ICD-10-CM | POA: Diagnosis not present

## 2017-03-10 DIAGNOSIS — S300XXD Contusion of lower back and pelvis, subsequent encounter: Secondary | ICD-10-CM | POA: Diagnosis not present

## 2017-03-10 DIAGNOSIS — N189 Chronic kidney disease, unspecified: Secondary | ICD-10-CM | POA: Diagnosis not present

## 2017-03-10 DIAGNOSIS — Z95828 Presence of other vascular implants and grafts: Secondary | ICD-10-CM | POA: Diagnosis not present

## 2017-03-10 DIAGNOSIS — F17210 Nicotine dependence, cigarettes, uncomplicated: Secondary | ICD-10-CM | POA: Diagnosis not present

## 2017-03-11 DIAGNOSIS — S32512D Fracture of superior rim of left pubis, subsequent encounter for fracture with routine healing: Secondary | ICD-10-CM | POA: Diagnosis not present

## 2017-03-11 DIAGNOSIS — S300XXD Contusion of lower back and pelvis, subsequent encounter: Secondary | ICD-10-CM | POA: Diagnosis not present

## 2017-03-11 DIAGNOSIS — I509 Heart failure, unspecified: Secondary | ICD-10-CM | POA: Diagnosis not present

## 2017-03-11 DIAGNOSIS — N189 Chronic kidney disease, unspecified: Secondary | ICD-10-CM | POA: Diagnosis not present

## 2017-03-11 DIAGNOSIS — I130 Hypertensive heart and chronic kidney disease with heart failure and stage 1 through stage 4 chronic kidney disease, or unspecified chronic kidney disease: Secondary | ICD-10-CM | POA: Diagnosis not present

## 2017-03-11 DIAGNOSIS — S32592D Other specified fracture of left pubis, subsequent encounter for fracture with routine healing: Secondary | ICD-10-CM | POA: Diagnosis not present

## 2017-03-12 DIAGNOSIS — N189 Chronic kidney disease, unspecified: Secondary | ICD-10-CM | POA: Diagnosis not present

## 2017-03-12 DIAGNOSIS — S32592D Other specified fracture of left pubis, subsequent encounter for fracture with routine healing: Secondary | ICD-10-CM | POA: Diagnosis not present

## 2017-03-12 DIAGNOSIS — S300XXD Contusion of lower back and pelvis, subsequent encounter: Secondary | ICD-10-CM | POA: Diagnosis not present

## 2017-03-12 DIAGNOSIS — I509 Heart failure, unspecified: Secondary | ICD-10-CM | POA: Diagnosis not present

## 2017-03-12 DIAGNOSIS — I130 Hypertensive heart and chronic kidney disease with heart failure and stage 1 through stage 4 chronic kidney disease, or unspecified chronic kidney disease: Secondary | ICD-10-CM | POA: Diagnosis not present

## 2017-03-12 DIAGNOSIS — S32512D Fracture of superior rim of left pubis, subsequent encounter for fracture with routine healing: Secondary | ICD-10-CM | POA: Diagnosis not present

## 2017-03-15 DIAGNOSIS — S32512D Fracture of superior rim of left pubis, subsequent encounter for fracture with routine healing: Secondary | ICD-10-CM | POA: Diagnosis not present

## 2017-03-15 DIAGNOSIS — S300XXD Contusion of lower back and pelvis, subsequent encounter: Secondary | ICD-10-CM | POA: Diagnosis not present

## 2017-03-15 DIAGNOSIS — S32592D Other specified fracture of left pubis, subsequent encounter for fracture with routine healing: Secondary | ICD-10-CM | POA: Diagnosis not present

## 2017-03-15 DIAGNOSIS — I130 Hypertensive heart and chronic kidney disease with heart failure and stage 1 through stage 4 chronic kidney disease, or unspecified chronic kidney disease: Secondary | ICD-10-CM | POA: Diagnosis not present

## 2017-03-15 DIAGNOSIS — N189 Chronic kidney disease, unspecified: Secondary | ICD-10-CM | POA: Diagnosis not present

## 2017-03-15 DIAGNOSIS — I509 Heart failure, unspecified: Secondary | ICD-10-CM | POA: Diagnosis not present

## 2017-03-16 DIAGNOSIS — I509 Heart failure, unspecified: Secondary | ICD-10-CM | POA: Diagnosis not present

## 2017-03-16 DIAGNOSIS — S32512D Fracture of superior rim of left pubis, subsequent encounter for fracture with routine healing: Secondary | ICD-10-CM | POA: Diagnosis not present

## 2017-03-16 DIAGNOSIS — S32592D Other specified fracture of left pubis, subsequent encounter for fracture with routine healing: Secondary | ICD-10-CM | POA: Diagnosis not present

## 2017-03-16 DIAGNOSIS — I130 Hypertensive heart and chronic kidney disease with heart failure and stage 1 through stage 4 chronic kidney disease, or unspecified chronic kidney disease: Secondary | ICD-10-CM | POA: Diagnosis not present

## 2017-03-16 DIAGNOSIS — S300XXD Contusion of lower back and pelvis, subsequent encounter: Secondary | ICD-10-CM | POA: Diagnosis not present

## 2017-03-16 DIAGNOSIS — N189 Chronic kidney disease, unspecified: Secondary | ICD-10-CM | POA: Diagnosis not present
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
## 2017-03-17 DIAGNOSIS — I509 Heart failure, unspecified: Secondary | ICD-10-CM | POA: Diagnosis not present

## 2017-03-17 DIAGNOSIS — S300XXD Contusion of lower back and pelvis, subsequent encounter: Secondary | ICD-10-CM | POA: Diagnosis not present

## 2017-03-17 DIAGNOSIS — I130 Hypertensive heart and chronic kidney disease with heart failure and stage 1 through stage 4 chronic kidney disease, or unspecified chronic kidney disease: Secondary | ICD-10-CM | POA: Diagnosis not present

## 2017-03-17 DIAGNOSIS — S32592D Other specified fracture of left pubis, subsequent encounter for fracture with routine healing: Secondary | ICD-10-CM | POA: Diagnosis not present

## 2017-03-17 DIAGNOSIS — S32512D Fracture of superior rim of left pubis, subsequent encounter for fracture with routine healing: Secondary | ICD-10-CM | POA: Diagnosis not present

## 2017-03-17 DIAGNOSIS — N189 Chronic kidney disease, unspecified: Secondary | ICD-10-CM | POA: Diagnosis not present

## 2017-03-19 DIAGNOSIS — N189 Chronic kidney disease, unspecified: Secondary | ICD-10-CM | POA: Diagnosis not present

## 2017-03-19 DIAGNOSIS — S300XXD Contusion of lower back and pelvis, subsequent encounter: Secondary | ICD-10-CM | POA: Diagnosis not present

## 2017-03-19 DIAGNOSIS — I509 Heart failure, unspecified: Secondary | ICD-10-CM | POA: Diagnosis not present

## 2017-03-19 DIAGNOSIS — S32512D Fracture of superior rim of left pubis, subsequent encounter for fracture with routine healing: Secondary | ICD-10-CM | POA: Diagnosis not present

## 2017-03-19 DIAGNOSIS — I130 Hypertensive heart and chronic kidney disease with heart failure and stage 1 through stage 4 chronic kidney disease, or unspecified chronic kidney disease: Secondary | ICD-10-CM | POA: Diagnosis not present

## 2017-03-19 DIAGNOSIS — S32592D Other specified fracture of left pubis, subsequent encounter for fracture with routine healing: Secondary | ICD-10-CM | POA: Diagnosis not present

## 2017-03-21 DIAGNOSIS — S32592D Other specified fracture of left pubis, subsequent encounter for fracture with routine healing: Secondary | ICD-10-CM | POA: Diagnosis not present

## 2017-03-21 DIAGNOSIS — S300XXD Contusion of lower back and pelvis, subsequent encounter: Secondary | ICD-10-CM | POA: Diagnosis not present

## 2017-03-21 DIAGNOSIS — I509 Heart failure, unspecified: Secondary | ICD-10-CM | POA: Diagnosis not present

## 2017-03-21 DIAGNOSIS — S32512D Fracture of superior rim of left pubis, subsequent encounter for fracture with routine healing: Secondary | ICD-10-CM | POA: Diagnosis not present

## 2017-03-21 DIAGNOSIS — I130 Hypertensive heart and chronic kidney disease with heart failure and stage 1 through stage 4 chronic kidney disease, or unspecified chronic kidney disease: Secondary | ICD-10-CM | POA: Diagnosis not present

## 2017-03-21 DIAGNOSIS — N189 Chronic kidney disease, unspecified: Secondary | ICD-10-CM | POA: Diagnosis not present

## 2017-03-22 DIAGNOSIS — L089 Local infection of the skin and subcutaneous tissue, unspecified: Secondary | ICD-10-CM | POA: Diagnosis not present

## 2017-03-22 DIAGNOSIS — Z1389 Encounter for screening for other disorder: Secondary | ICD-10-CM | POA: Diagnosis not present

## 2017-03-22 DIAGNOSIS — Z6827 Body mass index (BMI) 27.0-27.9, adult: Secondary | ICD-10-CM | POA: Diagnosis not present

## 2017-03-22 DIAGNOSIS — R600 Localized edema: Secondary | ICD-10-CM | POA: Diagnosis not present

## 2017-03-23 DIAGNOSIS — S32592D Other specified fracture of left pubis, subsequent encounter for fracture with routine healing: Secondary | ICD-10-CM | POA: Diagnosis not present

## 2017-03-23 DIAGNOSIS — I509 Heart failure, unspecified: Secondary | ICD-10-CM | POA: Diagnosis not present

## 2017-03-23 DIAGNOSIS — S32512D Fracture of superior rim of left pubis, subsequent encounter for fracture with routine healing: Secondary | ICD-10-CM | POA: Diagnosis not present

## 2017-03-23 DIAGNOSIS — S300XXD Contusion of lower back and pelvis, subsequent encounter: Secondary | ICD-10-CM | POA: Diagnosis not present

## 2017-03-23 DIAGNOSIS — I130 Hypertensive heart and chronic kidney disease with heart failure and stage 1 through stage 4 chronic kidney disease, or unspecified chronic kidney disease: Secondary | ICD-10-CM | POA: Diagnosis not present

## 2017-03-23 DIAGNOSIS — N189 Chronic kidney disease, unspecified: Secondary | ICD-10-CM | POA: Diagnosis not present

## 2017-03-25 DIAGNOSIS — N189 Chronic kidney disease, unspecified: Secondary | ICD-10-CM | POA: Diagnosis not present

## 2017-03-25 DIAGNOSIS — S32512D Fracture of superior rim of left pubis, subsequent encounter for fracture with routine healing: Secondary | ICD-10-CM | POA: Diagnosis not present

## 2017-03-25 DIAGNOSIS — I509 Heart failure, unspecified: Secondary | ICD-10-CM | POA: Diagnosis not present

## 2017-03-25 DIAGNOSIS — S300XXD Contusion of lower back and pelvis, subsequent encounter: Secondary | ICD-10-CM | POA: Diagnosis not present

## 2017-03-25 DIAGNOSIS — I130 Hypertensive heart and chronic kidney disease with heart failure and stage 1 through stage 4 chronic kidney disease, or unspecified chronic kidney disease: Secondary | ICD-10-CM | POA: Diagnosis not present

## 2017-03-25 DIAGNOSIS — S32592D Other specified fracture of left pubis, subsequent encounter for fracture with routine healing: Secondary | ICD-10-CM | POA: Diagnosis not present

## 2017-03-29 DIAGNOSIS — S32501D Unspecified fracture of right pubis, subsequent encounter for fracture with routine healing: Secondary | ICD-10-CM | POA: Diagnosis not present

## 2017-03-30 DIAGNOSIS — I130 Hypertensive heart and chronic kidney disease with heart failure and stage 1 through stage 4 chronic kidney disease, or unspecified chronic kidney disease: Secondary | ICD-10-CM | POA: Diagnosis not present

## 2017-03-30 DIAGNOSIS — N189 Chronic kidney disease, unspecified: Secondary | ICD-10-CM | POA: Diagnosis not present

## 2017-03-30 DIAGNOSIS — S32592D Other specified fracture of left pubis, subsequent encounter for fracture with routine healing: Secondary | ICD-10-CM | POA: Diagnosis not present

## 2017-03-30 DIAGNOSIS — I509 Heart failure, unspecified: Secondary | ICD-10-CM | POA: Diagnosis not present

## 2017-03-30 DIAGNOSIS — S300XXD Contusion of lower back and pelvis, subsequent encounter: Secondary | ICD-10-CM | POA: Diagnosis not present

## 2017-03-30 DIAGNOSIS — S32512D Fracture of superior rim of left pubis, subsequent encounter for fracture with routine healing: Secondary | ICD-10-CM | POA: Diagnosis not present

## 2017-04-05 DIAGNOSIS — S300XXD Contusion of lower back and pelvis, subsequent encounter: Secondary | ICD-10-CM | POA: Diagnosis not present

## 2017-04-05 DIAGNOSIS — S32512D Fracture of superior rim of left pubis, subsequent encounter for fracture with routine healing: Secondary | ICD-10-CM | POA: Diagnosis not present

## 2017-04-05 DIAGNOSIS — I509 Heart failure, unspecified: Secondary | ICD-10-CM | POA: Diagnosis not present

## 2017-04-05 DIAGNOSIS — I130 Hypertensive heart and chronic kidney disease with heart failure and stage 1 through stage 4 chronic kidney disease, or unspecified chronic kidney disease: Secondary | ICD-10-CM | POA: Diagnosis not present

## 2017-04-05 DIAGNOSIS — S32592D Other specified fracture of left pubis, subsequent encounter for fracture with routine healing: Secondary | ICD-10-CM | POA: Diagnosis not present

## 2017-04-05 DIAGNOSIS — N189 Chronic kidney disease, unspecified: Secondary | ICD-10-CM | POA: Diagnosis not present

## 2017-04-08 DIAGNOSIS — S32512D Fracture of superior rim of left pubis, subsequent encounter for fracture with routine healing: Secondary | ICD-10-CM | POA: Diagnosis not present

## 2017-04-08 DIAGNOSIS — S32592D Other specified fracture of left pubis, subsequent encounter for fracture with routine healing: Secondary | ICD-10-CM | POA: Diagnosis not present

## 2017-04-08 DIAGNOSIS — I509 Heart failure, unspecified: Secondary | ICD-10-CM | POA: Diagnosis not present

## 2017-04-08 DIAGNOSIS — N189 Chronic kidney disease, unspecified: Secondary | ICD-10-CM | POA: Diagnosis not present

## 2017-04-08 DIAGNOSIS — I130 Hypertensive heart and chronic kidney disease with heart failure and stage 1 through stage 4 chronic kidney disease, or unspecified chronic kidney disease: Secondary | ICD-10-CM | POA: Diagnosis not present

## 2017-04-08 DIAGNOSIS — S300XXD Contusion of lower back and pelvis, subsequent encounter: Secondary | ICD-10-CM | POA: Diagnosis not present

## 2017-04-12 ENCOUNTER — Other Ambulatory Visit: Payer: Self-pay

## 2017-04-21 DIAGNOSIS — E663 Overweight: Secondary | ICD-10-CM | POA: Diagnosis not present

## 2017-04-21 DIAGNOSIS — I10 Essential (primary) hypertension: Secondary | ICD-10-CM | POA: Diagnosis not present

## 2017-04-21 DIAGNOSIS — Z0000 Encounter for general adult medical examination without abnormal findings: Secondary | ICD-10-CM | POA: Diagnosis not present

## 2017-04-21 DIAGNOSIS — J45909 Unspecified asthma, uncomplicated: Secondary | ICD-10-CM | POA: Diagnosis not present

## 2017-04-21 DIAGNOSIS — Z6827 Body mass index (BMI) 27.0-27.9, adult: Secondary | ICD-10-CM | POA: Diagnosis not present

## 2017-04-21 DIAGNOSIS — I779 Disorder of arteries and arterioles, unspecified: Secondary | ICD-10-CM | POA: Diagnosis not present

## 2017-04-21 DIAGNOSIS — N183 Chronic kidney disease, stage 3 (moderate): Secondary | ICD-10-CM | POA: Diagnosis not present

## 2017-04-21 DIAGNOSIS — M519 Unspecified thoracic, thoracolumbar and lumbosacral intervertebral disc disorder: Secondary | ICD-10-CM | POA: Diagnosis not present

## 2017-04-21 DIAGNOSIS — R7309 Other abnormal glucose: Secondary | ICD-10-CM | POA: Diagnosis not present

## 2017-04-22 DIAGNOSIS — Z6827 Body mass index (BMI) 27.0-27.9, adult: Secondary | ICD-10-CM | POA: Diagnosis not present

## 2017-04-22 DIAGNOSIS — E663 Overweight: Secondary | ICD-10-CM | POA: Diagnosis not present

## 2017-04-22 DIAGNOSIS — Z125 Encounter for screening for malignant neoplasm of prostate: Secondary | ICD-10-CM | POA: Diagnosis not present

## 2017-04-22 DIAGNOSIS — Z1389 Encounter for screening for other disorder: Secondary | ICD-10-CM | POA: Diagnosis not present

## 2017-04-22 DIAGNOSIS — Z0000 Encounter for general adult medical examination without abnormal findings: Secondary | ICD-10-CM | POA: Diagnosis not present

## 2017-04-28 DIAGNOSIS — S32501D Unspecified fracture of right pubis, subsequent encounter for fracture with routine healing: Secondary | ICD-10-CM | POA: Diagnosis not present

## 2017-06-23 DIAGNOSIS — S32501D Unspecified fracture of right pubis, subsequent encounter for fracture with routine healing: Secondary | ICD-10-CM | POA: Diagnosis not present

## 2017-09-28 ENCOUNTER — Ambulatory Visit (HOSPITAL_COMMUNITY)
Admission: RE | Admit: 2017-09-28 | Discharge: 2017-09-28 | Disposition: A | Discharge status: Home / Self Care | Payer: Medicare Other | Source: Ambulatory Visit | Attending: Family | Admitting: Family

## 2017-09-28 ENCOUNTER — Ambulatory Visit (INDEPENDENT_AMBULATORY_CARE_PROVIDER_SITE_OTHER): Payer: Medicare Other | Admitting: Family

## 2017-09-28 ENCOUNTER — Encounter: Payer: Self-pay | Admitting: Family

## 2017-09-28 VITALS — BP 122/75 | HR 76 | Temp 97.4°F | Resp 20 | Ht 72.0 in | Wt 198.1 lb

## 2017-09-28 DIAGNOSIS — F17200 Nicotine dependence, unspecified, uncomplicated: Secondary | ICD-10-CM

## 2017-09-28 DIAGNOSIS — I714 Abdominal aortic aneurysm, without rupture, unspecified: Secondary | ICD-10-CM

## 2017-09-28 DIAGNOSIS — Z95828 Presence of other vascular implants and grafts: Secondary | ICD-10-CM

## 2017-09-28 NOTE — Progress Notes (Signed)
[facility]  CC: Follow up s/p Endovascular Repair of Abdominal Aortic Aneurysm    History of Present Illness  John Choi is a 67 y.o. ([birth year]) male who is s/p endovascular repair of an abdominal aortic aneurysm on 07/20/2014 by Dr. Trula Slade. His postoperative course was uncomplicated and he was discharged to home on postoperative day 1. He returns today for routine follow up.  08/27/14 CT angiogram showed a decrease in maximum diameter of the aneurysm from 5.7 down to 5.4. There was no evidence of endoleak.  He had a ventral hernia repaired in August, 2016, states doing well after this.   In June 2018 he had a tractor accident, sustained pelvic and low back fractures. He denies any remaining back pain from this or other source.  He reports that he has lots of gas, has occasional mild abdominal pain in varying areas, states it might be due to gas.   Pt states he stays physically active.   Pt denies claudication symptoms with walking, denies non healing wounds. He denies any history of stroke or TIA.   He takes a daily 81 mg ASA, statin, and beta blocker.   Pt Diabetic: No Pt smoker: smoker (2 cigarettes/day, decreased from 2 ppd, started smoking at age 50 yrs), trying for 40 years to quit    Past Medical History:  Diagnosis Date  . Asthma   . CHF (congestive heart failure) ([place])   . Head trauma   . High cholesterol   . History of kidney stones   . Hypertension   . Peripheral vascular disease ([place])   . Pubic ramus fracture ([place]) 02/2000   left; thrown from tractor/notes 03/05/2017   Past Surgical History:  Procedure Laterality Date  . ABDOMINAL AORTIC ENDOVASCULAR STENT GRAFT N/A 07/20/2014   Procedure: ABDOMINAL AORTIC ENDOVASCULAR STENT GRAFT;  Surgeon: Serafina Mitchell, MD;  Location: [facility];  Service: Vascular;  Laterality: N/A;  . BACK SURGERY    . BREAST SURGERY    . CARDIAC CATHETERIZATION  05/2014  . COLONOSCOPY W/  POLYPECTOMY  1997  . CYSTOSCOPY W/ STONE MANIPULATION  1980's and 1990's X 4  . HERNIA REPAIR    . LAPAROSCOPIC ASSISTED VENTRAL HERNIA REPAIR     "right above my bellybutton"  . LEFT HEART CATHETERIZATION WITH CORONARY ANGIOGRAM N/A 06/11/2014   Procedure: LEFT HEART CATHETERIZATION WITH CORONARY ANGIOGRAM;  Surgeon: Blane Ohara, MD;  Location: [facility] CATH LAB;  Service: Cardiovascular;  Laterality: N/A;  . LUMBAR DISC SURGERY  X 2   "# 1, #2"  . LUMBAR SPINE HARDWARE REMOVAL     "#4"  . POSTERIOR LUMBAR FUSION     "#3"   Social History Social History   Tobacco Use  . Smoking status: Current Every Day Smoker    Packs/day: 1.00    Years: 50.00    Pack years: 50.00    Types: Cigarettes  . Smokeless tobacco: Never Used  Substance Use Topics  . Alcohol use: No  . Drug use: No   Family History Family History  Problem Relation Age of Onset  . Heart disease Mother 29       Before age 75  . Heart attack Mother   . Heart attack Father   . Stroke Father   . Heart disease Father        Before age 48  . Cancer Sister        Brain  . Heart disease Sister  Before age 30  . Diabetes Sister   . Cancer Brother        Throat, Liver, Pancreatic  . Stroke Brother   . Diabetes Brother    Current Outpatient Medications on File Prior to Visit  Medication Sig Dispense Refill  . albuterol (PROVENTIL HFA;VENTOLIN HFA) 108 (90 BASE) MCG/ACT inhaler Inhale 2 puffs into the lungs every 6 (six) hours as needed for wheezing or shortness of breath.    Marland Kitchen amLODipine (NORVASC) 10 MG tablet Take 1 tablet (10 mg total) by mouth at bedtime. 90 tablet 3  . aspirin EC 81 MG tablet Take 81 mg by mouth at bedtime.     . metoprolol succinate (TOPROL-XL) 25 MG 24 hr tablet Take 0.5 tablets (12.5 mg total) by mouth daily. (Patient taking differently: Take 12.5 mg by mouth at bedtime. ) 45 tablet 3  . pantoprazole (PROTONIX) 40 MG tablet Take 1 tablet (40 mg total) by mouth daily. 30 tablet 0  .  simvastatin (ZOCOR) 20 MG tablet TAKE 1 TABLET(20 MG) BY MOUTH AT BEDTIME 90 tablet 3  . acetaminophen (TYLENOL) 325 MG tablet Take 1 tablet (325 mg total) by mouth every 6 (six) hours as needed for mild pain. (Patient not taking: Reported on 09/28/2017)    . docusate sodium (COLACE) 100 MG capsule Take 1 capsule (100 mg total) by mouth 2 (two) times daily. (Patient not taking: Reported on 09/28/2017) 10 capsule 0  . HYDROcodone-acetaminophen (NORCO/VICODIN) 5-325 MG tablet Take 1-2 tablets by mouth every 4 (four) hours as needed for moderate pain. (Patient not taking: Reported on 09/28/2017) 30 tablet 0  . methocarbamol (ROBAXIN) 750 MG tablet Take 2 tablets (1,500 mg total) by mouth every 6 (six) hours as needed for muscle spasms. (Patient not taking: Reported on 09/28/2017) 40 tablet 0  . polyethylene glycol (MIRALAX / GLYCOLAX) packet Take 17 g by mouth daily as needed. (Patient not taking: Reported on 09/28/2017) 14 each 0   Current Facility-Administered Medications on File Prior to Visit  Medication Dose Route Frequency Provider Last Rate Last Dose  . diazepam (VALIUM) tablet 5 mg  5 mg Oral Once [name], MD       No Known Allergies   ROS: See HPI for pertinent positives and negatives.  Physical Examination  Vitals:   09/28/17 0829  BP: 122/75  Pulse: 76  Resp: 20  Temp: (!) 97.4 F (36.3 C)  TempSrc: Oral  SpO2: 96%  Weight: 198 lb 1.6 oz (89.9 kg)  Height: 6' (1.829 m)   Body mass index is 26.87 kg/m.  General: A&O x 3, WD.  Pulmonary: Sym exp, respirations are non labored, fair air movt, CTAB, no rales, rhonchi, or wheezing.   Cardiac: RRR, Nl S1, S2, no murmur appreciated  Vascular: Vessel Right Left  Radial 2+Palpable 2+Palpable  Carotid palpable without bruit Palpable without bruit  Aorta Not palpable N/A  Femoral 2+Palpable 2+Palpable  Popliteal Not palpable Not palpable  PT 2+Palpable 2+Palpable  DP 2+Palpable 2+Palpable    Gastrointestinal: mildly distended, mild generalized tenderness, -G/R, - HSM, large (about 9 cm x 12 cm) reducible ventral hernia, - CVAT B.  Musculoskeletal: M/S 5/5 throughout, Extremities without ischemic changes.  Skin: No rash, no cellulitis, no ulcers noted.   Neurologic: Pain and light touch intact in extremities, Motor exam as listed above.  Psychiatric: Mood appropriate for clinical situation.       DATA  EVAR Duplex (Date: 09/28/17):  AAA sac size: 4.05 cm; bilateral common iliac  arteries not visualized.  Limited visualization of the abdominal vasculature due to overlying bowel gas.   no endoleak detected  Previous (Date: 09-28-16): 3.86 cm; right CIA: 1.56 cm; left CIA: 1.59 cm    10-23- 2015 Carotid Duplex: <40% bilateral ICA stenosis. Bilateral vertebral artery flow is antegrade.  Bilateral subclavian artery waveforms are normal.  No prior carotid duplex performed at this facility.     Medical Decision Making  RONEY YOUTZ is a 67 y.o. male who presents s/p EVAR (Date: 07-20-2014).  Pt is asymptomatic with stable sac size, 4.05 cm today, based on limited visualization.  I discussed with the patient the importance of surveillance of the endograft.  The next endograft duplex will be scheduled for 12 months.  The patient will follow up with [place] in 12 months with these studies.  I emphasized the importance of maximal medical management including strict control of blood pressure, blood glucose, and lipid levels, antiplatelet agents, obtaining regular exercise, and cessation of smoking.   Thank you for allowing [place] to participate in this patient's care.  Clemon Chambers, RN, MSN, FNP-C [facility] Office: [phone]  Clinic Physician: Early  09/28/2017, 8:55 AM

## 2017-09-28 NOTE — Patient Instructions (Signed)
Before your next abdominal ultrasound:  Take two Extra-Strength Gas-X capsules at bedtime the night before the test. Take another two Extra-Strength Gas-X capsules 3 hours before the test.  Avoid gas forming foods the day before the test.        Steps to Quit Smoking Smoking tobacco can be bad for your health. It can also affect almost every organ in your body. Smoking puts you and people around you at risk for many serious long-lasting (chronic) diseases. Quitting smoking is hard, but it is one of the best things that you can do for your health. It is never too late to quit. What are the benefits of quitting smoking? When you quit smoking, you lower your risk for getting serious diseases and conditions. They can include:  Lung cancer or lung disease.  Heart disease.  Stroke.  Heart attack.  Not being able to have children (infertility).  Weak bones (osteoporosis) and broken bones (fractures).  If you have coughing, wheezing, and shortness of breath, those symptoms may get better when you quit. You may also get sick less often. If you are pregnant, quitting smoking can help to lower your chances of having a baby of low birth weight. What can I do to help me quit smoking? Talk with your doctor about what can help you quit smoking. Some things you can do (strategies) include:  Quitting smoking totally, instead of slowly cutting back how much you smoke over a period of time.  Going to in-person counseling. You are more likely to quit if you go to many counseling sessions.  Using resources and support systems, such as: ? Online chats with a counselor. ? Phone quitlines. ? Printed self-help materials. ? Support groups or group counseling. ? Text messaging programs. ? Mobile phone apps or applications.  Taking medicines. Some of these medicines may have nicotine in them. If you are pregnant or breastfeeding, do not take any medicines to quit smoking unless your doctor says it is  okay. Talk with your doctor about counseling or other things that can help you.  Talk with your doctor about using more than one strategy at the same time, such as taking medicines while you are also going to in-person counseling. This can help make quitting easier. What things can I do to make it easier to quit? Quitting smoking might feel very hard at first, but there is a lot that you can do to make it easier. Take these steps:  Talk to your family and friends. Ask them to support and encourage you.  Call phone quitlines, reach out to support groups, or work with a counselor.  Ask people who smoke to not smoke around you.  Avoid places that make you want (trigger) to smoke, such as: ? Bars. ? Parties. ? Smoke-break areas at work.  Spend time with people who do not smoke.  Lower the stress in your life. Stress can make you want to smoke. Try these things to help your stress: ? Getting regular exercise. ? Deep-breathing exercises. ? Yoga. ? Meditating. ? Doing a body scan. To do this, close your eyes, focus on one area of your body at a time from head to toe, and notice which parts of your body are tense. Try to relax the muscles in those areas.  Download or buy apps on your mobile phone or tablet that can help you stick to your quit plan. There are many free apps, such as QuitGuide from the CDC (Centers for Disease Control   and Prevention). You can find more support from smokefree.gov and other websites.  This information is not intended to replace advice given to you by your health care provider. Make sure you discuss any questions you have with your health care provider. Document Released: 07/04/2009 Document Revised: 05/05/2016 Document Reviewed: 01/22/2015 Elsevier Interactive Patient Education  2018 Elsevier Inc.  

## 2017-09-29 NOTE — Addendum Note (Signed)
Addended by: Lianne Cure A on: 09/29/2017 12:00 PM   Modules accepted: Orders

## 2017-10-10 ENCOUNTER — Other Ambulatory Visit: Payer: Self-pay | Admitting: Cardiovascular Disease

## 2017-10-10 DIAGNOSIS — E785 Hyperlipidemia, unspecified: Secondary | ICD-10-CM

## 2017-10-11 ENCOUNTER — Other Ambulatory Visit: Payer: Self-pay | Admitting: Cardiovascular Disease

## 2017-10-11 DIAGNOSIS — E785 Hyperlipidemia, unspecified: Secondary | ICD-10-CM

## 2017-10-20 ENCOUNTER — Encounter: Payer: Self-pay | Admitting: Cardiovascular Disease

## 2017-10-20 ENCOUNTER — Ambulatory Visit (INDEPENDENT_AMBULATORY_CARE_PROVIDER_SITE_OTHER): Payer: Medicare Other | Admitting: Cardiovascular Disease

## 2017-10-20 VITALS — BP 150/70 | HR 80 | Ht 72.0 in | Wt 196.1 lb

## 2017-10-20 DIAGNOSIS — E782 Mixed hyperlipidemia: Secondary | ICD-10-CM

## 2017-10-20 DIAGNOSIS — E785 Hyperlipidemia, unspecified: Secondary | ICD-10-CM

## 2017-10-20 DIAGNOSIS — I10 Essential (primary) hypertension: Secondary | ICD-10-CM

## 2017-10-20 MED ORDER — ROSUVASTATIN CALCIUM 20 MG PO TABS: 20.0000 mg | ORAL_TABLET | Freq: Every day | ORAL | 3 refills | Status: DC

## 2017-10-20 MED ORDER — METOPROLOL SUCCINATE ER 25 MG PO TB24: 25.0000 mg | ORAL_TABLET | Freq: Every day | ORAL | 3 refills | Status: DC

## 2017-10-20 NOTE — Patient Instructions (Signed)
Medication Instructions:  1) STOP SIMVASTATIN 2) START CRESTOR 20 mg daily 3) INCREASE TOPROL to 25 nightly  Labwork: Please return for fasting blood work in 8 weeks. You may come any time between 7:30AM and 5:00PM as long as you are fasting (no food or drink after midnight except water and black coffee).  Testing/Procedures: None  Follow-Up: Your provider wants you to follow-up in: 1 year with Dr. Burt Knack. You will receive a reminder letter in the mail two months in advance. If you don't receive a letter, please call our office to schedule the follow-up appointment.    Any Other Special Instructions Will Be Listed Below (If Applicable).     If you need a refill on your cardiac medications before your next appointment, please call your pharmacy.

## 2017-10-20 NOTE — Progress Notes (Signed)
Cardiology Office Note Date:  10/20/2017   ID:  John Choi, DOB 1951/06/14, MRN [id number]  PCP:  Sharilyn Sites, MD  Cardiologist:  [name], MD    Chief Complaint  Patient presents with  . Follow-up  . Hypertension     History of Present Illness: John Choi is a 67 y.o. male who presents for follow-up of AAA and HTN. He is here alone today. Underwent EVAR in 2015. He had quit smoking but had a bad farm tractor accident last year with fractured pelvis and leg. He started back to smoking and is currently still smoking.   Today, he denies symptoms of palpitations, chest pain, shortness of breath, orthopnea, PND, lower extremity edema, dizziness, or syncope. He is here alone today. Reports a stressful drive to the appointment and attributes BP elevation to that.     Past Medical History:  Diagnosis Date  . Asthma   . CHF (congestive heart failure) ([place])   . Head trauma   . High cholesterol   . History of kidney stones   . Hypertension   . Peripheral vascular disease ([place])   . Pubic ramus fracture ([place]) 02/2000   left; thrown from tractor/notes 03/05/2017    Past Surgical History:  Procedure Laterality Date  . ABDOMINAL AORTIC ENDOVASCULAR STENT GRAFT N/A 07/20/2014   Procedure: ABDOMINAL AORTIC ENDOVASCULAR STENT GRAFT;  Surgeon: Serafina Mitchell, MD;  Location: [facility];  Service: Vascular;  Laterality: N/A;  . BACK SURGERY    . BREAST SURGERY    . CARDIAC CATHETERIZATION  05/2014  . COLONOSCOPY W/ POLYPECTOMY  1997  . CYSTOSCOPY W/ STONE MANIPULATION  1980's and 1990's X 4  . HERNIA REPAIR    . LAPAROSCOPIC ASSISTED VENTRAL HERNIA REPAIR     "right above my bellybutton"  . LEFT HEART CATHETERIZATION WITH CORONARY ANGIOGRAM N/A 06/11/2014   Procedure: LEFT HEART CATHETERIZATION WITH CORONARY ANGIOGRAM;  Surgeon: Blane Ohara, MD;  Location: [facility] CATH LAB;  Service: Cardiovascular;  Laterality: N/A;  . LUMBAR DISC SURGERY  X 2   "# 1, #2"  . LUMBAR SPINE  HARDWARE REMOVAL     "#4"  . POSTERIOR LUMBAR FUSION     "#3"    Current Outpatient Medications  Medication Sig Dispense Refill  . albuterol (PROVENTIL HFA;VENTOLIN HFA) 108 (90 BASE) MCG/ACT inhaler Inhale 2 puffs into the lungs every 6 (six) hours as needed for wheezing or shortness of breath.    Marland Kitchen amLODipine (NORVASC) 10 MG tablet Take 1 tablet (10 mg total) by mouth at bedtime. 90 tablet 3  . aspirin EC 81 MG tablet Take 81 mg by mouth at bedtime.     . metoprolol succinate (TOPROL-XL) 25 MG 24 hr tablet Take 0.5 tablets (12.5 mg total) by mouth daily. Please keep upcoming appointment for further refills 15 tablet 0  . simvastatin (ZOCOR) 20 MG tablet TAKE 1 TABLET(20 MG) BY MOUTH AT BEDTIME 90 tablet 3   Current Facility-Administered Medications  Medication Dose Route Frequency Provider Last Rate Last Dose  . diazepam (VALIUM) tablet 5 mg  5 mg Oral Once [name], MD        Allergies:   Patient has no known allergies.   Social History:  The patient  reports that he has been smoking cigarettes.  He has a 50.00 pack-year smoking history. he has never used smokeless tobacco. He reports that he does not drink alcohol or use drugs.   Family History:  The patient's family  history includes Cancer in his brother and sister; Diabetes in his brother and sister; Heart attack in his father and mother; Heart disease in his father and sister; Heart disease (age of onset: 39) in his mother; Stroke in his brother and father.    ROS:  Please see the history of present illness.  Otherwise, review of systems is positive for easy bruising.  All other systems are reviewed and negative.    PHYSICAL EXAM: VS:  BP (!) 150/70   Pulse 80   Ht 6' (1.829 m)   Wt 196 lb 1.9 oz (89 kg)   BMI 26.60 kg/m  , BMI Body mass index is 26.6 kg/m. GEN: Well nourished, well developed, in no acute distress  HEENT: normal  Neck: no JVD, no masses. No carotid bruits Cardiac: RRR without murmur or gallop      Respiratory:  clear to auscultation bilaterally, normal work of breathing GI: soft, nontender, nondistended, + BS MS: no deformity or atrophy  Ext: trace bilateral pretibial edema, pedal pulses 2+= bilaterally Skin: warm and dry, no rash Neuro:  Strength and sensation are intact Psych: euthymic mood, full affect  EKG:  EKG is ordered today. The ekg ordered today shows NSR 80 bpm, RBBB, LAFB, PAC's.  Recent Labs: 03/05/2017: ALT 18 03/06/2017: Hemoglobin 13.3; Platelets 224 03/09/2017: BUN 21; Creatinine, Ser 1.47; Potassium 4.2; Sodium 137   Lipid Panel     Component Value Date/Time   CHOL (H) 11/23/2009 0155    207        ATP III CLASSIFICATION:  <200     mg/dL   Desirable  200-239  mg/dL   Borderline High  >=240    mg/dL   High          TRIG 320 (H) 11/23/2009 0155   HDL 29 (L) 11/23/2009 0155   CHOLHDL 7.1 11/23/2009 0155   VLDL 64 (H) 11/23/2009 0155   LDLCALC (H) 11/23/2009 0155    114        Total Cholesterol/HDL:CHD Risk Coronary Heart Disease Risk Table                     Men   Women  1/2 Average Risk   3.4   3.3  Average Risk       5.0   4.4  2 X Average Risk   9.6   7.1  3 X Average Risk  23.4   11.0        Use the calculated Patient Ratio above and the CHD Risk Table to determine the patient's CHD Risk.        ATP III CLASSIFICATION (LDL):  <100     mg/dL   Optimal  100-129  mg/dL   Near or Above                    Optimal  130-159  mg/dL   Borderline  160-189  mg/dL   High  >190     mg/dL   Very High      Wt Readings from Last 3 Encounters:  10/20/17 196 lb 1.9 oz (89 kg)  09/28/17 198 lb 1.6 oz (89.9 kg)  03/04/17 192 lb (87.1 kg)      ASSESSMENT AND PLAN: 1.  HTN: BP control borderline. States lots of stress this morning, usually BP in 120's/70's per his report. Will increase Toprol XL to 25 mg at bedtime I think he will tolerate this. Continue amlodipine 10 mg daily.  2. Hyperlipidemia: having some funny side effects he attributes to  simvastatin (bad dreams, restlessness, fatigue). Lipids above goal last check. Recommend trial of crestor 20 mg daily, lipids/LFT's in 8 weeks.   3. AAA: followed by [facility].  4. Bifascicular block: normal heart rate and PR interval. Ok to increase metoprolol still at low dose of 25 mg daily.   Current medicines are reviewed with the patient today.  The patient does not have concerns regarding medicines.  Labs/ tests ordered today include:  No orders of the defined types were placed in this encounter.   Disposition:   FU one year  Signed, [name], MD  10/20/2017 10:43 AM    [facility] [address], [place], [place]  [address] Phone: [phone]; Fax: [phone]

## 2017-12-24 ENCOUNTER — Encounter (INDEPENDENT_AMBULATORY_CARE_PROVIDER_SITE_OTHER): Payer: Self-pay

## 2017-12-24 ENCOUNTER — Other Ambulatory Visit: Payer: Medicare Other | Admitting: *Deleted

## 2017-12-24 DIAGNOSIS — E785 Hyperlipidemia, unspecified: Secondary | ICD-10-CM

## 2017-12-24 LAB — HEPATIC FUNCTION PANEL
ALBUMIN: 4.2 g/dL (ref 3.6–4.8)
ALK PHOS: 125 IU/L — AB (ref 39–117)
ALT: 15 IU/L (ref 0–44)
AST: 16 IU/L (ref 0–40)
Bilirubin Total: 0.2 mg/dL (ref 0.0–1.2)
Bilirubin, Direct: 0.1 mg/dL (ref 0.00–0.40)
TOTAL PROTEIN: 7.3 g/dL (ref 6.0–8.5)

## 2017-12-24 LAB — LIPID PANEL
CHOL/HDL RATIO: 3.4 ratio (ref 0.0–5.0)
Cholesterol, Total: 127 mg/dL (ref 100–199)
HDL: 37 mg/dL — AB (ref >39)
LDL CALC: 65 mg/dL (ref 0–99)
TRIGLYCERIDES: 125 mg/dL (ref 0–149)
VLDL Cholesterol Cal: 25 mg/dL (ref 5–40)

## 2018-03-18 DIAGNOSIS — E782 Mixed hyperlipidemia: Secondary | ICD-10-CM | POA: Diagnosis not present

## 2018-03-18 DIAGNOSIS — F17290 Nicotine dependence, other tobacco product, uncomplicated: Secondary | ICD-10-CM | POA: Diagnosis not present

## 2018-03-18 DIAGNOSIS — Z6829 Body mass index (BMI) 29.0-29.9, adult: Secondary | ICD-10-CM | POA: Diagnosis not present

## 2018-03-18 DIAGNOSIS — Z719 Counseling, unspecified: Secondary | ICD-10-CM | POA: Diagnosis not present

## 2018-03-18 DIAGNOSIS — Z1389 Encounter for screening for other disorder: Secondary | ICD-10-CM | POA: Diagnosis not present

## 2018-03-18 DIAGNOSIS — E663 Overweight: Secondary | ICD-10-CM | POA: Diagnosis not present

## 2018-03-18 DIAGNOSIS — I10 Essential (primary) hypertension: Secondary | ICD-10-CM | POA: Diagnosis not present

## 2018-03-18 DIAGNOSIS — N183 Chronic kidney disease, stage 3 (moderate): Secondary | ICD-10-CM | POA: Diagnosis not present

## 2018-03-18 DIAGNOSIS — I779 Disorder of arteries and arterioles, unspecified: Secondary | ICD-10-CM | POA: Diagnosis not present

## 2018-03-22 DIAGNOSIS — R7309 Other abnormal glucose: Secondary | ICD-10-CM | POA: Diagnosis not present

## 2018-06-07 DIAGNOSIS — J019 Acute sinusitis, unspecified: Secondary | ICD-10-CM | POA: Diagnosis not present

## 2018-06-07 DIAGNOSIS — R0981 Nasal congestion: Secondary | ICD-10-CM | POA: Diagnosis not present

## 2018-06-07 DIAGNOSIS — E663 Overweight: Secondary | ICD-10-CM | POA: Diagnosis not present

## 2018-06-07 DIAGNOSIS — R509 Fever, unspecified: Secondary | ICD-10-CM | POA: Diagnosis not present

## 2018-06-07 DIAGNOSIS — Z1389 Encounter for screening for other disorder: Secondary | ICD-10-CM | POA: Diagnosis not present

## 2018-06-07 DIAGNOSIS — R05 Cough: Secondary | ICD-10-CM | POA: Diagnosis not present

## 2018-06-07 DIAGNOSIS — Z6828 Body mass index (BMI) 28.0-28.9, adult: Secondary | ICD-10-CM | POA: Diagnosis not present

## 2018-08-30 DIAGNOSIS — K4090 Unilateral inguinal hernia, without obstruction or gangrene, not specified as recurrent: Secondary | ICD-10-CM | POA: Diagnosis not present

## 2018-08-30 DIAGNOSIS — Z1389 Encounter for screening for other disorder: Secondary | ICD-10-CM | POA: Diagnosis not present

## 2018-08-30 DIAGNOSIS — Z0001 Encounter for general adult medical examination with abnormal findings: Secondary | ICD-10-CM | POA: Diagnosis not present

## 2018-10-03 DIAGNOSIS — R1031 Right lower quadrant pain: Secondary | ICD-10-CM | POA: Diagnosis not present

## 2018-10-07 ENCOUNTER — Other Ambulatory Visit (HOSPITAL_COMMUNITY): Payer: Self-pay | Admitting: General Surgery

## 2018-10-07 ENCOUNTER — Other Ambulatory Visit: Payer: Self-pay | Admitting: General Surgery

## 2018-10-07 DIAGNOSIS — R1909 Other intra-abdominal and pelvic swelling, mass and lump: Secondary | ICD-10-CM

## 2018-10-10 ENCOUNTER — Other Ambulatory Visit: Payer: Self-pay | Admitting: Cardiovascular Disease
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
## 2018-10-13 ENCOUNTER — Other Ambulatory Visit: Payer: Self-pay

## 2018-10-13 ENCOUNTER — Ambulatory Visit (HOSPITAL_COMMUNITY)
Admission: RE | Admit: 2018-10-13 | Discharge: 2018-10-13 | Disposition: A | Discharge status: Home / Self Care | Payer: Medicare Other | Source: Ambulatory Visit | Attending: General Surgery | Admitting: General Surgery

## 2018-10-13 DIAGNOSIS — K4090 Unilateral inguinal hernia, without obstruction or gangrene, not specified as recurrent: Secondary | ICD-10-CM | POA: Diagnosis not present

## 2018-10-13 DIAGNOSIS — R1909 Other intra-abdominal and pelvic swelling, mass and lump: Secondary | ICD-10-CM | POA: Insufficient documentation

## 2018-10-14 ENCOUNTER — Ambulatory Visit (INDEPENDENT_AMBULATORY_CARE_PROVIDER_SITE_OTHER): Payer: Medicare Other | Admitting: Physician Assistant

## 2018-10-14 ENCOUNTER — Ambulatory Visit (HOSPITAL_COMMUNITY)
Admission: RE | Admit: 2018-10-14 | Discharge: 2018-10-14 | Disposition: A | Discharge status: Home / Self Care | Payer: Medicare Other | Source: Ambulatory Visit | Attending: Family | Admitting: Family

## 2018-10-14 ENCOUNTER — Other Ambulatory Visit: Payer: Self-pay

## 2018-10-14 ENCOUNTER — Encounter: Payer: Self-pay | Admitting: Family

## 2018-10-14 VITALS — BP 120/77 | HR 70 | Temp 97.8°F | Resp 20 | Ht 72.0 in | Wt 198.0 lb

## 2018-10-14 DIAGNOSIS — Z95828 Presence of other vascular implants and grafts: Secondary | ICD-10-CM

## 2018-10-14 DIAGNOSIS — I714 Abdominal aortic aneurysm, without rupture, unspecified: Secondary | ICD-10-CM

## 2018-10-14 DIAGNOSIS — F17200 Nicotine dependence, unspecified, uncomplicated: Secondary | ICD-10-CM | POA: Diagnosis not present

## 2018-11-02 DIAGNOSIS — Z6828 Body mass index (BMI) 28.0-28.9, adult: Secondary | ICD-10-CM | POA: Diagnosis not present

## 2018-11-02 DIAGNOSIS — Z719 Counseling, unspecified: Secondary | ICD-10-CM | POA: Diagnosis not present

## 2018-11-02 DIAGNOSIS — K4090 Unilateral inguinal hernia, without obstruction or gangrene, not specified as recurrent: Secondary | ICD-10-CM | POA: Diagnosis not present

## 2018-11-02 DIAGNOSIS — E663 Overweight: Secondary | ICD-10-CM | POA: Diagnosis not present

## 2018-11-02 DIAGNOSIS — D179 Benign lipomatous neoplasm, unspecified: Secondary | ICD-10-CM | POA: Diagnosis not present

## 2018-11-09 ENCOUNTER — Other Ambulatory Visit: Payer: Self-pay | Admitting: Cardiovascular Disease

## 2018-11-22 ENCOUNTER — Other Ambulatory Visit: Payer: Self-pay | Admitting: *Deleted

## 2018-11-22 ENCOUNTER — Ambulatory Visit (INDEPENDENT_AMBULATORY_CARE_PROVIDER_SITE_OTHER): Payer: Medicare Other | Admitting: Physician Assistant

## 2018-11-22 ENCOUNTER — Encounter: Payer: Self-pay | Admitting: Physician Assistant

## 2018-11-22 VITALS — BP 132/80 | HR 84 | Ht 72.0 in | Wt 203.8 lb

## 2018-11-22 DIAGNOSIS — F17200 Nicotine dependence, unspecified, uncomplicated: Secondary | ICD-10-CM

## 2018-11-22 DIAGNOSIS — E782 Mixed hyperlipidemia: Secondary | ICD-10-CM

## 2018-11-22 DIAGNOSIS — I10 Essential (primary) hypertension: Secondary | ICD-10-CM

## 2018-11-22 DIAGNOSIS — I714 Abdominal aortic aneurysm, without rupture, unspecified: Secondary | ICD-10-CM

## 2018-11-22 NOTE — Patient Instructions (Signed)
Medication Instructions:  Your physician recommends that you continue on your current medications as directed. Please refer to the Current Medication list given to you today.  If you need a refill on your cardiac medications before your next appointment, please call your pharmacy.   Lab work: None ordered  If you have labs (blood work) drawn today and your tests are completely normal, you will receive your results only by: Marland Kitchen MyChart Message (if you have MyChart) OR . A paper copy in the mail If you have any lab test that is abnormal or we need to change your treatment, we will call you to review the results.  Testing/Procedures: None orderd  Follow-Up: At [facility], you and your health needs are our priority.  As part of our continuing mission to provide you with exceptional heart care, we have created designated Provider Care Teams.  These Care Teams include your primary Cardiologist (physician) and Advanced Practice Providers (APPs -  Physician Assistants and Nurse Practitioners) who all work together to provide you with the care you need, when you need it. You will need a follow up appointment in:  12 months.  Please call our office 2 months in advance to schedule this appointment.  You may see [name], MD or one of the following Advanced Practice Providers on your designated Care Team: Richardson Dopp, PA-C [place], [place] . Daune Perch, NP  Any Other Special Instructions Will Be Listed Below (If Applicable).
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
## 2018-12-09 ENCOUNTER — Other Ambulatory Visit: Payer: Self-pay | Admitting: Cardiovascular Disease

## 2018-12-15 DIAGNOSIS — E663 Overweight: Secondary | ICD-10-CM | POA: Diagnosis not present

## 2018-12-15 DIAGNOSIS — L02425 Furuncle of right lower limb: Secondary | ICD-10-CM | POA: Diagnosis not present

## 2018-12-15 DIAGNOSIS — Z6828 Body mass index (BMI) 28.0-28.9, adult: Secondary | ICD-10-CM | POA: Diagnosis not present

## 2019-01-27 DIAGNOSIS — E7849 Other hyperlipidemia: Secondary | ICD-10-CM | POA: Diagnosis not present

## 2019-01-27 DIAGNOSIS — R042 Hemoptysis: Secondary | ICD-10-CM | POA: Diagnosis not present

## 2019-01-27 DIAGNOSIS — N183 Chronic kidney disease, stage 3 (moderate): Secondary | ICD-10-CM | POA: Diagnosis not present

## 2019-01-27 DIAGNOSIS — R7309 Other abnormal glucose: Secondary | ICD-10-CM | POA: Diagnosis not present

## 2019-01-27 DIAGNOSIS — I10 Essential (primary) hypertension: Secondary | ICD-10-CM | POA: Diagnosis not present

## 2019-01-27 DIAGNOSIS — R739 Hyperglycemia, unspecified: Secondary | ICD-10-CM | POA: Diagnosis not present

## 2019-01-27 DIAGNOSIS — J069 Acute upper respiratory infection, unspecified: Secondary | ICD-10-CM | POA: Diagnosis not present

## 2019-01-27 DIAGNOSIS — Z125 Encounter for screening for malignant neoplasm of prostate: Secondary | ICD-10-CM | POA: Diagnosis not present

## 2019-01-27 DIAGNOSIS — Z6828 Body mass index (BMI) 28.0-28.9, adult: Secondary | ICD-10-CM | POA: Diagnosis not present

## 2019-01-27 DIAGNOSIS — Z719 Counseling, unspecified: Secondary | ICD-10-CM | POA: Diagnosis not present

## 2019-03-14 ENCOUNTER — Other Ambulatory Visit: Payer: Self-pay | Admitting: Cardiovascular Disease

## 2019-03-14 DIAGNOSIS — H2513 Age-related nuclear cataract, bilateral: Secondary | ICD-10-CM | POA: Diagnosis not present

## 2019-03-14 DIAGNOSIS — E785 Hyperlipidemia, unspecified: Secondary | ICD-10-CM

## 2019-03-14 DIAGNOSIS — H524 Presbyopia: Secondary | ICD-10-CM | POA: Diagnosis not present

## 2019-03-14 MED ORDER — METOPROLOL SUCCINATE ER 25 MG PO TB24: 25.0000 mg | ORAL_TABLET | Freq: Every day | ORAL | 2 refills | Status: DC

## 2019-06-15 DIAGNOSIS — J22 Unspecified acute lower respiratory infection: Secondary | ICD-10-CM | POA: Diagnosis not present

## 2019-06-15 DIAGNOSIS — R0602 Shortness of breath: Secondary | ICD-10-CM | POA: Diagnosis not present

## 2019-06-15 DIAGNOSIS — Z6828 Body mass index (BMI) 28.0-28.9, adult: Secondary | ICD-10-CM | POA: Diagnosis not present

## 2019-06-15 DIAGNOSIS — E663 Overweight: Secondary | ICD-10-CM | POA: Diagnosis not present

## 2019-07-19 DIAGNOSIS — Z23 Encounter for immunization: Secondary | ICD-10-CM | POA: Diagnosis not present

## 2019-07-19 DIAGNOSIS — Z1389 Encounter for screening for other disorder: Secondary | ICD-10-CM | POA: Diagnosis not present

## 2019-07-19 DIAGNOSIS — R112 Nausea with vomiting, unspecified: Secondary | ICD-10-CM | POA: Diagnosis not present

## 2019-07-19 DIAGNOSIS — Z6828 Body mass index (BMI) 28.0-28.9, adult: Secondary | ICD-10-CM | POA: Diagnosis not present

## 2019-07-19 DIAGNOSIS — K920 Hematemesis: Secondary | ICD-10-CM | POA: Diagnosis not present

## 2019-07-19 DIAGNOSIS — R1319 Other dysphagia: Secondary | ICD-10-CM | POA: Diagnosis not present

## 2019-07-25 ENCOUNTER — Other Ambulatory Visit: Payer: Self-pay | Admitting: Family Medicine

## 2019-07-25 DIAGNOSIS — Z122 Encounter for screening for malignant neoplasm of respiratory organs: Secondary | ICD-10-CM

## 2019-08-14 ENCOUNTER — Other Ambulatory Visit: Payer: Self-pay

## 2019-08-14 ENCOUNTER — Ambulatory Visit
Admission: RE | Admit: 2019-08-14 | Discharge: 2019-08-14 | Disposition: A | Discharge status: Home / Self Care | Payer: Medicare Other | Source: Ambulatory Visit | Attending: Family Medicine | Admitting: Family Medicine

## 2019-08-14 DIAGNOSIS — F17210 Nicotine dependence, cigarettes, uncomplicated: Secondary | ICD-10-CM | POA: Diagnosis not present

## 2019-08-14 DIAGNOSIS — Z122 Encounter for screening for malignant neoplasm of respiratory organs: Secondary | ICD-10-CM

## 2019-10-03 ENCOUNTER — Encounter (INDEPENDENT_AMBULATORY_CARE_PROVIDER_SITE_OTHER): Payer: Self-pay | Admitting: Gastroenterology

## 2019-10-16 DIAGNOSIS — Z23 Encounter for immunization: Secondary | ICD-10-CM | POA: Diagnosis not present

## 2019-11-08 ENCOUNTER — Ambulatory Visit (INDEPENDENT_AMBULATORY_CARE_PROVIDER_SITE_OTHER): Payer: Medicare Other | Admitting: Gastroenterology

## 2019-11-15 DIAGNOSIS — Z23 Encounter for immunization: Secondary | ICD-10-CM | POA: Diagnosis not present

## 2019-11-21 ENCOUNTER — Other Ambulatory Visit: Payer: Self-pay | Admitting: *Deleted

## 2019-11-21 DIAGNOSIS — Z95828 Presence of other vascular implants and grafts: Secondary | ICD-10-CM
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
## 2019-11-22 ENCOUNTER — Encounter: Payer: Self-pay | Admitting: Physician Assistant

## 2019-11-22 ENCOUNTER — Other Ambulatory Visit: Payer: Self-pay

## 2019-11-22 ENCOUNTER — Ambulatory Visit (INDEPENDENT_AMBULATORY_CARE_PROVIDER_SITE_OTHER): Payer: Medicare Other | Admitting: Physician Assistant

## 2019-11-22 ENCOUNTER — Telehealth (HOSPITAL_COMMUNITY): Payer: Self-pay

## 2019-11-22 VITALS — BP 134/82 | HR 83 | Ht 72.0 in | Wt 196.0 lb

## 2019-11-22 DIAGNOSIS — R072 Precordial pain: Secondary | ICD-10-CM | POA: Diagnosis not present

## 2019-11-22 DIAGNOSIS — I714 Abdominal aortic aneurysm, without rupture, unspecified: Secondary | ICD-10-CM

## 2019-11-22 DIAGNOSIS — I10 Essential (primary) hypertension: Secondary | ICD-10-CM | POA: Diagnosis not present

## 2019-11-22 DIAGNOSIS — E782 Mixed hyperlipidemia: Secondary | ICD-10-CM | POA: Diagnosis not present

## 2019-11-22 NOTE — Telephone Encounter (Signed)

## 2019-11-22 NOTE — Patient Instructions (Signed)
Follow-Up: At [facility], you and your health needs are our priority.  As part of our continuing mission to provide you with exceptional heart care, we have created designated Provider Care Teams.  These Care Teams include your primary Cardiologist (physician) and Advanced Practice Providers (APPs -  Physician Assistants and Nurse Practitioners) who all work together to provide you with the care you need, when you need it.  We recommend signing up for the patient portal called "MyChart".  Sign up information is provided on this After Visit Summary.  MyChart is used to connect with patients for Virtual Visits (Telemedicine).  Patients are able to view lab/test results, encounter notes, upcoming appointments, etc.  Non-urgent messages can be sent to your provider as well.   To learn more about what you can do with MyChart, go to [email or web address].    Your next appointment:   3 month(s)  The format for your next appointment:   Virtual Visit   Provider:   Richardson Dopp, PA-C   Other Instructions  Try Pepcid or Prilosec for 1 week, then as needed.  You can get one of these from over the counter. Work on diet, exercise and weight loss to help your blood pressure  Check your blood pressure 3-4 times a week and record.  We can discuss these at your next visit.  Call if you continue to have chest pain despite taking Pepcid or Prilosec.  We can arrange a stress test at that point.   Low-Sodium Eating Plan Sodium, which is an element that makes up salt, helps you maintain a healthy balance of fluids in your body. Too much sodium can increase your blood pressure and cause fluid and waste to be held in your body. Your health care provider or dietitian may recommend following this plan if you have high blood pressure (hypertension), kidney disease, liver disease, or heart failure. Eating less sodium can help lower your blood pressure, reduce swelling, and protect your heart, liver, and  kidneys. What are tips for following this plan? General guidelines  Most people on this plan should limit their sodium intake to 1,500-2,000 mg (milligrams) of sodium each day. Reading food labels   The Nutrition Facts label lists the amount of sodium in one serving of the food. If you eat more than one serving, you must multiply the listed amount of sodium by the number of servings.  Choose foods with less than 140 mg of sodium per serving.  Avoid foods with 300 mg of sodium or more per serving. Shopping  Look for lower-sodium products, often labeled as "low-sodium" or "no salt added."  Always check the sodium content even if foods are labeled as "unsalted" or "no salt added".  Buy fresh foods. ? Avoid canned foods and premade or frozen meals. ? Avoid canned, cured, or processed meats  Buy breads that have less than 80 mg of sodium per slice. Cooking  Eat more home-cooked food and less restaurant, buffet, and fast food.  Avoid adding salt when cooking. Use salt-free seasonings or herbs instead of table salt or sea salt. Check with your health care provider or pharmacist before using salt substitutes.  Cook with plant-based oils, such as canola, sunflower, or olive oil. Meal planning  When eating at a restaurant, ask that your food be prepared with less salt or no salt, if possible.  Avoid foods that contain MSG (monosodium glutamate). MSG is sometimes added to [place] food, bouillon, and some canned foods. What foods are recommended? The  items listed may not be a complete list. Talk with your dietitian about what dietary choices are best for you. Grains Low-sodium cereals, including oats, puffed wheat and rice, and shredded wheat. Low-sodium crackers. Unsalted rice. Unsalted pasta. Low-sodium bread. Whole-grain breads and whole-grain pasta. Vegetables Fresh or frozen vegetables. "No salt added" canned vegetables. "No salt added" tomato sauce and paste. Low-sodium or  reduced-sodium tomato and vegetable juice. Fruits Fresh, frozen, or canned fruit. Fruit juice. Meats and other protein foods Fresh or frozen (no salt added) meat, poultry, seafood, and fish. Low-sodium canned tuna and salmon. Unsalted nuts. Dried peas, beans, and lentils without added salt. Unsalted canned beans. Eggs. Unsalted nut butters. Dairy Milk. Soy milk. Cheese that is naturally low in sodium, such as ricotta cheese, fresh mozzarella, or Swiss cheese Low-sodium or reduced-sodium cheese. Cream cheese. Yogurt. Fats and oils Unsalted butter. Unsalted margarine with no trans fat. Vegetable oils such as canola or olive oils. Seasonings and other foods Fresh and dried herbs and spices. Salt-free seasonings. Low-sodium mustard and ketchup. Sodium-free salad dressing. Sodium-free light mayonnaise. Fresh or refrigerated horseradish. Lemon juice. Vinegar. Homemade, reduced-sodium, or low-sodium soups. Unsalted popcorn and pretzels. Low-salt or salt-free chips. What foods are not recommended? The items listed may not be a complete list. Talk with your dietitian about what dietary choices are best for you. Grains Instant hot cereals. Bread stuffing, pancake, and biscuit mixes. Croutons. Seasoned rice or pasta mixes. Noodle soup cups. Boxed or frozen macaroni and cheese. Regular salted crackers. Self-rising flour. Vegetables Sauerkraut, pickled vegetables, and relishes. Olives. [place] fries. Onion rings. Regular canned vegetables (not low-sodium or reduced-sodium). Regular canned tomato sauce and paste (not low-sodium or reduced-sodium). Regular tomato and vegetable juice (not low-sodium or reduced-sodium). Frozen vegetables in sauces. Meats and other protein foods Meat or fish that is salted, canned, smoked, spiced, or pickled. Bacon, ham, sausage, hotdogs, corned beef, chipped beef, packaged lunch meats, salt pork, jerky, pickled herring, anchovies, regular canned tuna, sardines, salted  nuts. Dairy Processed cheese and cheese spreads. Cheese curds. Blue cheese. Feta cheese. String cheese. Regular cottage cheese. Buttermilk. Canned milk. Fats and oils Salted butter. Regular margarine. Ghee. Bacon fat. Seasonings and other foods Onion salt, garlic salt, seasoned salt, table salt, and sea salt. Canned and packaged gravies. Worcestershire sauce. Tartar sauce. Barbecue sauce. Teriyaki sauce. Soy sauce, including reduced-sodium. Steak sauce. Fish sauce. Oyster sauce. Cocktail sauce. Horseradish that you find on the shelf. Regular ketchup and mustard. Meat flavorings and tenderizers. Bouillon cubes. Hot sauce and Tabasco sauce. Premade or packaged marinades. Premade or packaged taco seasonings. Relishes. Regular salad dressings. Salsa. Potato and tortilla chips. Corn chips and puffs. Salted popcorn and pretzels. Canned or dried soups. Pizza. Frozen entrees and pot pies. Summary  Eating less sodium can help lower your blood pressure, reduce swelling, and protect your heart, liver, and kidneys.  Most people on this plan should limit their sodium intake to 1,500-2,000 mg (milligrams) of sodium each day.  Canned, boxed, and frozen foods are high in sodium. Restaurant foods, fast foods, and pizza are also very high in sodium. You also get sodium by adding salt to food.  Try to cook at home, eat more fresh fruits and vegetables, and eat less fast food, canned, processed, or prepared foods. This information is not intended to replace advice given to you by your health care provider. Make sure you discuss any questions you have with your health care provider. Document Revised: 08/20/2017 Document Reviewed: 08/31/2016 Elsevier Patient Education  [phone]  [employer].
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
## 2019-11-24 ENCOUNTER — Ambulatory Visit (HOSPITAL_COMMUNITY)
Admission: RE | Admit: 2019-11-24 | Discharge: 2019-11-24 | Disposition: A | Discharge status: Home / Self Care | Payer: Medicare Other | Source: Ambulatory Visit | Attending: Surgery | Admitting: Surgery

## 2019-11-24 ENCOUNTER — Ambulatory Visit (INDEPENDENT_AMBULATORY_CARE_PROVIDER_SITE_OTHER): Payer: Medicare Other | Admitting: Physician Assistant

## 2019-11-24 ENCOUNTER — Other Ambulatory Visit: Payer: Self-pay

## 2019-11-24 VITALS — BP 137/83 | HR 72 | Temp 98.4°F | Resp 18 | Ht 72.0 in | Wt 195.0 lb

## 2019-11-24 DIAGNOSIS — Z95828 Presence of other vascular implants and grafts: Secondary | ICD-10-CM | POA: Diagnosis not present

## 2019-11-27 ENCOUNTER — Other Ambulatory Visit: Payer: Self-pay | Admitting: *Deleted

## 2019-11-27 DIAGNOSIS — Z95828 Presence of other vascular implants and grafts: Secondary | ICD-10-CM

## 2019-12-12 ENCOUNTER — Other Ambulatory Visit: Payer: Self-pay | Admitting: Cardiovascular Disease

## 2019-12-14 DIAGNOSIS — E7849 Other hyperlipidemia: Secondary | ICD-10-CM | POA: Diagnosis not present

## 2019-12-14 DIAGNOSIS — R7309 Other abnormal glucose: Secondary | ICD-10-CM | POA: Diagnosis not present

## 2019-12-14 DIAGNOSIS — Z1389 Encounter for screening for other disorder: Secondary | ICD-10-CM | POA: Diagnosis not present

## 2019-12-14 DIAGNOSIS — Z0000 Encounter for general adult medical examination without abnormal findings: Secondary | ICD-10-CM | POA: Diagnosis not present

## 2019-12-14 DIAGNOSIS — Z6828 Body mass index (BMI) 28.0-28.9, adult: Secondary | ICD-10-CM | POA: Diagnosis not present

## 2019-12-14 DIAGNOSIS — F17290 Nicotine dependence, other tobacco product, uncomplicated: Secondary | ICD-10-CM | POA: Diagnosis not present

## 2019-12-14 DIAGNOSIS — E663 Overweight: Secondary | ICD-10-CM | POA: Diagnosis not present

## 2019-12-14 DIAGNOSIS — I10 Essential (primary) hypertension: Secondary | ICD-10-CM | POA: Diagnosis not present

## 2020-04-11 DIAGNOSIS — D225 Melanocytic nevi of trunk: Secondary | ICD-10-CM | POA: Diagnosis not present

## 2020-04-11 DIAGNOSIS — C44622 Squamous cell carcinoma of skin of right upper limb, including shoulder: Secondary | ICD-10-CM | POA: Diagnosis not present

## 2020-04-11 DIAGNOSIS — D485 Neoplasm of uncertain behavior of skin: Secondary | ICD-10-CM | POA: Diagnosis not present

## 2020-04-11 DIAGNOSIS — L820 Inflamed seborrheic keratosis: Secondary | ICD-10-CM | POA: Diagnosis not present

## 2020-05-03 ENCOUNTER — Other Ambulatory Visit: Payer: Self-pay | Admitting: Cardiovascular Disease

## 2020-05-03 DIAGNOSIS — E785 Hyperlipidemia, unspecified: Secondary | ICD-10-CM

## 2020-08-05 DIAGNOSIS — Z23 Encounter for immunization: Secondary | ICD-10-CM | POA: Diagnosis not present

## 2020-09-06 DIAGNOSIS — W5501XA Bitten by cat, initial encounter: Secondary | ICD-10-CM | POA: Diagnosis not present

## 2020-09-06 DIAGNOSIS — S60871A Other superficial bite of right wrist, initial encounter: Secondary | ICD-10-CM | POA: Diagnosis not present

## 2020-09-06 DIAGNOSIS — E663 Overweight: Secondary | ICD-10-CM | POA: Diagnosis not present

## 2020-09-06 DIAGNOSIS — Z6828 Body mass index (BMI) 28.0-28.9, adult: Secondary | ICD-10-CM | POA: Diagnosis not present

## 2020-10-28 ENCOUNTER — Ambulatory Visit (INDEPENDENT_AMBULATORY_CARE_PROVIDER_SITE_OTHER): Payer: Medicare Other | Admitting: Physician Assistant

## 2020-10-28 ENCOUNTER — Ambulatory Visit (HOSPITAL_COMMUNITY)
Admission: RE | Admit: 2020-10-28 | Discharge: 2020-10-28 | Disposition: A | Discharge status: Home / Self Care | Payer: Medicare Other | Source: Ambulatory Visit | Attending: Physician Assistant | Admitting: Physician Assistant

## 2020-10-28 ENCOUNTER — Other Ambulatory Visit: Payer: Self-pay

## 2020-10-28 VITALS — BP 139/81 | HR 75 | Temp 98.6°F | Resp 20 | Ht 72.0 in | Wt 197.2 lb

## 2020-10-28 DIAGNOSIS — Z95828 Presence of other vascular implants and grafts: Secondary | ICD-10-CM | POA: Diagnosis not present

## 2020-10-28 DIAGNOSIS — K429 Umbilical hernia without obstruction or gangrene: Secondary | ICD-10-CM

## 2020-10-28 NOTE — Progress Notes (Addendum)
Established EVAR   History of Present Illness   John Choi is a 70 y.o. (1951-05-11) male who presents for routine follow up s/p EVAR (Date: 07/20/14).  He denies any new or changing abdominal or back pain.  He also denies any claudication, nonhealing wounds, or rest pain of bilateral lower extremities.  He still smokes cigarettes occasionally.  He is taking an aspirin and statin daily.  Patient also states he noticed a lump around his umbilicus where he previously had a ventral hernia repair by Dr. Rosendo Gros about 4 years ago.  He states it does not bother him however it is tender to touch especially when having his annual EVAR duplex.  He does not have any interest in following up with his surgeon currently.   Current Outpatient Medications  Medication Sig Dispense Refill  . albuterol (PROVENTIL HFA;VENTOLIN HFA) 108 (90 BASE) MCG/ACT inhaler Inhale 2 puffs into the lungs every 6 (six) hours as needed for wheezing or shortness of breath.    Marland Kitchen amLODipine (NORVASC) 10 MG tablet Take 1 tablet (10 mg total) by mouth at bedtime. 90 tablet 3  . aspirin EC 81 MG tablet Take 81 mg by mouth at bedtime.     Marland Kitchen azelastine (OPTIVAR) 0.05 % ophthalmic solution Apply 1 drop to eye 2 (two) times daily.    . clotrimazole-betamethasone (LOTRISONE) cream Apply 1 application topically as needed.     . metoprolol succinate (TOPROL-XL) 25 MG 24 hr tablet TAKE 1 TABLET BY MOUTH DAILY 90 tablet 1  . mupirocin ointment (BACTROBAN) 2 % SMARTSIG:Sparingly Topical 3 Times Daily    . rosuvastatin (CRESTOR) 20 MG tablet TAKE 1 TABLET BY MOUTH DAILY 90 tablet 3   Current Facility-Administered Medications  Medication Dose Route Frequency Provider Last Rate Last Admin  . diazepam (VALIUM) tablet 5 mg  5 mg Oral Once [name], MD        REVIEW OF SYSTEMS (negative unless checked):   Cardiac:  []  Chest pain or chest pressure? []  Shortness of breath upon activity? []  Shortness of breath when lying  flat? []  Irregular heart rhythm?  Vascular:  []  Pain in calf, thigh, or hip brought on by walking? []  Pain in feet at night that wakes you up from your sleep? []  Blood clot in your veins? []  Leg swelling?  Pulmonary:  []  Oxygen at home? []  Productive cough? []  Wheezing?  Neurologic:  []  Sudden weakness in arms or legs? []  Sudden numbness in arms or legs? []  Sudden onset of difficult speaking or slurred speech? []  Temporary loss of vision in one eye? []  Problems with dizziness?  Gastrointestinal:  []  Blood in stool? []  Vomited blood?  Genitourinary:  []  Burning when urinating? []  Blood in urine?  Psychiatric:  []  Major depression  Hematologic:  []  Bleeding problems? []  Problems with blood clotting?  Dermatologic:  []  Rashes or ulcers?  Constitutional:  []  Fever or chills?  Ear/Nose/Throat:  []  Change in hearing? []  Nose bleeds? []  Sore throat?  Musculoskeletal:  []  Back pain? []  Joint pain? []  Muscle pain?   Physical Examination   Vitals:   10/28/20 0839  BP: 139/81  Pulse: 75  Resp: 20  Temp: 98.6 F (37 C)  TempSrc: Temporal  SpO2: 95%  Weight: 197 lb 3.2 oz (89.4 kg)  Height: 6' (1.829 m)   Body mass index is 26.75 kg/m.  General:  WDWN in NAD; vital signs documented above Gait: Not observed HENT: WNL, normocephalic Pulmonary: normal non-labored breathing ,  without Rales, rhonchi,  wheezing Cardiac: regular HR Abdomen: soft, NT, reducible ventral hernia Skin: without rashes Vascular Exam/Pulses:  Right Left  Radial 2+ (normal) 2+ (normal)  DP 2+ (normal) 2+ (normal)   Extremities: without ischemic changes, without Gangrene , without cellulitis; without open wounds;  Musculoskeletal: no muscle wasting or atrophy  Neurologic: A&O X 3;  No focal weakness or paresthesias are detected Psychiatric:  The pt has Normal affect.  Non-Invasive Vascular Imaging   EVAR Duplex   AAA sac size: 3.9 cm  no endoleak detected however exam  limited due to bowel gas  L limb measuring 2.5cm   Medical Decision Making   John Choi is a 70 y.o. male who presents s/p EVAR   EVAR duplex was limited due to bowel gas however AAA sac size slightly decreased in size and no endoleak detected  Left limb of stent graft however did increase in size with a maximal diameter of 2.5 cm  Bilateral lower extremities are well perfused with palpable DP pulses  Continue aspirin and statin  Recommended follow up with Dr. Rosendo Gros for ventral hernia  Recheck EVAR duplex in 1 year; will discuss finding of increase in size of left limb with Dr. Trula Slade and will update patient if plan of 1 year follow-up changes   Dagoberto Ligas PA-C [facility] Office: [phone]  Clinic MD: Trula Slade

## 2020-11-26 ENCOUNTER — Other Ambulatory Visit: Payer: Self-pay | Admitting: Family Medicine

## 2020-11-26 ENCOUNTER — Encounter (INDEPENDENT_AMBULATORY_CARE_PROVIDER_SITE_OTHER): Payer: Self-pay | Admitting: *Deleted

## 2020-11-26 ENCOUNTER — Other Ambulatory Visit (HOSPITAL_COMMUNITY): Payer: Self-pay | Admitting: Family Medicine

## 2020-11-26 ENCOUNTER — Ambulatory Visit (HOSPITAL_COMMUNITY)
Admission: RE | Admit: 2020-11-26 | Discharge: 2020-11-26 | Disposition: A | Discharge status: Home / Self Care | Payer: Medicare Other | Source: Ambulatory Visit | Attending: Family Medicine | Admitting: Family Medicine

## 2020-11-26 DIAGNOSIS — R1032 Left lower quadrant pain: Secondary | ICD-10-CM

## 2020-11-26 DIAGNOSIS — N200 Calculus of kidney: Secondary | ICD-10-CM

## 2020-11-26 DIAGNOSIS — R109 Unspecified abdominal pain: Secondary | ICD-10-CM | POA: Diagnosis not present

## 2020-11-26 DIAGNOSIS — Z6828 Body mass index (BMI) 28.0-28.9, adult: Secondary | ICD-10-CM | POA: Diagnosis not present

## 2020-11-26 DIAGNOSIS — R911 Solitary pulmonary nodule: Secondary | ICD-10-CM

## 2020-11-26 LAB — POCT I-STAT CREATININE: Creatinine, Ser: 1.5 mg/dL — ABNORMAL HIGH (ref 0.61–1.24)

## 2020-11-26 MED ORDER — IOHEXOL 300 MG/ML  SOLN
100.0000 mL | Freq: Once | INTRAMUSCULAR | Status: AC | PRN
  Administered 2020-11-26: 100 mL via INTRAVENOUS

## 2020-11-26 MED ORDER — IOHEXOL 9 MG/ML PO SOLN
ORAL | Status: AC
  Filled 2020-11-26: qty 1000

## 2020-11-28 ENCOUNTER — Other Ambulatory Visit: Payer: Self-pay | Admitting: Cardiovascular Disease

## 2020-11-28 DIAGNOSIS — E785 Hyperlipidemia, unspecified: Secondary | ICD-10-CM

## 2020-12-18 ENCOUNTER — Ambulatory Visit: Payer: Self-pay | Admitting: General Surgery

## 2020-12-18 DIAGNOSIS — K429 Umbilical hernia without obstruction or gangrene: Secondary | ICD-10-CM | POA: Diagnosis not present

## 2020-12-18 NOTE — H&P (Signed)
History of Present Illness John Ok MD; 12/18/2020 10:20 AM) The patient is a 70 year old male who presents with an umbilical hernia. 70 year old male, who comes in secondary to a recurrent umbilical hernia. Patient states he's had pain there recently. Patient with CT scan in March 2022 revealed a recurrent ventral hernia. I did review this CT scan personally. Patient previously had this repaired in 2016. This was done with mesh. He's had no signs or symptoms of incarceration or strangulation.         Medication History John Choi, [name]; 12/18/2020 10:03 AM) Rosuvastatin Calcium (20MG  Tablet, Oral) Active. Aspirin (81MG  Tablet Chewable, Oral) Active. Proventil HFA (108 (90 Base)MCG/ACT Aerosol Soln, Inhalation) Active. AmLODIPine Besylate (10MG  Tablet, Oral) Active. Metoprolol Succinate ER (25MG  Tablet ER 24HR, Oral) Active. Medications Reconciled    Review of Systems John Ok, MD; 12/18/2020 10:21 AM) General Present- Appetite Loss. Not Present- Chills, Fatigue, Fever, Night Sweats, Weight Gain and Weight Loss. HEENT Present- Ringing in the Ears. Not Present- Earache, Hearing Loss, Hoarseness, Nose Bleed, Oral Ulcers, Seasonal Allergies, Sinus Pain, Sore Throat, Visual Disturbances, Wears glasses/contact lenses and Yellow Eyes. Respiratory Not Present- Bloody sputum, Chronic Cough, Difficulty Breathing, Snoring and Wheezing. Breast Present- Nipple Discharge. Not Present- Breast Mass, Breast Pain and Skin Changes. Cardiovascular Present- Leg Cramps and Swelling of Extremities. Not Present- Chest Pain, Difficulty Breathing Lying Down, Palpitations, Rapid Heart Rate and Shortness of Breath. Gastrointestinal Present- Change in Bowel Habits. Not Present- Abdominal Pain, Bloating, Bloody Stool, Chronic diarrhea, Constipation, Difficulty Swallowing, Excessive gas, Gets full quickly at meals, Hemorrhoids, Indigestion, Nausea, Rectal Pain and Vomiting. Neurological  Present- Headaches, Seizures, Tremor and Weakness. Not Present- Decreased Memory, Fainting, Numbness, Tingling and Trouble walking. Psychiatric Present- Depression. Not Present- Anxiety, Bipolar, Change in Sleep Pattern, Fearful and Frequent crying. Endocrine Present- Cold Intolerance. Not Present- Excessive Hunger, Hair Changes, Heat Intolerance and New Diabetes. Hematology Not Present- Blood Thinners, Easy Bruising, Excessive bleeding, Gland problems, HIV and Persistent Infections. All other systems negative  Vitals John Choi CMA; 12/18/2020 10:03 AM) 12/18/2020 10:02 AM Weight: 198 lb Height: 70in Body Surface Area: 2.08 m Body Mass Index: 28.41 kg/m  Temp.: 97.105F  Pulse: 104 (Regular)  P.OX: 94% (Room air) BP: 140/80(Sitting, Left Arm, Standard)       Physical Exam John Ok MD; 12/18/2020 10:20 AM) The physical exam findings are as follows: Note: Constitutional: No acute distress, conversant, appears stated age  Eyes: Anicteric sclerae, moist conjunctiva, no lid lag  Neck: No thyromegaly, trachea midline, no cervical lymphadenopathy  Lungs: Clear to auscultation biilaterally, normal respiratory effot  Cardiovascular: regular rate & rhythm, no murmurs, no peripheal edema, pedal pulses 2+  GI: Soft, no masses or hepatosplenomegaly, non-tender to palpation  MSK: Normal gait, no clubbing cyanosis, edema  Skin: No rashes, palpation reveals normal skin turgor  Psychiatric: Appropriate judgment and insight, oriented to person, place, and time  Abdomen Inspection Hernias - Umbilical hernia - Reducible.    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  1. The patient will like to proceed to the operating room for laparoscopic ventral hernia repair with mesh.  2. I discussed with the patient the signs and symptoms of  incarceration and strangulation and the need to proceed to the ER should they occur.  3. I discussed with the patient the risks and benefits of the procedure to include but not limited to: Infection, bleeding,  damage to surrounding structures, possible need for further surgery, possible nerve pain, and possible recurrence. The patient was understanding and wishes to proceed.

## 2020-12-18 NOTE — H&P (View-Only) (Signed)
History of Present Illness Ralene Ok MD; 12/18/2020 10:20 AM) The patient is a 70 year old male who presents with an umbilical hernia. 70 year old male, who comes in secondary to a recurrent umbilical hernia. Patient states he's had pain there recently. Patient with CT scan in March 2022 revealed a recurrent ventral hernia. I did review this CT scan personally. Patient previously had this repaired in 2016. This was done with mesh. He's had no signs or symptoms of incarceration or strangulation.         Medication History Carrolyn Leigh, [name]; 12/18/2020 10:03 AM) Rosuvastatin Calcium (20MG  Tablet, Oral) Active. Aspirin (81MG  Tablet Chewable, Oral) Active. Proventil HFA (108 (90 Base)MCG/ACT Aerosol Soln, Inhalation) Active. AmLODIPine Besylate (10MG  Tablet, Oral) Active. Metoprolol Succinate ER (25MG  Tablet ER 24HR, Oral) Active. Medications Reconciled    Review of Systems Ralene Ok, MD; 12/18/2020 10:21 AM) General Present- Appetite Loss. Not Present- Chills, Fatigue, Fever, Night Sweats, Weight Gain and Weight Loss. HEENT Present- Ringing in the Ears. Not Present- Earache, Hearing Loss, Hoarseness, Nose Bleed, Oral Ulcers, Seasonal Allergies, Sinus Pain, Sore Throat, Visual Disturbances, Wears glasses/contact lenses and Yellow Eyes. Respiratory Not Present- Bloody sputum, Chronic Cough, Difficulty Breathing, Snoring and Wheezing. Breast Present- Nipple Discharge. Not Present- Breast Mass, Breast Pain and Skin Changes. Cardiovascular Present- Leg Cramps and Swelling of Extremities. Not Present- Chest Pain, Difficulty Breathing Lying Down, Palpitations, Rapid Heart Rate and Shortness of Breath. Gastrointestinal Present- Change in Bowel Habits. Not Present- Abdominal Pain, Bloating, Bloody Stool, Chronic diarrhea, Constipation, Difficulty Swallowing, Excessive gas, Gets full quickly at meals, Hemorrhoids, Indigestion, Nausea, Rectal Pain and Vomiting. Neurological  Present- Headaches, Seizures, Tremor and Weakness. Not Present- Decreased Memory, Fainting, Numbness, Tingling and Trouble walking. Psychiatric Present- Depression. Not Present- Anxiety, Bipolar, Change in Sleep Pattern, Fearful and Frequent crying. Endocrine Present- Cold Intolerance. Not Present- Excessive Hunger, Hair Changes, Heat Intolerance and New Diabetes. Hematology Not Present- Blood Thinners, Easy Bruising, Excessive bleeding, Gland problems, HIV and Persistent Infections. All other systems negative  Vitals Arville Go Fox CMA; 12/18/2020 10:03 AM) 12/18/2020 10:02 AM Weight: 198 lb Height: 70in Body Surface Area: 2.08 m Body Mass Index: 28.41 kg/m  Temp.: 97.3F  Pulse: 104 (Regular)  P.OX: 94% (Room air) BP: 140/80(Sitting, Left Arm, Standard)       Physical Exam Ralene Ok MD; 12/18/2020 10:20 AM) The physical exam findings are as follows: Note: Constitutional: No acute distress, conversant, appears stated age  Eyes: Anicteric sclerae, moist conjunctiva, no lid lag  Neck: No thyromegaly, trachea midline, no cervical lymphadenopathy  Lungs: Clear to auscultation biilaterally, normal respiratory effot  Cardiovascular: regular rate & rhythm, no murmurs, no peripheal edema, pedal pulses 2+  GI: Soft, no masses or hepatosplenomegaly, non-tender to palpation  MSK: Normal gait, no clubbing cyanosis, edema  Skin: No rashes, palpation reveals normal skin turgor  Psychiatric: Appropriate judgment and insight, oriented to person, place, and time  Abdomen Inspection Hernias - Umbilical hernia - Reducible.    Assessment & Plan Ralene Ok MD; 12/18/2020 10:21 AM) RECURRENT UMBILICAL HERNIA (H84.6) Impression: Patient is a 70 year old male, with a recurrent umbilical hernia.  1. The patient will like to proceed to the operating room for laparoscopic ventral hernia repair with mesh.  2. I discussed with the patient the signs and symptoms of  incarceration and strangulation and the need to proceed to the ER should they occur.  3. I discussed with the patient the risks and benefits of the procedure to include but not limited to: Infection, bleeding,  damage to surrounding structures, possible need for further surgery, possible nerve pain, and possible recurrence. The patient was understanding and wishes to proceed.

## 2020-12-27 ENCOUNTER — Other Ambulatory Visit (HOSPITAL_COMMUNITY)
Admission: RE | Admit: 2020-12-27 | Discharge: 2020-12-27 | Disposition: A | Discharge status: Home / Self Care | Payer: Medicare Other | Source: Ambulatory Visit | Attending: General Surgery | Admitting: General Surgery

## 2020-12-27 DIAGNOSIS — Z01812 Encounter for preprocedural laboratory examination: Secondary | ICD-10-CM | POA: Diagnosis not present

## 2020-12-27 DIAGNOSIS — Z20822 Contact with and (suspected) exposure to covid-19: Secondary | ICD-10-CM | POA: Insufficient documentation

## 2020-12-27 NOTE — Patient Instructions (Signed)
DUE TO COVID-19 ONLY ONE VISITOR IS ALLOWED TO COME WITH YOU AND STAY IN THE WAITING ROOM ONLY DURING PRE OP AND PROCEDURE DAY OF SURGERY. THE 1 VISITOR  MAY VISIT WITH YOU AFTER SURGERY IN YOUR PRIVATE ROOM DURING VISITING HOURS ONLY!  YOU NEED TO HAVE A COVID 19 TEST ON: 12/27/20, THIS TEST MUST BE DONE BEFORE SURGERY,  COVID TESTING SITE [address] [place] [id number], IT IS ON THE RIGHT GOING OUT [address] APPROXIMATELY  2 MINUTES PAST [employer] ON THE RIGHT. ONCE YOUR COVID TEST IS COMPLETED,  PLEASE BEGIN THE QUARANTINE INSTRUCTIONS AS OUTLINED IN YOUR HANDOUT.                FREELAND PRACHT    Your procedure is scheduled on: 12/31/20   Report to [facility] Main  Entrance   Report to admitting at: 7:30 AM     Call this number if you have problems the morning of surgery [phone]    Remember:  NO SOLID FOOD AFTER MIDNIGHT THE NIGHT PRIOR TO SURGERY. NOTHING BY MOUTH EXCEPT CLEAR LIQUIDS UNTIL: 6:30 AM . PLEASE FINISH ENSURE DRINK PER SURGEON ORDER  WHICH NEEDS TO BE COMPLETED AT: 6:30 AM .  CLEAR LIQUID DIET  Foods Allowed                                                                     Foods Excluded  Coffee and tea, regular and decaf                             liquids that you cannot  Plain Jell-O any favor except red or purple                                           see through such as: Fruit ices (not with fruit pulp)                                     milk, soups, orange juice  Iced Popsicles                                    All solid food Carbonated beverages, regular and diet                                    Cranberry, grape and apple juices Sports drinks like Gatorade Lightly seasoned clear broth or consume(fat free) Sugar, honey syrup  Sample Menu Breakfast                                Lunch  Supper Cranberry juice                    Beef broth                            Chicken  broth Jell-O                                     Grape juice                           Apple juice Coffee or tea                        Jell-O                                      Popsicle                                                Coffee or tea                        Coffee or tea  _____________________________________________________________________   BRUSH YOUR TEETH MORNING OF SURGERY AND RINSE YOUR MOUTH OUT, NO CHEWING GUM CANDY OR MINTS.    Take these medicines the morning of surgery with A SIP OF WATER: amlodipine,metoprolol.Use inhalers and eye drops as usual.                               You may not have any metal on your body including hair pins and              piercings  Do not wear jewelry, lotions, powders or perfumes, deodorant             Men may shave face and neck.   Do not bring valuables to the hospital. [place].  Contacts, dentures or bridgework may not be worn into surgery.  Leave suitcase in the car. After surgery it may be brought to your room.     Patients discharged the day of surgery will not be allowed to drive home. IF YOU ARE HAVING SURGERY AND GOING HOME THE SAME DAY, YOU MUST HAVE AN ADULT TO DRIVE YOU HOME AND BE WITH YOU FOR 24 HOURS. YOU MAY GO HOME BY TAXI OR UBER OR ORTHERWISE, BUT AN ADULT MUST ACCOMPANY YOU HOME AND STAY WITH YOU FOR 24 HOURS.  Name and phone number of your driver:  Special Instructions: N/A              Please read over the following fact sheets you were given: _____________________________________________________________________         [facility] - Preparing for Surgery Before surgery, you can play an important role.  Because skin is not sterile, your skin needs to be as free of germs as possible.  You can reduce the number of germs on your skin  by washing with CHG (chlorahexidine gluconate) soap before surgery.  CHG is an antiseptic cleaner which kills germs and bonds  with the skin to continue killing germs even after washing. Please DO NOT use if you have an allergy to CHG or antibacterial soaps.  If your skin becomes reddened/irritated stop using the CHG and inform your nurse when you arrive at Short Stay. Do not shave (including legs and underarms) for at least 48 hours prior to the first CHG shower.  You may shave your face/neck. Please follow these instructions carefully:  1.  Shower with CHG Soap the night before surgery and the  morning of Surgery.  2.  If you choose to wash your hair, wash your hair first as usual with your  normal  shampoo.  3.  After you shampoo, rinse your hair and body thoroughly to remove the  shampoo.                           4.  Use CHG as you would any other liquid soap.  You can apply chg directly  to the skin and wash                       Gently with a scrungie or clean washcloth.  5.  Apply the CHG Soap to your body ONLY FROM THE NECK DOWN.   Do not use on face/ open                           Wound or open sores. Avoid contact with eyes, ears mouth and genitals (private parts).                       Wash face,  Genitals (private parts) with your normal soap.             6.  Wash thoroughly, paying special attention to the area where your surgery  will be performed.  7.  Thoroughly rinse your body with warm water from the neck down.  8.  DO NOT shower/wash with your normal soap after using and rinsing off  the CHG Soap.                9.  Pat yourself dry with a clean towel.            10.  Wear clean pajamas.            11.  Place clean sheets on your bed the night of your first shower and do not  sleep with pets. Day of Surgery : Do not apply any lotions/deodorants the morning of surgery.  Please wear clean clothes to the hospital/surgery center.  FAILURE TO FOLLOW THESE INSTRUCTIONS MAY RESULT IN THE CANCELLATION OF YOUR SURGERY PATIENT SIGNATURE_________________________________  NURSE  SIGNATURE__________________________________  ________________________________________________________________________

## 2020-12-28 LAB — SARS CORONAVIRUS 2 (TAT 6-24 HRS): SARS Coronavirus 2: NEGATIVE

## 2020-12-30 ENCOUNTER — Encounter (HOSPITAL_COMMUNITY)
Admission: RE | Admit: 2020-12-30 | Discharge: 2020-12-30 | Disposition: A | Discharge status: Home / Self Care | Payer: Medicare Other | Source: Ambulatory Visit | Attending: General Surgery | Admitting: General Surgery

## 2020-12-30 ENCOUNTER — Encounter (HOSPITAL_COMMUNITY): Payer: Self-pay

## 2020-12-30 ENCOUNTER — Other Ambulatory Visit: Payer: Self-pay

## 2020-12-30 DIAGNOSIS — Z01812 Encounter for preprocedural laboratory examination: Secondary | ICD-10-CM | POA: Insufficient documentation

## 2020-12-30 HISTORY — DX: Pneumonia, unspecified organism: J18.9

## 2020-12-30 HISTORY — DX: Malignant (primary) neoplasm, unspecified: C80.1

## 2020-12-30 LAB — BASIC METABOLIC PANEL
Anion gap: 6 (ref 5–15)
BUN: 17 mg/dL (ref 8–23)
CO2: 25 mmol/L (ref 22–32)
Calcium: 9.3 mg/dL (ref 8.9–10.3)
Chloride: 109 mmol/L (ref 98–111)
Creatinine, Ser: 1.65 mg/dL — ABNORMAL HIGH (ref 0.61–1.24)
GFR, Estimated: 45 mL/min — ABNORMAL LOW (ref >60)
Glucose, Bld: 94 mg/dL (ref 70–99)
Potassium: 4.8 mmol/L (ref 3.5–5.1)
Sodium: 140 mmol/L (ref 135–145)

## 2020-12-30 LAB — CBC
HCT: 48.1 % (ref 39.0–52.0)
Hemoglobin: 16.3 g/dL (ref 13.0–17.0)
MCH: 32.3 pg (ref 26.0–34.0)
MCHC: 33.9 g/dL (ref 30.0–36.0)
MCV: 95.4 fL (ref 80.0–100.0)
Platelets: 265 10*3/uL (ref 150–400)
RBC: 5.04 MIL/uL (ref 4.22–5.81)
RDW: 14 % (ref 11.5–15.5)
WBC: 6.5 10*3/uL (ref 4.0–10.5)
nRBC: 0 % (ref 0.0–0.2)

## 2020-12-30 NOTE — Progress Notes (Signed)
COVID Vaccine Completed: Yes Date COVID Vaccine completed: 08/20/20 COVID vaccine manufacturer:   Moderna     PCP - Dr. Sharilyn Sites: LOV: 11/26/20 Cardiologist - Dr. [name]. LOV: 11/22/19  Chest x-ray -  EKG - 11/22/19 Stress Test -  ECHO -  Cardiac Cath -  Pacemaker/ICD device last checked:  Sleep Study -  CPAP -   Fasting Blood Sugar -  Checks Blood Sugar _____ times a day  Blood Thinner Instructions: Aspirin Instructions: Last Dose:  Anesthesia review: Hx: HTN,CHF,PVD,Smoker.  Patient denies shortness of breath, fever, cough and chest pain at PAT appointment   Patient verbalized understanding of instructions that were given to them at the PAT appointment. Patient was also instructed that they will need to review over the PAT instructions again at home before surgery.

## 2020-12-31 ENCOUNTER — Ambulatory Visit (HOSPITAL_COMMUNITY): Payer: Medicare Other | Admitting: Physician Assistant

## 2020-12-31 ENCOUNTER — Ambulatory Visit (HOSPITAL_COMMUNITY)
Admission: RE | Admit: 2020-12-31 | Discharge: 2020-12-31 | Disposition: A | Discharge status: Home / Self Care | Payer: Medicare Other | Attending: General Surgery | Admitting: General Surgery

## 2020-12-31 ENCOUNTER — Encounter (HOSPITAL_COMMUNITY): Payer: Self-pay | Admitting: General Surgery

## 2020-12-31 ENCOUNTER — Ambulatory Visit (HOSPITAL_COMMUNITY): Payer: Medicare Other | Admitting: Anesthesiology

## 2020-12-31 ENCOUNTER — Encounter (HOSPITAL_COMMUNITY)
Admission: RE | Disposition: A | Discharge status: Home / Self Care | Payer: Self-pay | Source: Home / Self Care | Attending: General Surgery

## 2020-12-31 DIAGNOSIS — I130 Hypertensive heart and chronic kidney disease with heart failure and stage 1 through stage 4 chronic kidney disease, or unspecified chronic kidney disease: Secondary | ICD-10-CM | POA: Diagnosis not present

## 2020-12-31 DIAGNOSIS — Z7982 Long term (current) use of aspirin: Secondary | ICD-10-CM | POA: Insufficient documentation

## 2020-12-31 DIAGNOSIS — K439 Ventral hernia without obstruction or gangrene: Secondary | ICD-10-CM | POA: Diagnosis not present

## 2020-12-31 DIAGNOSIS — K432 Incisional hernia without obstruction or gangrene: Secondary | ICD-10-CM | POA: Insufficient documentation

## 2020-12-31 DIAGNOSIS — N183 Chronic kidney disease, stage 3 unspecified: Secondary | ICD-10-CM | POA: Diagnosis not present

## 2020-12-31 DIAGNOSIS — Z79899 Other long term (current) drug therapy: Secondary | ICD-10-CM | POA: Diagnosis not present

## 2020-12-31 DIAGNOSIS — I5032 Chronic diastolic (congestive) heart failure: Secondary | ICD-10-CM | POA: Diagnosis not present

## 2020-12-31 HISTORY — PX: VENTRAL HERNIA REPAIR: SHX424

## 2020-12-31 SURGERY — REPAIR, HERNIA, VENTRAL, LAPAROSCOPIC
Anesthesia: General | Site: Abdomen

## 2020-12-31 MED ORDER — MEPERIDINE HCL 50 MG/ML IJ SOLN: 6.2500 mg | INTRAMUSCULAR | Status: DC | PRN

## 2020-12-31 MED ORDER — CEFAZOLIN SODIUM-DEXTROSE 2-4 GM/100ML-% IV SOLN
2.0000 g | INTRAVENOUS | Status: AC
  Administered 2020-12-31: 2 g via INTRAVENOUS
  Filled 2020-12-31: qty 100

## 2020-12-31 MED ORDER — ACETAMINOPHEN 500 MG PO TABS
1000.0000 mg | ORAL_TABLET | ORAL | Status: AC
  Administered 2020-12-31: 1000 mg via ORAL
  Filled 2020-12-31: qty 2

## 2020-12-31 MED ORDER — LIDOCAINE 2% (20 MG/ML) 5 ML SYRINGE
INTRAMUSCULAR | Status: AC
  Filled 2020-12-31: qty 5

## 2020-12-31 MED ORDER — ONDANSETRON HCL 4 MG/2ML IJ SOLN
INTRAMUSCULAR | Status: DC | PRN
  Administered 2020-12-31: 4 mg via INTRAVENOUS

## 2020-12-31 MED ORDER — OXYCODONE HCL 5 MG PO TABS
ORAL_TABLET | ORAL | Status: AC
  Filled 2020-12-31: qty 1

## 2020-12-31 MED ORDER — DEXAMETHASONE SODIUM PHOSPHATE 10 MG/ML IJ SOLN
INTRAMUSCULAR | Status: AC
  Filled 2020-12-31: qty 1

## 2020-12-31 MED ORDER — FENTANYL CITRATE (PF) 100 MCG/2ML IJ SOLN
25.0000 ug | INTRAMUSCULAR | Status: DC | PRN
  Administered 2020-12-31: 25 ug via INTRAVENOUS

## 2020-12-31 MED ORDER — DIPHENHYDRAMINE HCL 50 MG/ML IJ SOLN
INTRAMUSCULAR | Status: DC | PRN
  Administered 2020-12-31: 12.5 mg via INTRAVENOUS

## 2020-12-31 MED ORDER — PHENYLEPHRINE 40 MCG/ML (10ML) SYRINGE FOR IV PUSH (FOR BLOOD PRESSURE SUPPORT)
PREFILLED_SYRINGE | INTRAVENOUS | Status: AC
  Filled 2020-12-31: qty 10

## 2020-12-31 MED ORDER — PHENYLEPHRINE 40 MCG/ML (10ML) SYRINGE FOR IV PUSH (FOR BLOOD PRESSURE SUPPORT)
PREFILLED_SYRINGE | INTRAVENOUS | Status: DC | PRN
  Administered 2020-12-31: 80 ug via INTRAVENOUS

## 2020-12-31 MED ORDER — ROCURONIUM BROMIDE 100 MG/10ML IV SOLN
INTRAVENOUS | Status: DC | PRN
  Administered 2020-12-31: 60 mg via INTRAVENOUS

## 2020-12-31 MED ORDER — LACTATED RINGERS IV SOLN: INTRAVENOUS | Status: DC

## 2020-12-31 MED ORDER — ONDANSETRON HCL 4 MG/2ML IJ SOLN: 4.0000 mg | Freq: Once | INTRAMUSCULAR | Status: DC | PRN

## 2020-12-31 MED ORDER — ONDANSETRON HCL 4 MG/2ML IJ SOLN
INTRAMUSCULAR | Status: AC
  Filled 2020-12-31: qty 2

## 2020-12-31 MED ORDER — 0.9 % SODIUM CHLORIDE (POUR BTL) OPTIME
TOPICAL | Status: DC | PRN
  Administered 2020-12-31: 400 mL

## 2020-12-31 MED ORDER — OXYCODONE HCL 5 MG PO TABS
5.0000 mg | ORAL_TABLET | Freq: Once | ORAL | Status: AC | PRN
  Administered 2020-12-31: 5 mg via ORAL

## 2020-12-31 MED ORDER — FENTANYL CITRATE (PF) 250 MCG/5ML IJ SOLN
INTRAMUSCULAR | Status: AC
  Filled 2020-12-31: qty 5

## 2020-12-31 MED ORDER — PROPOFOL 10 MG/ML IV BOLUS
INTRAVENOUS | Status: DC | PRN
  Administered 2020-12-31: 150 mg via INTRAVENOUS

## 2020-12-31 MED ORDER — CHLORHEXIDINE GLUCONATE 0.12 % MT SOLN
15.0000 mL | Freq: Once | OROMUCOSAL | Status: AC
  Administered 2020-12-31: 15 mL via OROMUCOSAL

## 2020-12-31 MED ORDER — CHLORHEXIDINE GLUCONATE CLOTH 2 % EX PADS: 6.0000 | MEDICATED_PAD | Freq: Once | CUTANEOUS | Status: DC

## 2020-12-31 MED ORDER — MIDAZOLAM HCL 5 MG/5ML IJ SOLN
INTRAMUSCULAR | Status: DC | PRN
  Administered 2020-12-31: 2 mg via INTRAVENOUS

## 2020-12-31 MED ORDER — PROPOFOL 10 MG/ML IV BOLUS
INTRAVENOUS | Status: AC
  Filled 2020-12-31: qty 20

## 2020-12-31 MED ORDER — FENTANYL CITRATE (PF) 100 MCG/2ML IJ SOLN
INTRAMUSCULAR | Status: DC | PRN
  Administered 2020-12-31: 100 ug via INTRAVENOUS

## 2020-12-31 MED ORDER — ORAL CARE MOUTH RINSE: 15.0000 mL | Freq: Once | OROMUCOSAL | Status: AC

## 2020-12-31 MED ORDER — OXYCODONE HCL 5 MG/5ML PO SOLN: 5.0000 mg | Freq: Once | ORAL | Status: AC | PRN

## 2020-12-31 MED ORDER — DEXAMETHASONE SODIUM PHOSPHATE 10 MG/ML IJ SOLN
INTRAMUSCULAR | Status: DC | PRN
  Administered 2020-12-31: 5 mg via INTRAVENOUS

## 2020-12-31 MED ORDER — BUPIVACAINE-EPINEPHRINE 0.25% -1:200000 IJ SOLN
INTRAMUSCULAR | Status: DC | PRN
  Administered 2020-12-31: 20 mL

## 2020-12-31 MED ORDER — MIDAZOLAM HCL 2 MG/2ML IJ SOLN
INTRAMUSCULAR | Status: AC
  Filled 2020-12-31: qty 2

## 2020-12-31 MED ORDER — ACETAMINOPHEN 160 MG/5ML PO SOLN: 325.0000 mg | ORAL | Status: DC | PRN

## 2020-12-31 MED ORDER — BUPIVACAINE-EPINEPHRINE (PF) 0.25% -1:200000 IJ SOLN
INTRAMUSCULAR | Status: AC
  Filled 2020-12-31: qty 30

## 2020-12-31 MED ORDER — TRAMADOL HCL 50 MG PO TABS: 50.0000 mg | ORAL_TABLET | Freq: Four times a day (QID) | ORAL | 0 refills | Status: DC | PRN

## 2020-12-31 MED ORDER — LIDOCAINE HCL (CARDIAC) PF 100 MG/5ML IV SOSY
PREFILLED_SYRINGE | INTRAVENOUS | Status: DC | PRN
  Administered 2020-12-31: 60 mg via INTRAVENOUS

## 2020-12-31 MED ORDER — ACETAMINOPHEN 325 MG PO TABS: 325.0000 mg | ORAL_TABLET | ORAL | Status: DC | PRN

## 2020-12-31 MED ORDER — SUGAMMADEX SODIUM 200 MG/2ML IV SOLN
INTRAVENOUS | Status: DC | PRN
  Administered 2020-12-31: 200 mg via INTRAVENOUS

## 2020-12-31 MED ORDER — SODIUM CHLORIDE (PF) 0.9 % IJ SOLN
INTRAMUSCULAR | Status: AC
  Filled 2020-12-31: qty 20

## 2020-12-31 MED ORDER — DIPHENHYDRAMINE HCL 50 MG/ML IJ SOLN
INTRAMUSCULAR | Status: AC
  Filled 2020-12-31: qty 3

## 2020-12-31 MED ORDER — FENTANYL CITRATE (PF) 100 MCG/2ML IJ SOLN
INTRAMUSCULAR | Status: AC
  Filled 2020-12-31: qty 2

## 2020-12-31 MED ORDER — ENSURE PRE-SURGERY PO LIQD: 296.0000 mL | Freq: Once | ORAL | Status: DC

## 2020-12-31 MED ORDER — ROCURONIUM BROMIDE 10 MG/ML (PF) SYRINGE
PREFILLED_SYRINGE | INTRAVENOUS | Status: AC
  Filled 2020-12-31: qty 10

## 2020-12-31 SURGICAL SUPPLY — 38 items
APPLIER CLIP 5 13 M/L LIGAMAX5 (MISCELLANEOUS)
BINDER ABDOMINAL 12 ML 46-62 (SOFTGOODS) IMPLANT
CABLE HIGH FREQUENCY MONO STRZ (ELECTRODE) ×2 IMPLANT
CHLORAPREP W/TINT 26 (MISCELLANEOUS) ×2 IMPLANT
CLIP APPLIE 5 13 M/L LIGAMAX5 (MISCELLANEOUS) IMPLANT
COVER WAND RF STERILE (DRAPES) IMPLANT
DECANTER SPIKE VIAL GLASS SM (MISCELLANEOUS) ×2 IMPLANT
DERMABOND ADVANCED (GAUZE/BANDAGES/DRESSINGS) ×1
DERMABOND ADVANCED .7 DNX12 (GAUZE/BANDAGES/DRESSINGS) ×1 IMPLANT
DEVICE SECURE STRAP 25 ABSORB (INSTRUMENTS) ×2 IMPLANT
DEVICE TROCAR PUNCTURE CLOSURE (ENDOMECHANICALS) ×2 IMPLANT
ELECT REM PT RETURN 15FT ADLT (MISCELLANEOUS) ×2 IMPLANT
GLOVE SURG ENC MOIS LTX SZ7.5 (GLOVE) ×2 IMPLANT
GOWN STRL REUS W/TWL XL LVL3 (GOWN DISPOSABLE) ×6 IMPLANT
KIT BASIN OR (CUSTOM PROCEDURE TRAY) ×2 IMPLANT
KIT TURNOVER KIT A (KITS) ×2 IMPLANT
MARKER SKIN DUAL TIP RULER LAB (MISCELLANEOUS) ×2 IMPLANT
MESH VENTRALIGHT ST 4.5IN (Mesh General) ×2 IMPLANT
NEEDLE INSUFFLATION 14GA 120MM (NEEDLE) ×2 IMPLANT
PAD POSITIONING PINK XL (MISCELLANEOUS) ×2 IMPLANT
PENCIL SMOKE EVACUATOR (MISCELLANEOUS) IMPLANT
PROTECTOR NERVE ULNAR (MISCELLANEOUS) IMPLANT
SCISSORS LAP 5X35 DISP (ENDOMECHANICALS) ×2 IMPLANT
SET IRRIG TUBING LAPAROSCOPIC (IRRIGATION / IRRIGATOR) IMPLANT
SET TUBE SMOKE EVAC HIGH FLOW (TUBING) ×2 IMPLANT
SHEARS HARMONIC ACE PLUS 36CM (ENDOMECHANICALS) IMPLANT
SLEEVE XCEL OPT CAN 5 100 (ENDOMECHANICALS) ×4 IMPLANT
SUT CHROMIC 2 0 SH (SUTURE) ×4 IMPLANT
SUT MNCRL AB 4-0 PS2 18 (SUTURE) ×2 IMPLANT
SUT NOVA NAB GS-21 0 18 T12 DT (SUTURE) ×2 IMPLANT
SUT NOVA NAB GS-21 1 T12 (SUTURE) IMPLANT
SUT PDS AB 0 CT1 36 (SUTURE) IMPLANT
SUT PDS AB 1 CT1 27 (SUTURE) IMPLANT
SUT PROLENE 2 0 KS (SUTURE) ×2 IMPLANT
TAPE CLOTH 4X10 WHT NS (GAUZE/BANDAGES/DRESSINGS) IMPLANT
TOWEL OR 17X26 10 PK STRL BLUE (TOWEL DISPOSABLE) ×2 IMPLANT
TOWEL OR NON WOVEN STRL DISP B (DISPOSABLE) ×2 IMPLANT
TRAY LAPAROSCOPIC (CUSTOM PROCEDURE TRAY) ×2 IMPLANT

## 2020-12-31 NOTE — Transfer of Care (Signed)
Immediate Anesthesia Transfer of Care Note  Patient: John Choi  Procedure(s) Performed: LAPAROSCOPIC VENTRAL HERNIA REPAIR WITH MESH (N/A Abdomen)  Patient Location: PACU  Anesthesia Type:General  Level of Consciousness: awake, alert , drowsy and patient cooperative  Airway & Oxygen Therapy: Patient Spontanous Breathing and Patient connected to face mask oxygen  Post-op Assessment: Report given to RN and Post -op Vital signs reviewed and stable  Post vital signs: Reviewed and stable  Last Vitals:  Vitals Value Taken Time  BP    Temp    Pulse 76 12/31/20 1004  Resp 6 12/31/20 1004  SpO2 92 % 12/31/20 1004  Vitals shown include unvalidated device data.  Last Pain:  Vitals:   12/31/20 0855  TempSrc: Oral  PainSc:          Complications: No complications documented.

## 2020-12-31 NOTE — Op Note (Signed)
12/31/2020  9:49 AM  PATIENT:  John Choi  70 y.o. male  PRE-OPERATIVE DIAGNOSIS:  VENTRAL HERNIA  POST-OPERATIVE DIAGNOSIS: VENTRAL HERNIA  PROCEDURE:  Procedure(s) with comments: LAPAROSCOPIC VENTRAL HERNIA REPAIR WITH MESH (N/A) -   SURGEON:  Surgeon(s) and Role:    * Ralene Ok, MD - Primary  ASSISTANTS: Caryn Bee, PGY-6   ANESTHESIA:   local and general  EBL:  minimal   BLOOD ADMINISTERED:none  DRAINS: none   LOCAL MEDICATIONS USED:  BUPIVICAINE   SPECIMEN:  No Specimen  DISPOSITION OF SPECIMEN:  N/A  COUNTS:  YES  TOURNIQUET:  * No tourniquets in log *  DICTATION: .Dragon Dictation  Details of the procedure:   After the patient was consented patient was taken back to the operating room patient was then placed in supine position bilateral SCDs in place.  The patient was prepped and draped in the usual sterile fashion. After antibiotics were confirmed a timeout was called and all facts were verified. The Veress needle technique was used to insuflate the abdomen at Palmer's point. The abdomen was insufflated to 14 mm mercury. Subsequently a 5 mm trocar was placed a camera inserted there was no injury to any intra-abdominal organs.    There was seen to be an non-incarcerated  1.5cm hernia just inferior to his previous hernia repair in the midline.  A second camera port was in placed into the left lower quadrant.    I proceeded to reduce the hernia contents, there was some small bowel within the hernia itself.  The hernia sac was dissected out of the hernia and disposed.  The fascia at the hernia was reapproximated using a #1Novafil x 3.  Once the hernia was cleared away, a Bard Ventralight 11.4cm  mesh was inserted into the abdomen.  The mesh was secured circumferentially with am Securestrap tacker in a double crown fashion.    The omentum was brought over the area of the mesh. The pneumoperitoneum was evacuated  & all trocars  were removed. The skin was  reapproximated with 4-0  Monocryl sutures in a subcuticular fashion. The skin was dressed with Dermabond.  The patient was taken to the recovery room in stable condition.  Type of repair -primary suture & mesh  Mesh overlap - 4cm  Placement of mesh -  beneath fascia and into peritoneal cavity   PLAN OF CARE: Discharge to home after PACU  PATIENT DISPOSITION:  PACU - hemodynamically stable.   Delay start of Pharmacological VTE agent (>24hrs) due to surgical blood loss or risk of bleeding: not applicable

## 2020-12-31 NOTE — Anesthesia Postprocedure Evaluation (Signed)
Anesthesia Post Note  Patient: John Choi  Procedure(s) Performed: LAPAROSCOPIC VENTRAL HERNIA REPAIR WITH MESH (N/A Abdomen)     Patient location during evaluation: PACU Anesthesia Type: General Level of consciousness: awake and alert Pain management: pain level controlled Vital Signs Assessment: post-procedure vital signs reviewed and stable Respiratory status: spontaneous breathing, nonlabored ventilation, respiratory function stable and patient connected to nasal cannula oxygen Cardiovascular status: blood pressure returned to baseline and stable Postop Assessment: no apparent nausea or vomiting Anesthetic complications: no   No complications documented.  Last Vitals:  Vitals:   12/31/20 1145 12/31/20 1200  BP: (!) 145/91 (!) 138/97  Pulse: 89 89  Resp: 18 16  Temp: (!) 36.4 C   SpO2: 91% 90%    Last Pain:  Vitals:   12/31/20 1145  TempSrc:   PainSc: 5                  Suly Vukelich

## 2020-12-31 NOTE — Interval H&P Note (Signed)
History and Physical Interval Note:  12/31/2020 8:09 AM  John Choi  has presented today for surgery, with the diagnosis of RECURRENT VENTRAL HERNIA.  The various methods of treatment have been discussed with the patient and family. After consideration of risks, benefits and other options for treatment, the patient has consented to  Procedure(s) with comments: [place] (N/A) - 90/RM4 as a surgical intervention.  The patient's history has been reviewed, patient examined, no change in status, stable for surgery.  I have reviewed the patient's chart and labs.  Questions were answered to the patient's satisfaction.     Ralene Ok

## 2020-12-31 NOTE — Discharge Instructions (Signed)
[facility] _______[facility] ° °UMBILICAL OR INGUINAL HERNIA REPAIR: POST OP INSTRUCTIONS ° °Always review your discharge instruction sheet given to you by the facility where your surgery was performed. °IF YOU HAVE DISABILITY OR FAMILY LEAVE FORMS, YOU MUST BRING THEM TO THE OFFICE FOR PROCESSING.   °DO NOT GIVE THEM TO YOUR DOCTOR. ° °1. A  prescription for pain medication may be given to you upon discharge.  Take your pain medication as prescribed, if needed.  If narcotic pain medicine is not needed, then you may take acetaminophen (Tylenol) or ibuprofen (Advil) as needed. °2. Take your usually prescribed medications unless otherwise directed. °If you need a refill on your pain medication, please contact your pharmacy.  They will contact our office to request authorization. Prescriptions will not be filled after 5 pm or on week-ends. °3. You should follow a light diet the first 24 hours after arrival home, such as soup and crackers, etc.  Be sure to include lots of fluids daily.  Resume your normal diet the day after surgery. °4.Most patients will experience some swelling and bruising around the umbilicus or in the groin and scrotum.  Ice packs and reclining will help.  Swelling and bruising can take several days to resolve.  °6. It is common to experience some constipation if taking pain medication after surgery.  Increasing fluid intake and taking a stool softener (such as Colace) will usually help or prevent this problem from occurring.  A mild laxative (Milk of Magnesia or Miralax) should be taken according to package directions if there are no bowel movements after 48 hours. °7. Unless discharge instructions indicate otherwise, you may remove your bandages 24-48 hours after surgery, and you may shower at that time.  You may have steri-strips (small skin tapes) in place directly over the incision.  These strips should be left on the skin for 7-10 days.  If your surgeon used skin glue on the  incision, you may shower in 24 hours.  The glue will flake off over the next 2-3 weeks.  Any sutures or staples will be removed at the office during your follow-up visit. °8. ACTIVITIES:  You may resume regular (light) daily activities beginning the next day--such as daily self-care, walking, climbing stairs--gradually increasing activities as tolerated.  You may have sexual intercourse when it is comfortable.  Refrain from any heavy lifting or straining until approved by your doctor. ° °a.You may drive when you are no longer taking prescription pain medication, you can comfortably wear a seatbelt, and you can safely maneuver your car and apply brakes. °b.RETURN TO WORK:   °_____________________________________________ ° °9.You should see your doctor in the office for a follow-up appointment approximately 2-3 weeks after your surgery.  Make sure that you call for this appointment within a day or two after you arrive home to insure a convenient appointment time. °10.OTHER INSTRUCTIONS: _________________________ °   _____________________________________ ° °WHEN TO CALL YOUR DOCTOR: °1. Fever over 101.0 °2. Inability to urinate °3. Nausea and/or vomiting °4. Extreme swelling or bruising °5. Continued bleeding from incision. °6. Increased pain, redness, or drainage from the incision ° °The clinic staff is available to answer your questions during regular business hours.  Please don’t hesitate to call and ask to speak to one of the nurses for clinical concerns.  If you have a medical emergency, go to the nearest emergency room or call 911.  A surgeon from [facility] is always on call at the hospital ° ° °  [address], [address], [place], [place]  [address] ? ° [address] [address], [place], [place]   [address] °[phone] ? [phone] ? FAX [phone] °Web site: [email or web address] °

## 2020-12-31 NOTE — Anesthesia Preprocedure Evaluation (Addendum)
Anesthesia Evaluation  Patient identified by MRN, date of birth, ID band Patient awake    Reviewed: Allergy & Precautions, H&P , NPO status , Patient's Chart, lab work & pertinent test results  Airway Mallampati: III  TM Distance: >3 FB Neck ROM: Full    Dental no notable dental hx. (+) Edentulous Upper, Edentulous Lower   Pulmonary asthma , Current Smoker and Patient abstained from smoking.,    Pulmonary exam normal breath sounds clear to auscultation       Cardiovascular hypertension, Pt. on medications + Peripheral Vascular Disease  Normal cardiovascular exam Rhythm:Regular Rate:Normal  05/2014 Cardiac cath. Widely patent coronary arteries. Normal LV function.   Neuro/Psych    GI/Hepatic   Endo/Other    Renal/GU CRFRenal disease     Musculoskeletal   Abdominal   Peds  Hematology   Anesthesia Other Findings   Reproductive/Obstetrics                          Anesthesia Physical  Anesthesia Plan  ASA: III  Anesthesia Plan: General   Post-op Pain Management:    Induction: Intravenous  PONV Risk Score and Plan: 1 and Ondansetron and Dexamethasone  Airway Management Planned: Oral ETT  Additional Equipment: None  Intra-op Plan:   Post-operative Plan: Extubation in OR  Informed Consent: I have reviewed the patients History and Physical, chart, labs and discussed the procedure including the risks, benefits and alternatives for the proposed anesthesia with the patient or authorized representative who has indicated his/her understanding and acceptance.     Dental advisory given  Plan Discussed with: CRNA, Surgeon and Anesthesiologist  Anesthesia Plan Comments:         Anesthesia Quick Evaluation

## 2020-12-31 NOTE — Anesthesia Procedure Notes (Signed)
Procedure Name: Intubation Date/Time: 12/31/2020 9:10 AM Performed by: Elaina Pattee, CRNA Pre-anesthesia Checklist: Patient identified, Emergency Drugs available, Suction available, Patient being monitored and Timeout performed Patient Re-evaluated:Patient Re-evaluated prior to induction Oxygen Delivery Method: Circle system utilized Preoxygenation: Pre-oxygenation with 100% oxygen Induction Type: IV induction Ventilation: Two handed mask ventilation required and Oral airway inserted - appropriate to patient size Laryngoscope Size: Mac and 4 Grade View: Grade II Tube type: Oral Tube size: 7.5 mm Airway Equipment and Method: Patient positioned with wedge pillow

## 2021-01-01 ENCOUNTER — Other Ambulatory Visit: Payer: Self-pay | Admitting: Cardiovascular Disease

## 2021-01-01 ENCOUNTER — Encounter (HOSPITAL_COMMUNITY): Payer: Self-pay | Admitting: General Surgery

## 2021-01-01 DIAGNOSIS — E785 Hyperlipidemia, unspecified: Secondary | ICD-10-CM

## 2021-01-14 ENCOUNTER — Ambulatory Visit (INDEPENDENT_AMBULATORY_CARE_PROVIDER_SITE_OTHER): Payer: Medicare Other | Admitting: Physician Assistant

## 2021-01-14 ENCOUNTER — Other Ambulatory Visit: Payer: Self-pay

## 2021-01-14 ENCOUNTER — Encounter: Payer: Self-pay | Admitting: Physician Assistant

## 2021-01-14 VITALS — BP 116/88 | HR 80 | Ht 72.0 in | Wt 198.8 lb

## 2021-01-14 DIAGNOSIS — Z720 Tobacco use: Secondary | ICD-10-CM | POA: Diagnosis not present

## 2021-01-14 DIAGNOSIS — E782 Mixed hyperlipidemia: Secondary | ICD-10-CM | POA: Diagnosis not present

## 2021-01-14 DIAGNOSIS — I714 Abdominal aortic aneurysm, without rupture, unspecified: Secondary | ICD-10-CM

## 2021-01-14 DIAGNOSIS — I10 Essential (primary) hypertension: Secondary | ICD-10-CM | POA: Diagnosis not present

## 2021-01-14 NOTE — Patient Instructions (Signed)

## 2021-01-14 NOTE — Progress Notes (Signed)
Cardiology Office Note:    Date:  01/14/2021   ID:  John Choi, DOB 06-12-1951, MRN [id number]  PCP:  Sharilyn Sites, MD  [facility] Cardiologist:  [name], MD  [place] Electrophysiologist:  None   Chief Complaint: Yearly follow up   History of Present Illness:    John Choi is a 70 y.o. male with a hx of AAA s/p EVAR in 2015 by Dr. Trula Slade, HLD, bifascicular block, tobacco smoking and HTN presents for yearly follow up.   Prior smoker but started back after accident in 2018.   Seen for yearly follow-up.  Currently smoking less than half pack a day per day.  At baseline he is very active.  He does carpentry work.  Recently built back deck.  He denies chest pain, shortness of breath, orthopnea, PND, syncope, lower extremity edema or melena.  Past Medical History:  Diagnosis Date  . Asthma   . Cancer ([place])    skin  . CHF (congestive heart failure) ([place])   . Head trauma   . High cholesterol   . History of kidney stones   . Hypertension   . Peripheral vascular disease ([place])   . Pneumonia   . Pubic ramus fracture ([place]) 02/2000   left; thrown from tractor/notes 03/05/2017    Past Surgical History:  Procedure Laterality Date  . ABDOMINAL AORTIC ENDOVASCULAR STENT GRAFT N/A 07/20/2014   Procedure: ABDOMINAL AORTIC ENDOVASCULAR STENT GRAFT;  Surgeon: Serafina Mitchell, MD;  Location: [facility];  Service: Vascular;  Laterality: N/A;  . BACK SURGERY    . BREAST SURGERY    . CARDIAC CATHETERIZATION  05/2014  . COLONOSCOPY W/ POLYPECTOMY  1997  . CYSTOSCOPY W/ STONE MANIPULATION  1980's and 1990's X 4  . HERNIA REPAIR    . LAPAROSCOPIC ASSISTED VENTRAL HERNIA REPAIR     "right above my bellybutton"  . LEFT HEART CATHETERIZATION WITH CORONARY ANGIOGRAM N/A 06/11/2014   Procedure: LEFT HEART CATHETERIZATION WITH CORONARY ANGIOGRAM;  Surgeon: Blane Ohara, MD;  Location: [facility] LAB;  Service: Cardiovascular;  Laterality: N/A;  . LUMBAR DISC SURGERY  X 2   "#  1, #2"  . LUMBAR SPINE HARDWARE REMOVAL     "#4"  . POSTERIOR LUMBAR FUSION     "#3"  . VENTRAL HERNIA REPAIR N/A 12/31/2020   Procedure: LAPAROSCOPIC VENTRAL HERNIA REPAIR WITH MESH;  Surgeon: Ralene Ok, MD;  [facility];  Service: General;  Laterality: N/A;  90/RM4    Current Medications: Current Meds  Medication Sig  . albuterol (PROVENTIL HFA;VENTOLIN HFA) 108 (90 BASE) MCG/ACT inhaler Inhale 2 puffs into the lungs every 6 (six) hours as needed for wheezing or shortness of breath.  Marland Kitchen amLODipine (NORVASC) 10 MG tablet Take 1 tablet (10 mg total) by mouth at bedtime.  Marland Kitchen aspirin EC 81 MG tablet Take 81 mg by mouth at bedtime.   Marland Kitchen azelastine (OPTIVAR) 0.05 % ophthalmic solution Place 1 drop into both eyes 2 (two) times daily as needed (burning eyes).  . clotrimazole-betamethasone (LOTRISONE) cream Apply 1 application topically daily as needed (eczema).  . metoprolol succinate (TOPROL-XL) 25 MG 24 hr tablet Take 1 tablet (25 mg total) by mouth daily.  . rosuvastatin (CRESTOR) 20 MG tablet Take 1 tablet (20 mg total) by mouth daily.     Allergies:   Patient has no known allergies.   Social History   Socioeconomic History  . Marital status: Divorced    Spouse name: Not on  file  . Number of children: Not on file  . Years of education: Not on file  . Highest education level: Not on file  Occupational History  . Not on file  Tobacco Use  . Smoking status: Current Every Day Smoker    Packs/day: 1.00    Years: 50.00    Pack years: 50.00    Types: Cigarettes  . Smokeless tobacco: Never Used  Vaping Use  . Vaping Use: Never used  Substance and Sexual Activity  . Alcohol use: No  . Drug use: No  . Sexual activity: Not on file  Other Topics Concern  . Not on file  Social History Narrative  . Not on file   Social Determinants of Health   Financial Resource Strain: Not on file  Food Insecurity: Not on file  Transportation Needs: Not on file  Physical Activity: Not  on file  Stress: Not on file  Social Connections: Not on file     Family History: The patient's family history includes Cancer in his brother and sister; Diabetes in his brother and sister; Heart attack in his father and mother; Heart disease in his father and sister; Heart disease (age of onset: 14) in his mother; Stroke in his brother and father.    ROS:   Please see the history of present illness.    All other systems reviewed and are negative.   EKGs/Labs/Other Studies Reviewed:    The following studies were reviewed today: As summarized above  EKG:  EKG is  ordered today.  The ekg ordered today demonstrates sinus rhythm at rate of 86 bpm, right bundle blanch block, PAC  Recent Labs: 12/30/2020: BUN 17; Creatinine, Ser 1.65; Hemoglobin 16.3; Platelets 265; Potassium 4.8; Sodium 140  Recent Lipid Panel    Component Value Date/Time   CHOL 127 12/24/2017 0837   TRIG 125 12/24/2017 0837   HDL 37 (L) 12/24/2017 0837   CHOLHDL 3.4 12/24/2017 0837   CHOLHDL 7.1 11/23/2009 0155   VLDL 64 (H) 11/23/2009 0155   LDLCALC 65 12/24/2017 0837   Physical Exam:    VS:  BP 116/88   Pulse 80   Ht 6' (1.829 m)   Wt 198 lb 12.8 oz (90.2 kg)   SpO2 95%   BMI 26.96 kg/m     Wt Readings from Last 3 Encounters:  01/14/21 198 lb 12.8 oz (90.2 kg)  12/31/20 198 lb (89.8 kg)  12/30/20 198 lb (89.8 kg)     GEN:  Well nourished, well developed in no acute distress HEENT: Normal NECK: No JVD; No carotid bruits LYMPHATICS: No lymphadenopathy CARDIAC: RRR, no murmurs, rubs, gallops RESPIRATORY:  Clear to auscultation without rales, wheezing or rhonchi  ABDOMEN: Soft, non-tender, non-distended MUSCULOSKELETAL:  No edema; No deformity  SKIN: Warm and dry NEUROLOGIC:  Alert and oriented x 3 PSYCHIATRIC:  Normal affect   ASSESSMENT AND PLAN:    1.  Hypertension Blood pressure stable and well-controlled on current medication.  2.  Hyperlipidemia Followed by PCP.  Most recent LDL 66 on  March 2021.  Continue Crestor.  3.  AAA s/p EVAR Followed by vascular  4. Tobacco abuse - Recommended cessation   Medication Adjustments/Labs and Tests Ordered: Current medicines are reviewed at length with the patient today.  Concerns regarding medicines are outlined above.  Orders Placed This Encounter  Procedures  . EKG 12-Lead   No orders of the defined types were placed in this encounter.   Patient Instructions  Medication Instructions:  Your physician recommends that you continue on your current medications as directed. Please refer to the Current Medication list given to you today.  *If you need a refill on your cardiac medications before your next appointment, please call your pharmacy*   Lab Work: None ordered  If you have labs (blood work) drawn today and your tests are completely normal, you will receive your results only by: Marland Kitchen MyChart Message (if you have MyChart) OR . A paper copy in the mail If you have any lab test that is abnormal or we need to change your treatment, we will call you to review the results.   Testing/Procedures: None ordered   Follow-Up: At [facility], you and your health needs are our priority.  As part of our continuing mission to provide you with exceptional heart care, we have created designated Provider Care Teams.  These Care Teams include your primary Cardiologist (physician) and Advanced Practice Providers (APPs -  Physician Assistants and Nurse Practitioners) who all work together to provide you with the care you need, when you need it.  We recommend signing up for the patient portal called "MyChart".  Sign up information is provided on this After Visit Summary.  MyChart is used to connect with patients for Virtual Visits (Telemedicine).  Patients are able to view lab/test results, encounter notes, upcoming appointments, etc.  Non-urgent messages can be sent to your provider as well.   To learn more about what you can do with  MyChart, go to [email or web address].    Your next appointment:   12 month(s)  The format for your next appointment:   In Person  Provider:   You may see [name], MD or one of the following Advanced Practice Providers on your designated Care Team:    Richardson Dopp, PA-C  Robbie Lis, PA-C    Other Instructions      Signed, Leanor Kail, [place]  01/14/2021 9:16 AM    [place]

## 2021-02-10 ENCOUNTER — Other Ambulatory Visit: Payer: Self-pay | Admitting: Cardiovascular Disease

## 2021-02-10 DIAGNOSIS — E785 Hyperlipidemia, unspecified: Secondary | ICD-10-CM

## 2021-02-11 ENCOUNTER — Telehealth: Payer: Self-pay | Admitting: Cardiovascular Disease

## 2021-02-11 DIAGNOSIS — E785 Hyperlipidemia, unspecified: Secondary | ICD-10-CM

## 2021-02-11 MED ORDER — METOPROLOL SUCCINATE ER 25 MG PO TB24: 25.0000 mg | ORAL_TABLET | Freq: Every day | ORAL | 3 refills | Status: DC

## 2021-02-11 MED ORDER — ROSUVASTATIN CALCIUM 20 MG PO TABS: 20.0000 mg | ORAL_TABLET | Freq: Every day | ORAL | 3 refills | Status: DC

## 2021-02-11 NOTE — Telephone Encounter (Signed)
Pt's medications were sent to pt's pharmacy as requested. Confirmation received.  

## 2021-02-11 NOTE — Telephone Encounter (Signed)
*  STAT* If patient is at the pharmacy, call can be transferred to refill team.   1. Which medications need to be refilled? (please list name of each medication and dose if known)  metoprolol succinate (TOPROL-XL) 25 MG 24 hr tablet rosuvastatin (CRESTOR) 20 MG tablet  2. Which pharmacy/location (including street and city if local pharmacy) is medication to be sent to? [place], [place]  3. Do they need a 30 day or 90 day supply? 90 day  Patient has 1 tablet left.

## 2021-02-13 DIAGNOSIS — Z85828 Personal history of other malignant neoplasm of skin: Secondary | ICD-10-CM | POA: Diagnosis not present

## 2021-02-13 DIAGNOSIS — L820 Inflamed seborrheic keratosis: Secondary | ICD-10-CM | POA: Diagnosis not present

## 2021-02-13 DIAGNOSIS — Z08 Encounter for follow-up examination after completed treatment for malignant neoplasm: Secondary | ICD-10-CM | POA: Diagnosis not present

## 2021-03-20 DIAGNOSIS — Z131 Encounter for screening for diabetes mellitus: Secondary | ICD-10-CM | POA: Diagnosis not present

## 2021-03-20 DIAGNOSIS — Z13220 Encounter for screening for lipoid disorders: Secondary | ICD-10-CM | POA: Diagnosis not present

## 2021-03-20 DIAGNOSIS — Z6827 Body mass index (BMI) 27.0-27.9, adult: Secondary | ICD-10-CM | POA: Diagnosis not present

## 2021-03-20 DIAGNOSIS — E785 Hyperlipidemia, unspecified: Secondary | ICD-10-CM | POA: Diagnosis not present

## 2021-03-20 DIAGNOSIS — I10 Essential (primary) hypertension: Secondary | ICD-10-CM | POA: Diagnosis not present

## 2021-04-03 DIAGNOSIS — K1120 Sialoadenitis, unspecified: Secondary | ICD-10-CM | POA: Diagnosis not present

## 2021-04-03 DIAGNOSIS — J45909 Unspecified asthma, uncomplicated: Secondary | ICD-10-CM | POA: Diagnosis not present

## 2021-04-03 DIAGNOSIS — Z6828 Body mass index (BMI) 28.0-28.9, adult: Secondary | ICD-10-CM | POA: Diagnosis not present

## 2021-04-03 DIAGNOSIS — Z1331 Encounter for screening for depression: Secondary | ICD-10-CM | POA: Diagnosis not present

## 2021-04-03 DIAGNOSIS — E663 Overweight: Secondary | ICD-10-CM | POA: Diagnosis not present

## 2021-05-08 DIAGNOSIS — R319 Hematuria, unspecified: Secondary | ICD-10-CM | POA: Diagnosis not present

## 2021-05-08 DIAGNOSIS — G894 Chronic pain syndrome: Secondary | ICD-10-CM | POA: Diagnosis not present

## 2021-05-08 DIAGNOSIS — E663 Overweight: Secondary | ICD-10-CM | POA: Diagnosis not present

## 2021-05-08 DIAGNOSIS — E038 Other specified hypothyroidism: Secondary | ICD-10-CM | POA: Diagnosis not present

## 2021-05-08 DIAGNOSIS — Z1389 Encounter for screening for other disorder: Secondary | ICD-10-CM | POA: Diagnosis not present

## 2021-05-08 DIAGNOSIS — N200 Calculus of kidney: Secondary | ICD-10-CM | POA: Diagnosis not present

## 2021-05-08 DIAGNOSIS — I714 Abdominal aortic aneurysm, without rupture: Secondary | ICD-10-CM | POA: Diagnosis not present

## 2021-05-08 DIAGNOSIS — Z1331 Encounter for screening for depression: Secondary | ICD-10-CM | POA: Diagnosis not present

## 2021-05-08 DIAGNOSIS — I779 Disorder of arteries and arterioles, unspecified: Secondary | ICD-10-CM | POA: Diagnosis not present

## 2021-05-08 DIAGNOSIS — Z0001 Encounter for general adult medical examination with abnormal findings: Secondary | ICD-10-CM | POA: Diagnosis not present

## 2021-05-08 DIAGNOSIS — Z6828 Body mass index (BMI) 28.0-28.9, adult: Secondary | ICD-10-CM | POA: Diagnosis not present

## 2021-05-08 DIAGNOSIS — R7309 Other abnormal glucose: Secondary | ICD-10-CM | POA: Diagnosis not present

## 2021-05-08 DIAGNOSIS — I10 Essential (primary) hypertension: Secondary | ICD-10-CM | POA: Diagnosis not present

## 2021-05-08 DIAGNOSIS — E782 Mixed hyperlipidemia: Secondary | ICD-10-CM | POA: Diagnosis not present

## 2021-07-11 DIAGNOSIS — Z23 Encounter for immunization: Secondary | ICD-10-CM | POA: Diagnosis not present

## 2021-07-11 DIAGNOSIS — Z20828 Contact with and (suspected) exposure to other viral communicable diseases: Secondary | ICD-10-CM | POA: Diagnosis not present

## 2021-11-02 ENCOUNTER — Other Ambulatory Visit: Payer: Self-pay

## 2021-11-02 DIAGNOSIS — Z95828 Presence of other vascular implants and grafts: Secondary | ICD-10-CM

## 2021-11-10 ENCOUNTER — Inpatient Hospital Stay (HOSPITAL_COMMUNITY): Admission: RE | Admit: 2021-11-10 | Payer: Medicare Other | Source: Ambulatory Visit

## 2021-11-10 ENCOUNTER — Ambulatory Visit: Payer: Medicare Other

## 2022-01-05 ENCOUNTER — Ambulatory Visit (INDEPENDENT_AMBULATORY_CARE_PROVIDER_SITE_OTHER): Payer: Medicare Other | Admitting: Physician Assistant

## 2022-01-05 ENCOUNTER — Ambulatory Visit (HOSPITAL_COMMUNITY)
Admission: RE | Admit: 2022-01-05 | Discharge: 2022-01-05 | Disposition: A | Discharge status: Home / Self Care | Payer: Medicare Other | Source: Ambulatory Visit | Attending: Surgery | Admitting: Surgery

## 2022-01-05 VITALS — BP 128/80 | HR 70 | Temp 98.3°F | Resp 18 | Ht 72.0 in | Wt 180.0 lb

## 2022-01-05 DIAGNOSIS — Z95828 Presence of other vascular implants and grafts: Secondary | ICD-10-CM

## 2022-01-05 NOTE — Progress Notes (Signed)
[facility] [place] ?HISTORY AND PHYSICAL  ? ?History of Present Illness:  Patient is a 71 y.o. year old male who presents for evaluation of abdominal aortic aneurysm.  He is s/p EVAR repair 07/20/2014. ? He has been asymptomatic for Abd/Lumbar pain.  He denies claudication, rest pain or non healing wounds.  He has a sensation of right pectoralis "numbness/pain" for the past several days.  He also just loss his girlfriend after a long battle with Cancer.   ? He is medically managed on ASA and Crestor.   He continues to be a daily smoker  ? ?Past Medical History:  ?Diagnosis Date  ? Asthma   ? Cancer [facility])   ? skin  ? CHF (congestive heart failure) ([place])   ? Head trauma   ? High cholesterol   ? History of kidney stones   ? Hypertension   ? Peripheral vascular disease ([place])   ? Pneumonia   ? Pubic ramus fracture ([place]) 02/2000  ? left; thrown from tractor/notes 03/05/2017  ? ? ?Past Surgical History:  ?Procedure Laterality Date  ? ABDOMINAL AORTIC ENDOVASCULAR STENT GRAFT N/A 07/20/2014  ? Procedure: ABDOMINAL AORTIC ENDOVASCULAR STENT GRAFT;  Surgeon: Serafina Mitchell, MD;  Location: [facility];  Service: Vascular;  Laterality: N/A;  ? BACK SURGERY    ? BREAST SURGERY    ? CARDIAC CATHETERIZATION  05/2014  ? COLONOSCOPY W/ POLYPECTOMY  1997  ? CYSTOSCOPY W/ STONE MANIPULATION  1980's and 1990's X 4  ? HERNIA REPAIR    ? LAPAROSCOPIC ASSISTED VENTRAL HERNIA REPAIR    ? "right above my bellybutton"  ? LEFT HEART CATHETERIZATION WITH CORONARY ANGIOGRAM N/A 06/11/2014  ? Procedure: LEFT HEART CATHETERIZATION WITH CORONARY ANGIOGRAM;  Surgeon: Blane Ohara, MD;  Location: [facility] CATH LAB;  Service: Cardiovascular;  Laterality: N/A;  ? LUMBAR DISC SURGERY  X 2  ? "# 1, #2"  ? LUMBAR SPINE HARDWARE REMOVAL    ? "#4"  ? POSTERIOR LUMBAR FUSION    ? "#3"  ? VENTRAL HERNIA REPAIR N/A 12/31/2020  ? Procedure: LAPAROSCOPIC VENTRAL HERNIA REPAIR WITH MESH;  Surgeon: Ralene Ok, MD;  [facility];  Service:  General;  Laterality: N/A;  90/RM4  ? ? ? ?Social History ?Social History  ? ?Tobacco Use  ? Smoking status: Every Day  ?  Packs/day: 1.00  ?  Years: 50.00  ?  Pack years: 50.00  ?  Types: Cigarettes  ? Smokeless tobacco: Never  ?Vaping Use  ? Vaping Use: Never used  ?Substance Use Topics  ? Alcohol use: No  ? Drug use: No  ? ? ?Family History ?Family History  ?Problem Relation Age of Onset  ? Heart disease Mother 18  ?     Before age 47  ? Heart attack Mother   ? Heart attack Father   ? Stroke Father   ? Heart disease Father   ?     Before age 80  ? Cancer Sister   ?     Brain  ? Heart disease Sister   ?     Before age 22  ? Diabetes Sister   ? Cancer Brother   ?     Throat, Liver, Pancreatic  ? Stroke Brother   ? Diabetes Brother   ? ? ?Allergies ? ?No Known Allergies ? ? ?Current Outpatient Medications  ?Medication Sig Dispense Refill  ? albuterol (PROVENTIL HFA;VENTOLIN HFA) 108 (90 BASE) MCG/ACT inhaler Inhale 2 puffs into the  lungs every 6 (six) hours as needed for wheezing or shortness of breath.    ? amLODipine (NORVASC) 10 MG tablet Take 1 tablet (10 mg total) by mouth at bedtime. 90 tablet 3  ? aspirin EC 81 MG tablet Take 81 mg by mouth at bedtime.     ? azelastine (OPTIVAR) 0.05 % ophthalmic solution Place 1 drop into both eyes 2 (two) times daily as needed (burning eyes).    ? clotrimazole-betamethasone (LOTRISONE) cream Apply 1 application topically daily as needed (eczema).    ? metoprolol succinate (TOPROL-XL) 25 MG 24 hr tablet Take 1 tablet (25 mg total) by mouth daily. 90 tablet 3  ? rosuvastatin (CRESTOR) 20 MG tablet Take 1 tablet (20 mg total) by mouth daily. 90 tablet 3  ? ?No current facility-administered medications for this visit.  ? ? ?ROS:  ? ?General:  No weight loss, Fever, chills ? ?HEENT: No recent headaches, no nasal bleeding, no visual changes, no sore throat ? ?Neurologic: No dizziness, blackouts, seizures. No recent symptoms of stroke or mini- stroke. No recent episodes of slurred  speech, or temporary blindness. ? ?Cardiac: No recent episodes of chest pain/pressure, no shortness of breath at rest.  No shortness of breath with exertion.  Denies history of atrial fibrillation or irregular heartbeat ? ?Vascular: No history of rest pain in feet.  No history of claudication.  No history of non-healing ulcer, No history of DVT  ? ?Pulmonary: No home oxygen, no productive cough, no hemoptysis,  No asthma or wheezing ? ?Musculoskeletal:  '[ ]'$  Arthritis, '[ ]'$  Low back pain,  [x ] Joint pain ? ?Hematologic:No history of hypercoagulable state.  No history of easy bleeding.  No history of anemia ? ?Gastrointestinal: No hematochezia or melena,  No gastroesophageal reflux, no trouble swallowing ? ?Urinary: '[ ]'$  chronic Kidney disease, '[ ]'$  on HD - '[ ]'$  MWF or '[ ]'$  TTHS, '[ ]'$  Burning with urination, '[ ]'$  Frequent urination, '[ ]'$  Difficulty urinating;  ? ?Skin: No rashes ? ?Psychological: No history of anxiety,  No history of depression ? ? ?Physical Examination ? ?Vitals:  ? 01/05/22 0828  ?BP: 128/80  ?Pulse: 70  ?Resp: 18  ?Temp: 98.3 ?F (36.8 ?C)  ?TempSrc: Temporal  ?SpO2: 98%  ?Weight: 180 lb (81.6 kg)  ?Height: 6' (1.829 m)  ? ? ?Body mass index is 24.41 kg/m?. ? ?General:  Alert and oriented, no acute distress ?HEENT: Normal ?Neck: No bruit or JVD ?Pulmonary: Clear to auscultation bilaterally ?Cardiac: Regular Rate and Rhythm without murmur ?Gastrointestinal: Soft, non-tender, non-distended, no mass, no scars ?Skin: No rash ?Extremity Pulses:  2+ radial, brachial, femoral, dorsalis pedis,  pulses bilaterally ?Musculoskeletal: No deformity or edema  ?Neurologic: Upper and lower extremity motor 5/5 and symmetric ? ?DATA:  ? ?   ?Endovascular Aortic Repair (EVAR):  ?+----------+----------------+-------------------+-------------------+  ?          Diameter AP (cm)Diameter Trans (cm)Velocities (cm/sec)  ?+----------+----------------+-------------------+-------------------+  ?Aorta     3.94                                86                   ?+----------+----------------+-------------------+-------------------+  ?Right Limb1.54            1.58               62                   ?+----------+----------------+-------------------+-------------------+  ?  Left Limb 2.36            2.48               69                   ?+----------+----------------+-------------------+-------------------+  ? ? ?Summary:  ?Stenosis:  ?Very limited exam due to overlying bowel gas and discomfort. No evidence  ?of endoleak, however, this is based on limited visibility. Diameter  ?measurements essentially unchanged from the prior exam.  ? ?ASSESSMENT/PLAN: ?AAA s/p EVAR repair for 5 cm AAA.  Left ILIAC measuring 2.48 without change. ? Duplex with difficult visibility due to gas in bowel, but the AAA is measuring 3.94 without endo leak.   He will f/u in 1 year for repeat duplex.  If he develops sever Abdominal pain or lumbar pain he will call 911. ? He is asymptomatic for abdominal pain or lumbar pain.  The right pectoralis pain was not reproducible on exam.  He will f/u with his cardiologist for f/u since it has been 1.5 years ?He denies CP or SOB.  ? ? ? ? ?Roxy Horseman ?PA-C ?[facility] [place] ?Office: [phone] ? ?MD in clinic [place] ?

## 2022-01-22 DIAGNOSIS — E7849 Other hyperlipidemia: Secondary | ICD-10-CM | POA: Diagnosis not present

## 2022-01-22 DIAGNOSIS — E119 Type 2 diabetes mellitus without complications: Secondary | ICD-10-CM | POA: Diagnosis not present

## 2022-01-22 DIAGNOSIS — I10 Essential (primary) hypertension: Secondary | ICD-10-CM | POA: Diagnosis not present

## 2022-01-22 DIAGNOSIS — N183 Chronic kidney disease, stage 3 unspecified: Secondary | ICD-10-CM | POA: Diagnosis not present

## 2022-01-22 DIAGNOSIS — R319 Hematuria, unspecified: Secondary | ICD-10-CM | POA: Diagnosis not present

## 2022-01-22 DIAGNOSIS — I779 Disorder of arteries and arterioles, unspecified: Secondary | ICD-10-CM | POA: Diagnosis not present

## 2022-01-22 DIAGNOSIS — Z6825 Body mass index (BMI) 25.0-25.9, adult: Secondary | ICD-10-CM | POA: Diagnosis not present

## 2022-01-22 DIAGNOSIS — E782 Mixed hyperlipidemia: Secondary | ICD-10-CM | POA: Diagnosis not present

## 2022-01-22 DIAGNOSIS — E663 Overweight: Secondary | ICD-10-CM | POA: Diagnosis not present

## 2022-01-22 DIAGNOSIS — R7309 Other abnormal glucose: Secondary | ICD-10-CM | POA: Diagnosis not present

## 2022-01-22 DIAGNOSIS — N200 Calculus of kidney: Secondary | ICD-10-CM | POA: Diagnosis not present

## 2022-02-06 ENCOUNTER — Other Ambulatory Visit: Payer: Self-pay | Admitting: Cardiovascular Disease

## 2022-02-06 ENCOUNTER — Ambulatory Visit (INDEPENDENT_AMBULATORY_CARE_PROVIDER_SITE_OTHER): Payer: Medicare Other | Admitting: Cardiovascular Disease

## 2022-02-06 ENCOUNTER — Encounter: Payer: Self-pay | Admitting: Cardiovascular Disease

## 2022-02-06 VITALS — BP 120/80 | HR 65 | Ht 71.0 in | Wt 183.4 lb

## 2022-02-06 DIAGNOSIS — I7143 Infrarenal abdominal aortic aneurysm, without rupture: Secondary | ICD-10-CM | POA: Diagnosis not present

## 2022-02-06 DIAGNOSIS — E785 Hyperlipidemia, unspecified: Secondary | ICD-10-CM

## 2022-02-06 DIAGNOSIS — Z720 Tobacco use: Secondary | ICD-10-CM | POA: Diagnosis not present

## 2022-02-06 DIAGNOSIS — E782 Mixed hyperlipidemia: Secondary | ICD-10-CM | POA: Diagnosis not present

## 2022-02-06 NOTE — Patient Instructions (Signed)
Medication Instructions:  Your physician recommends that you continue on your current medications as directed. Please refer to the Current Medication list given to you today.  *If you need a refill on your cardiac medications before your next appointment, please call your pharmacy*   Lab Work: NONE If you have labs (blood work) drawn today and your tests are completely normal, you will receive your results only by: MyChart Message (if you have MyChart) OR A paper copy in the mail If you have any lab test that is abnormal or we need to change your treatment, we will call you to review the results.   Testing/Procedures: NONE   Follow-Up: At [facility], you and your health needs are our priority.  As part of our continuing mission to provide you with exceptional heart care, we have created designated Provider Care Teams.  These Care Teams include your primary Cardiologist (physician) and Advanced Practice Providers (APPs -  Physician Assistants and Nurse Practitioners) who all work together to provide you with the care you need, when you need it.  We recommend signing up for the patient portal called "MyChart".  Sign up information is provided on this After Visit Summary.  MyChart is used to connect with patients for Virtual Visits (Telemedicine).  Patients are able to view lab/test results, encounter notes, upcoming appointments, etc.  Non-urgent messages can be sent to your provider as well.   To learn more about what you can do with MyChart, go to [email or web address].    Your next appointment:   1 year(s)  The format for your next appointment:   In Person  Provider:   Michael Cooper, MD      Important Information About Sugar       

## 2022-02-06 NOTE — Progress Notes (Signed)
Cardiology Office Note:    Date:  02/06/2022   ID:  John Choi, DOB [birth year], MRN [id number]  PCP:  Sharilyn Sites, MD   [facility] Providers Cardiologist:  [name], MD     Referring MD: Sharilyn Sites, MD   Chief Complaint  Patient presents with   Hypertension    History of Present Illness:    John Choi is a 71 y.o. male with a hx of: Abdominal aortic aneurysm s/p EVAR in 2015 Cath 9/15: normal coronary arteries  Hypertension  Hyperlipidemia  Bifascicular block  Hx of tractor accident >> Pelvic and leg Fx Tobacco abuse   The patient is here alone today.  He has had a difficult year as his close friend died of colon cancer.  She lived with him over the last 6 months of her life and he took care of her along with hospice.  His personal health has been okay and he denies any chest pain, chest pressure, or shortness of breath.  He has had mild leg swelling but no major issues.  No orthopnea, PND, or heart palpitations.  He continues to smoke cigarettes about 1/2 pack/day.  Past Medical History:  Diagnosis Date   Asthma    Cancer ([place])    skin   CHF (congestive heart failure) (HCC)    Head trauma    High cholesterol    History of kidney stones    Hypertension    Peripheral vascular disease ([place])    Pneumonia    Pubic ramus fracture ([place]) 02/2000   left; thrown from tractor/notes 03/05/2017    Past Surgical History:  Procedure Laterality Date   ABDOMINAL AORTIC ENDOVASCULAR STENT GRAFT N/A 07/20/2014   Procedure: ABDOMINAL AORTIC ENDOVASCULAR STENT GRAFT;  Surgeon: Serafina Mitchell, MD;  Location: [place];  Service: Vascular;  Laterality: N/A;   BACK SURGERY     BREAST SURGERY     CARDIAC CATHETERIZATION  05/2014   COLONOSCOPY W/ POLYPECTOMY  1997   CYSTOSCOPY W/ STONE MANIPULATION  1980's and 1990's X 4   HERNIA REPAIR     LAPAROSCOPIC ASSISTED VENTRAL HERNIA REPAIR     "right above my bellybutton"   LEFT HEART CATHETERIZATION WITH CORONARY  ANGIOGRAM N/A 06/11/2014   Procedure: LEFT HEART CATHETERIZATION WITH CORONARY ANGIOGRAM;  Surgeon: Blane Ohara, MD;  Location: [facility] LAB;  Service: Cardiovascular;  Laterality: N/A;   LUMBAR DISC SURGERY  X 2   "# 1, #2"   LUMBAR SPINE HARDWARE REMOVAL     "#4"   POSTERIOR LUMBAR FUSION     "#3"   VENTRAL HERNIA REPAIR N/A 12/31/2020   Procedure: LAPAROSCOPIC VENTRAL HERNIA REPAIR WITH MESH;  Surgeon: Ralene Ok, MD;  Location: [facility];  Service: General;  Laterality: N/A;  90/RM4    Current Medications: Current Meds  Medication Sig   albuterol (PROVENTIL HFA;VENTOLIN HFA) 108 (90 BASE) MCG/ACT inhaler Inhale 2 puffs into the lungs every 6 (six) hours as needed for wheezing or shortness of breath.   amLODipine (NORVASC) 10 MG tablet Take 1 tablet (10 mg total) by mouth at bedtime.   aspirin EC 81 MG tablet Take 81 mg by mouth at bedtime.    azelastine (OPTIVAR) 0.05 % ophthalmic solution Place 1 drop into both eyes 2 (two) times daily as needed (burning eyes).   clotrimazole-betamethasone (LOTRISONE) cream Apply 1 application topically daily as needed (eczema).   metoprolol succinate (TOPROL-XL) 25 MG 24 hr tablet Take 1 tablet (25 mg total)  by mouth daily.   rosuvastatin (CRESTOR) 20 MG tablet Take 1 tablet (20 mg total) by mouth daily.     Allergies:   Patient has no known allergies.   Social History   Socioeconomic History   Marital status: Divorced    Spouse name: Not on file   Number of children: Not on file   Years of education: Not on file   Highest education level: Not on file  Occupational History   Not on file  Tobacco Use   Smoking status: Every Day    Packs/day: 1.00    Years: 50.00    Pack years: 50.00    Types: Cigarettes   Smokeless tobacco: Never  Vaping Use   Vaping Use: Never used  Substance and Sexual Activity   Alcohol use: No   Drug use: No   Sexual activity: Not on file  Other Topics Concern   Not on file  Social History Narrative    Not on file   Social Determinants of Health   Financial Resource Strain: Not on file  Food Insecurity: Not on file  Transportation Needs: Not on file  Physical Activity: Not on file  Stress: Not on file  Social Connections: Not on file     Family History: The patient's family history includes Cancer in his brother and sister; Diabetes in his brother and sister; Heart attack in his father and mother; Heart disease in his father and sister; Heart disease (age of onset: 83) in his mother; Stroke in his brother and father.  ROS:   Please see the history of present illness.    All other systems reviewed and are negative.  EKGs/Labs/Other Studies Reviewed:    The following studies were reviewed today: Post EVAR abdominal aortic ultrasound is reviewed from April 2023 notable for a limited exam due to bowel gas.  The aortic size had decreased and the left limb size had increased since the prior exam  EKG:  EKG is ordered today.  The ekg ordered today demonstrates NSR 65 bpm, RBBB, LAFB  Recent Labs: No results found for requested labs within last 8760 hours.  Recent Lipid Panel    Component Value Date/Time   CHOL 127 12/24/2017 0837   TRIG 125 12/24/2017 0837   HDL 37 (L) 12/24/2017 0837   CHOLHDL 3.4 12/24/2017 0837   CHOLHDL 7.1 11/23/2009 0155   VLDL 64 (H) 11/23/2009 0155   LDLCALC 65 12/24/2017 0837     Risk Assessment/Calculations:           Physical Exam:    VS:  BP 120/80   Pulse 65   Ht '5\' 11"'$  (1.803 m)   Wt 183 lb 6.4 oz (83.2 kg)   SpO2 95%   BMI 25.58 kg/m     Wt Readings from Last 3 Encounters:  02/06/22 183 lb 6.4 oz (83.2 kg)  01/05/22 180 lb (81.6 kg)  01/14/21 198 lb 12.8 oz (90.2 kg)     GEN:  Well nourished, well developed in no acute distress HEENT: Normal NECK: No JVD; No carotid bruits LYMPHATICS: No lymphadenopathy CARDIAC: RRR, no murmurs, rubs, gallops RESPIRATORY:  Clear to auscultation without rales, wheezing or rhonchi  ABDOMEN:  Soft, non-tender, non-distended MUSCULOSKELETAL:  No edema; No deformity  SKIN: Warm and dry NEUROLOGIC:  Alert and oriented x 3 PSYCHIATRIC:  Normal affect   ASSESSMENT:    1. Infrarenal abdominal aortic aneurysm (AAA) without rupture ([place])   2. Tobacco abuse   3. Mixed hyperlipidemia  PLAN:    In order of problems listed above:  Management per vascular surgery.  Patient follows regularly.  He is status post EVAR.  Treated medically with aspirin and a statin drug. Cessation counseling done.  He understands the importance of decreasing and ultimately stopping cigarette use, but lots of challenges with stress as he has been through. Treated with rosuvastatin.  Followed by his primary care physician.  Last available labs reviewed with a cholesterol 118, HDL 34, LDL 55.      Medication Adjustments/Labs and Tests Ordered: Current medicines are reviewed at length with the patient today.  Concerns regarding medicines are outlined above.  No orders of the defined types were placed in this encounter.  No orders of the defined types were placed in this encounter.   There are no Patient Instructions on file for this visit.   Signed, [name], MD  02/06/2022 9:49 AM    [place]

## 2022-02-19 DIAGNOSIS — J069 Acute upper respiratory infection, unspecified: Secondary | ICD-10-CM | POA: Diagnosis not present

## 2022-02-19 DIAGNOSIS — E663 Overweight: Secondary | ICD-10-CM | POA: Diagnosis not present

## 2022-02-19 DIAGNOSIS — Z6825 Body mass index (BMI) 25.0-25.9, adult: Secondary | ICD-10-CM | POA: Diagnosis not present

## 2022-06-12 ENCOUNTER — Encounter (HOSPITAL_COMMUNITY): Payer: Self-pay | Admitting: Radiology

## 2022-06-12 ENCOUNTER — Other Ambulatory Visit: Payer: Self-pay

## 2022-06-12 ENCOUNTER — Emergency Department (HOSPITAL_COMMUNITY): Payer: Medicare Other

## 2022-06-12 ENCOUNTER — Emergency Department (HOSPITAL_COMMUNITY)
Admission: EM | Admit: 2022-06-12 | Discharge: 2022-06-12 | Disposition: A | Discharge status: Home / Self Care | Payer: Medicare Other | Attending: Emergency Medicine | Admitting: Emergency Medicine

## 2022-06-12 DIAGNOSIS — S79912A Unspecified injury of left hip, initial encounter: Secondary | ICD-10-CM | POA: Diagnosis present

## 2022-06-12 DIAGNOSIS — M25552 Pain in left hip: Secondary | ICD-10-CM | POA: Diagnosis not present

## 2022-06-12 DIAGNOSIS — S32592A Other specified fracture of left pubis, initial encounter for closed fracture: Secondary | ICD-10-CM | POA: Diagnosis not present

## 2022-06-12 DIAGNOSIS — N183 Chronic kidney disease, stage 3 unspecified: Secondary | ICD-10-CM | POA: Diagnosis not present

## 2022-06-12 DIAGNOSIS — Z79899 Other long term (current) drug therapy: Secondary | ICD-10-CM | POA: Diagnosis not present

## 2022-06-12 DIAGNOSIS — W19XXXA Unspecified fall, initial encounter: Secondary | ICD-10-CM | POA: Diagnosis not present

## 2022-06-12 DIAGNOSIS — Z7982 Long term (current) use of aspirin: Secondary | ICD-10-CM | POA: Insufficient documentation

## 2022-06-12 DIAGNOSIS — F17200 Nicotine dependence, unspecified, uncomplicated: Secondary | ICD-10-CM | POA: Insufficient documentation

## 2022-06-12 DIAGNOSIS — M1612 Unilateral primary osteoarthritis, left hip: Secondary | ICD-10-CM | POA: Diagnosis not present

## 2022-06-12 DIAGNOSIS — S32502A Unspecified fracture of left pubis, initial encounter for closed fracture: Secondary | ICD-10-CM | POA: Insufficient documentation

## 2022-06-12 DIAGNOSIS — S32512A Fracture of superior rim of left pubis, initial encounter for closed fracture: Secondary | ICD-10-CM | POA: Diagnosis not present

## 2022-06-12 DIAGNOSIS — I5032 Chronic diastolic (congestive) heart failure: Secondary | ICD-10-CM | POA: Insufficient documentation

## 2022-06-12 DIAGNOSIS — Z043 Encounter for examination and observation following other accident: Secondary | ICD-10-CM | POA: Diagnosis not present

## 2022-06-12 DIAGNOSIS — Z5321 Procedure and treatment not carried out due to patient leaving prior to being seen by health care provider: Secondary | ICD-10-CM

## 2022-06-12 NOTE — ED Provider Notes (Signed)
[facility] EMERGENCY DEPARTMENT Provider Note   CSN: [id number] Arrival date & time: 06/12/22  1045     History  Chief Complaint  Patient presents with   Hip Pain    John Choi is a 71 y.o. male with history of AAA, HLD, tobacco abuse, chronic diastolic heart failure, stage 3 CKD who presents to the emergency department complaining of left hip pain after fall 2 days ago.  Fell directly onto his backside, more so on the left.  States that the pain is worse with weightbearing.  He has been using a walker, which he normally does not.  No other trauma from the fall. States his pain feels similarly to when he fractured his hip previously.    Hip Pain       Home Medications Prior to Admission medications   Medication Sig Start Date End Date Taking? Authorizing Provider  albuterol (PROVENTIL HFA;VENTOLIN HFA) 108 (90 BASE) MCG/ACT inhaler Inhale 2 puffs into the lungs every 6 (six) hours as needed for wheezing or shortness of breath.    [provider]  amLODipine (NORVASC) 10 MG tablet Take 1 tablet (10 mg total) by mouth at bedtime. 07/02/15   [name], MD  aspirin EC 81 MG tablet Take 81 mg by mouth at bedtime.     [provider]  azelastine (OPTIVAR) 0.05 % ophthalmic solution Place 1 drop into both eyes 2 (two) times daily as needed (burning eyes). 09/06/20   [provider]  clotrimazole-betamethasone (LOTRISONE) cream Apply 1 application topically daily as needed (eczema). 11/02/18   [provider]  metoprolol succinate (TOPROL-XL) 25 MG 24 hr tablet TAKE 1 TABLET BY MOUTH EVERY DAY 02/10/22   [name], MD  rosuvastatin (CRESTOR) 20 MG tablet TAKE 1 TABLET BY MOUTH EVERY DAY 02/10/22   [name], MD      Allergies    Patient has no known allergies.    Review of Systems   Review of Systems  Musculoskeletal:  Positive for arthralgias.  Neurological:  Negative for numbness.  All other systems reviewed and are  negative.   Physical Exam Updated Vital Signs BP 129/76   Pulse 70   Temp 98.4 F (36.9 C) (Oral)   Resp 16   Ht 6' (1.829 m)   Wt 86.4 kg   SpO2 95%   BMI 25.83 kg/m  Physical Exam Vitals and nursing note reviewed.  Constitutional:      Appearance: Normal appearance.  HENT:     Head: Normocephalic and atraumatic.  Eyes:     Conjunctiva/sclera: Conjunctivae normal.  Cardiovascular:     Pulses:          Posterior tibial pulses are 2+ on the right side and 2+ on the left side.  Pulmonary:     Effort: Pulmonary effort is normal. No respiratory distress.  Musculoskeletal:     Comments: Tenderness to palpation over the left posterior hip.  Unable to stand or ambulate without assistance.  Normal sensation.  Skin:    General: Skin is warm and dry.  Neurological:     Mental Status: He is alert.  Psychiatric:        Mood and Affect: Mood normal.        Behavior: Behavior normal.     ED Results / Procedures / Treatments   Labs (all labs ordered are listed, but only abnormal results are displayed) Labs Reviewed - No data to display  EKG None  Radiology CT Hip Left Wo  Contrast  Result Date: 06/12/2022 CLINICAL DATA:  Left hip pain since falling 2 days ago. Pain with weight-bearing. EXAM: CT OF THE LEFT HIP WITHOUT CONTRAST TECHNIQUE: Multidetector CT imaging of the left hip was performed according to the standard protocol. Multiplanar CT image reconstructions were also generated. RADIATION DOSE REDUCTION: This exam was performed according to the departmental dose-optimization program which includes automated exposure control, adjustment of the mA and/or kV according to patient size and/or use of iterative reconstruction technique. COMPARISON:  Radiographs earlier the same date. Pelvic CT 11/26/2020. FINDINGS: Bones/Joint/Cartilage The left femoral head is intact and located. No evidence of proximal femur fracture, dislocation or osteonecrosis. Mild left hip degenerative changes  without significant joint effusion. Posttraumatic deformity of the left pubic body and adjacent pubic rami. There is suspicion of an acute nondisplaced fracture of the inferior pubic ramus (most convincingly demonstrated on the coronal images). Possible nondisplaced fracture of the left superior pubic ramus which may extend into the anterior acetabulum. Ligaments Suboptimally assessed by CT. Muscles and Tendons Unremarkable. Soft tissues No periarticular hematoma or unexpected foreign body identified. Iliofemoral atherosclerosis and sigmoid diverticulosis are noted. IMPRESSION: 1. Probable acute nondisplaced fracture of the left superior and inferior pubic rami. The fracture of the superior pubic ramus may extend into the anterior acetabulum. 2. Posttraumatic deformity of the left parasymphyseal region. 3. No evidence of proximal femur fracture or dislocation. Electronically Signed   By: Richardean Sale M.D.   On: 06/12/2022 14:42   DG Hip Unilat W or Wo Pelvis 2-3 Views Left  Result Date: 06/12/2022 CLINICAL DATA:  Status post fall. EXAM: DG HIP (WITH OR WITHOUT PELVIS) 2-3V LEFT COMPARISON:  CT abdomen and pelvis 11/26/2020 FINDINGS: There is diffuse decreased bone mineralization. There is moderate irregularity again seen at the pubic symphysis, likely a remote healed fracture predominately involving the left pubic body and junction with the adjacent left superior pubic ramus. Mild bilateral femoroacetabular joint space narrowing and superolateral acetabular degenerative osteophytes. No definite acute fracture is seen. No dislocation. Partial visualization of lower lumbar spine postsurgical changes including posterior element osseous fusion. Partial visualization of bilateral common iliac vascular stents, corresponding to aorto bi-iliac grafts seen on prior CT. IMPRESSION: 1. Within the limitations of diffuse decreased bone mineralization, no acute fracture is seen. 2. Old healed fracture of the PICC body and  superior left pubic ramus. 3. Mild bilateral femoroacetabular osteoarthritis. Electronically Signed   By: Yvonne Kendall M.D.   On: 06/12/2022 11:57    Procedures Procedures    Medications Ordered in ED Medications - No data to display  ED Course/ Medical Decision Making/ A&P Clinical Course as of 06/12/22 1545  Fri Jun 12, 2022  1238 Patient told staff that he needed to leave by 1330 [LR]  1255 Patient states that he will stay for CT, but needs to leave by 1400.  Reported to myself "if I am still sitting here in this room by then, I am walking out". Encouraged him to stay for imaging and interpretation. [LR]  3818 Patient not found in his room.  CT was called, and states that they had finished his scan and brought him back to his room.  Determined patient had eloped. [LR]  [place] Dr Aline Brochure with orthopedics [LR]  1544 Called patient to discuss results and plan [LR]    Clinical Course User Index [LR] Gisella Alwine T, PA-C  Medical Decision Making Amount and/or Complexity of Data Reviewed Radiology: ordered.  Patient is a 71 year old male with history of AAA, HLD, tobacco abuse, chronic diastolic heart failure, stage 3 CKD who presents emergency department complaining of left hip pain after fall 2 days ago.  X-ray was ordered from triage and interpreted by myself which shows no acute fracture, evidence of prior fracture.  On exam patient has tenderness over the posterior left hip without palpable deformity.  Neurovascularly intact in bilateral lower extremities.  Unable to ambulate without assistance.  Due to point tenderness in inability to ambulate, felt it was necessary to order CT scan.  CT performed and interpreted which shows probable acute nondisplaced fracture of left superior and inferior pubic rami.  Upon waiting for radiology interpretation of imaging, patient had eloped from the department. Numerous attempts were made to locate him in  the department and in CT, and he was unable to be found.   After patient left I was able to consult orthopedist Dr Aline Brochure who reviewed patient's images and recommended non-operative management with weight bearing as tolerated and follow up in their clinic.   3:45pm Called patient to discuss his results and plan. Patient given contact information for orthopedics and recommendations. Dr Aline Brochure advised their office will call to schedule appointment next week. Patient agreeable to the plan. Would like to continue taking ibuprofen for pain. Given return precautions.  Final Clinical Impression(s) / ED Diagnoses Final diagnoses:  Left hip pain  Closed fracture of multiple pubic rami, left, initial encounter ([place])  Eloped from emergency department    Rx / DC Orders ED Discharge Orders     None      Portions of this report may have been transcribed using voice recognition software. Every effort was made to ensure accuracy; however, inadvertent computerized transcription errors may be present.    Estill Cotta 06/12/22 1545    Cristie Hem, MD 06/12/22 [phone]

## 2022-06-12 NOTE — ED Notes (Signed)
Patient transported to X-ray 

## 2022-06-12 NOTE — ED Notes (Signed)
Pt not located in room at 1355, not in either restroom or CT.  Pt had told EDP of leaving by 1400.

## 2022-06-12 NOTE — ED Triage Notes (Signed)
Pt in c/o L hip pain after fall x 2 days ago, pain with wt bearing, pt ambulatory with assistive device, takes ASA 81 mg daily, denies hitting head & LOC, A&O X4

## 2022-06-17 ENCOUNTER — Ambulatory Visit (INDEPENDENT_AMBULATORY_CARE_PROVIDER_SITE_OTHER): Payer: Medicare Other | Admitting: Orthopedic Surgery

## 2022-06-17 VITALS — BP 147/86 | HR 76 | Ht 72.0 in

## 2022-06-17 DIAGNOSIS — S73192A Other sprain of left hip, initial encounter: Secondary | ICD-10-CM

## 2022-06-17 NOTE — Progress Notes (Unsigned)
Emergency room follow-up  71 year old male with previous history of pelvic fracture fell when he was trying to water plants with his neighbor.  He injured his left hip.  He was seen in the emergency room plain films and CT scans were obtained he has a left pelvic fracture with extension into the left acetabulum with congruency of the femoral head and acetabulum  He is able to ambulate with a walker with minimal discomfort  Review of Systems  Constitutional:  Negative for fever.  Musculoskeletal:  Positive for joint pain.  Skin:  Negative for rash.  Neurological:  Negative for tingling.   Past Medical History:  Diagnosis Date   Asthma    Cancer ([place])    skin   CHF (congestive heart failure) (HCC)    Head trauma    High cholesterol    History of kidney stones    Hypertension    Peripheral vascular disease ([place])    Pneumonia    Pubic ramus fracture ([place]) 02/2000   left; thrown from tractor/notes 03/05/2017   Past Surgical History:  Procedure Laterality Date   ABDOMINAL AORTIC ENDOVASCULAR STENT GRAFT N/A 07/20/2014   Procedure: ABDOMINAL AORTIC ENDOVASCULAR STENT GRAFT;  Surgeon: Serafina Mitchell, MD;  Location: [place];  Service: Vascular;  Laterality: N/A;   BACK SURGERY     BREAST SURGERY     CARDIAC CATHETERIZATION  05/2014   COLONOSCOPY W/ POLYPECTOMY  1997   CYSTOSCOPY W/ STONE MANIPULATION  1980's and 1990's X 4   HERNIA REPAIR     LAPAROSCOPIC ASSISTED VENTRAL HERNIA REPAIR     "right above my bellybutton"   LEFT HEART CATHETERIZATION WITH CORONARY ANGIOGRAM N/A 06/11/2014   Procedure: LEFT HEART CATHETERIZATION WITH CORONARY ANGIOGRAM;  Surgeon: Blane Ohara, MD;  Location[facility] C[facility] LAB;  Service: Cardiovascular;  Laterality: N/A;   LUMBAR DISC SURGERY  X 2   "# 1, #2"   LUMBAR SPINE HARDWARE REMOVAL     "#4"   POSTERIOR LUMBAR FUSION     "#3"   VENTRAL HERNIA REPAIR N/A 12/31/2020   Procedure: LAPAROSCOPIC VENTRAL HERNIA REPAIR WITH MESH;  Surgeon: Ralene Ok,  MD;  Location: [facility];  Service: General;  Laterality: N/A;  90/RM4   Social History   Tobacco Use   Smoking status: Every Day    Packs/day: 1.00    Years: 50.00    Total pack years: 50.00    Types: Cigarettes   Smokeless tobacco: Never  Vaping Use   Vaping Use: Never used  Substance Use Topics   Alcohol use: No   Drug use: No   Family History  Problem Relation Age of Onset   Heart disease Mother 66       Before age 67   Heart attack Mother    Heart attack Father    Stroke Father    Heart disease Father        Before age 66   Cancer Sister        Brain   Heart disease Sister        Before age 53   Diabetes Sister    Cancer Brother        Throat, Liver, Pancreatic   Stroke Brother    Diabetes Brother      Constitutional: Vital signs BP (!) 147/86   Pulse 76   Ht 6' (1.829 m)   BMI 25.83 kg/m     General appearance   nrml  Cardiovascular:  swelling no  varicosities  no  palpation of pulses    nrml  temperature nrml  edema  no tenderness  no  Lymph nodes  deferred  Skin normal   Neuro : coordination normal  deep tendon reflexes normal   sensation normal   Psych: alert and oriented x 3 depression no anxiety no agitation no  Musculoskeletal gait wbat with walker   Left hip normal rom with no pain    I reviewed the er record and I ve interpreted 2 sets of images:  XRays  CT  Imaging of the left hip and pelvis show old fractures but nothing new  CT scan shows pelvic rami fracture extension into the acetabulum no displacement hairline fractures are noted  Recommend weightbearing as tolerated for 6 weeks with a walker  Return for x-ray in 6 weeks  Transition to cane if x-rays show fracture healing

## 2022-06-26 DIAGNOSIS — Z23 Encounter for immunization: Secondary | ICD-10-CM | POA: Diagnosis not present

## 2022-07-09 DIAGNOSIS — Z23 Encounter for immunization: Secondary | ICD-10-CM | POA: Diagnosis not present

## 2022-07-28 ENCOUNTER — Telehealth: Payer: Self-pay | Admitting: Orthopedic Surgery

## 2022-07-28 NOTE — Telephone Encounter (Signed)
Patient called, he had a post-op appointment scheduled for tomorrow, but cancelled it stating that he is doing great, not using a cane or walker and is pain free.  He will call if he needs [place].

## 2022-07-29 ENCOUNTER — Encounter: Payer: Medicare Other | Admitting: Orthopedic Surgery

## 2022-08-05 DIAGNOSIS — C44722 Squamous cell carcinoma of skin of right lower limb, including hip: Secondary | ICD-10-CM | POA: Diagnosis not present

## 2023-01-05 DIAGNOSIS — R7309 Other abnormal glucose: Secondary | ICD-10-CM | POA: Diagnosis not present

## 2023-01-05 DIAGNOSIS — E119 Type 2 diabetes mellitus without complications: Secondary | ICD-10-CM | POA: Diagnosis not present

## 2023-01-05 DIAGNOSIS — E663 Overweight: Secondary | ICD-10-CM | POA: Diagnosis not present

## 2023-01-05 DIAGNOSIS — E782 Mixed hyperlipidemia: Secondary | ICD-10-CM | POA: Diagnosis not present

## 2023-01-05 DIAGNOSIS — Z6826 Body mass index (BMI) 26.0-26.9, adult: Secondary | ICD-10-CM | POA: Diagnosis not present

## 2023-01-05 DIAGNOSIS — Z0001 Encounter for general adult medical examination with abnormal findings: Secondary | ICD-10-CM | POA: Diagnosis not present

## 2023-01-05 DIAGNOSIS — Z125 Encounter for screening for malignant neoplasm of prostate: Secondary | ICD-10-CM | POA: Diagnosis not present

## 2023-01-05 DIAGNOSIS — Z1331 Encounter for screening for depression: Secondary | ICD-10-CM | POA: Diagnosis not present

## 2023-01-05 DIAGNOSIS — I10 Essential (primary) hypertension: Secondary | ICD-10-CM | POA: Diagnosis not present

## 2023-01-05 DIAGNOSIS — E7849 Other hyperlipidemia: Secondary | ICD-10-CM | POA: Diagnosis not present

## 2023-01-06 ENCOUNTER — Other Ambulatory Visit: Payer: Self-pay | Admitting: *Deleted

## 2023-01-06 DIAGNOSIS — Z95828 Presence of other vascular implants and grafts: Secondary | ICD-10-CM

## 2023-01-18 ENCOUNTER — Ambulatory Visit (INDEPENDENT_AMBULATORY_CARE_PROVIDER_SITE_OTHER): Payer: Medicare Other | Admitting: Physician Assistant

## 2023-01-18 ENCOUNTER — Ambulatory Visit (HOSPITAL_COMMUNITY)
Admission: RE | Admit: 2023-01-18 | Discharge: 2023-01-18 | Disposition: A | Discharge status: Home / Self Care | Payer: Medicare Other | Source: Ambulatory Visit | Attending: Surgery | Admitting: Surgery

## 2023-01-18 VITALS — BP 130/82 | HR 76 | Temp 98.0°F | Resp 16 | Ht 71.0 in | Wt 193.0 lb

## 2023-01-18 DIAGNOSIS — Z95828 Presence of other vascular implants and grafts: Secondary | ICD-10-CM | POA: Diagnosis not present

## 2023-01-18 DIAGNOSIS — F17200 Nicotine dependence, unspecified, uncomplicated: Secondary | ICD-10-CM | POA: Diagnosis not present

## 2023-01-18 DIAGNOSIS — R600 Localized edema: Secondary | ICD-10-CM
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
## 2023-01-25 ENCOUNTER — Other Ambulatory Visit: Payer: Self-pay

## 2023-01-25 DIAGNOSIS — Z95828 Presence of other vascular implants and grafts: Secondary | ICD-10-CM
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
## 2023-02-10 ENCOUNTER — Ambulatory Visit: Payer: Medicare Other | Attending: Physician Assistant | Admitting: Physician Assistant

## 2023-02-10 ENCOUNTER — Encounter: Payer: Self-pay | Admitting: Physician Assistant

## 2023-02-10 VITALS — BP 116/70 | HR 80 | Ht 72.0 in | Wt 196.4 lb

## 2023-02-10 DIAGNOSIS — Z720 Tobacco use: Secondary | ICD-10-CM

## 2023-02-10 DIAGNOSIS — I824Z2 Acute embolism and thrombosis of unspecified deep veins of left distal lower extremity: Secondary | ICD-10-CM | POA: Diagnosis not present

## 2023-02-10 DIAGNOSIS — R600 Localized edema: Secondary | ICD-10-CM | POA: Diagnosis not present

## 2023-02-10 DIAGNOSIS — I7143 Infrarenal abdominal aortic aneurysm, without rupture: Secondary | ICD-10-CM

## 2023-02-10 DIAGNOSIS — N1831 Chronic kidney disease, stage 3a: Secondary | ICD-10-CM

## 2023-02-10 DIAGNOSIS — M79605 Pain in left leg: Secondary | ICD-10-CM

## 2023-02-10 DIAGNOSIS — I10 Essential (primary) hypertension: Secondary | ICD-10-CM | POA: Diagnosis not present

## 2023-02-10 DIAGNOSIS — E782 Mixed hyperlipidemia: Secondary | ICD-10-CM | POA: Diagnosis not present

## 2023-02-10 MED ORDER — AMLODIPINE BESYLATE 5 MG PO TABS: 5.0000 mg | ORAL_TABLET | Freq: Every day | ORAL | 3 refills | Status: DC

## 2023-02-10 NOTE — Assessment & Plan Note (Signed)
He does not really have a risk factor for DVT. He does have some discomfort associated with his edema. I will get a venous duplex to r/o DVT. If this is ok, he can wear compression stockings. I am hesitant to give him diuretics with his hx of chronic kidney disease and no symptoms of volume overload. He may be experiencing edema as a side effect from Amlodipine. It would be unusual to have just one leg involved. But I will reduce his Amlodipine to 5 mg once daily to see if this helps.

## 2023-02-10 NOTE — Assessment & Plan Note (Signed)
Status post EVAR in 2015.  He is followed by vascular surgery.

## 2023-02-10 NOTE — Assessment & Plan Note (Signed)
Labs from primary care reviewed.  LDL in April 2024 was optimal at 70.  Continue Crestor 20 mg daily.

## 2023-02-10 NOTE — Patient Instructions (Signed)
Medication Instructions:  Your physician has recommended you make the following change in your medication:   REDUCE Amlodipine to 5 mg taking 1 daily  *If you need a refill on your cardiac medications before your next appointment, please call your pharmacy*   Lab Work: None ordered  If you have labs (blood work) drawn today and your tests are completely normal, you will receive your results only by: MyChart Message (if you have MyChart) OR A paper copy in the mail If you have any lab test that is abnormal or we need to change your treatment, we will call you to review the results.   Testing/Procedures: Your physician has requested that you have a lower or upper extremity venous duplex AT [facility].  This test is an ultrasound of the veins in the legs or arms. It looks at venous blood flow that carries blood from the heart to the legs or arms. Allow one hour for a Lower Venous exam. Allow thirty minutes for an Upper Venous exam. There are no restrictions or special instructions.     Follow-Up: At [facility], you and your health needs are our priority.  As part of our continuing mission to provide you with exceptional heart care, we have created designated Provider Care Teams.  These Care Teams include your primary Cardiologist (physician) and Advanced Practice Providers (APPs -  Physician Assistants and Nurse Practitioners) who all work together to provide you with the care you need, when you need it.  We recommend signing up for the patient portal called "MyChart".  Sign up information is provided on this After Visit Summary.  MyChart is used to connect with patients for Virtual Visits (Telemedicine).  Patients are able to view lab/test results, encounter notes, upcoming appointments, etc.  Non-urgent messages can be sent to your provider as well.   To learn more about what you can do with MyChart, go to [email or web address].    Your next appointment:   1  year(s)  Provider:   Tonny Bollman, MD     Other Instructions Your physician has requested that you regularly monitor and record your blood pressure readings at home. Please use the same machine at the same time of day to check your readings and record them to bring to your follow-up visit.   Please monitor blood pressures and keep a log of your readings for 1 week.   YOUR BLOOD PRESSURE GOAL IS 130/80 OR LESS  Make sure to check 2 hours after your medications.    AVOID these things for 30 minutes before checking your blood pressure: No Drinking caffeine. No Drinking alcohol. No Eating. No Smoking. No Exercising.   Five minutes before checking your blood pressure: Pee. Sit in a dining chair. Avoid sitting in a soft couch or armchair. Be quiet. Do not talk

## 2023-02-10 NOTE — Assessment & Plan Note (Signed)
Blood pressure is well-controlled.  As noted, I will reduce his amlodipine to see if this helps his edema.  I have asked him to monitor his blood pressure over the next couple weeks and send those to me for review.  If his edema improves and his blood pressure increases, we may need to increase his metoprolol versus adding ACE/ARB.

## 2023-02-10 NOTE — Assessment & Plan Note (Signed)
He had a friend passed away recently.  It has been difficult for him to quit.

## 2023-02-10 NOTE — Assessment & Plan Note (Signed)
Recent creatinine stable. 

## 2023-02-12 ENCOUNTER — Telehealth: Payer: Self-pay | Admitting: Cardiovascular Disease

## 2023-02-12 ENCOUNTER — Ambulatory Visit (HOSPITAL_COMMUNITY)
Admission: RE | Admit: 2023-02-12 | Discharge: 2023-02-12 | Disposition: A | Discharge status: Home / Self Care | Payer: Medicare Other | Source: Ambulatory Visit | Attending: Physician Assistant | Admitting: Physician Assistant

## 2023-02-12 ENCOUNTER — Encounter (HOSPITAL_COMMUNITY): Payer: Medicare Other

## 2023-02-12 DIAGNOSIS — E782 Mixed hyperlipidemia: Secondary | ICD-10-CM | POA: Insufficient documentation

## 2023-02-12 DIAGNOSIS — M79605 Pain in left leg: Secondary | ICD-10-CM | POA: Diagnosis not present

## 2023-02-12 DIAGNOSIS — R600 Localized edema: Secondary | ICD-10-CM | POA: Diagnosis not present

## 2023-02-12 NOTE — Telephone Encounter (Signed)
Patient is returning call. Requesting return call.  

## 2023-02-12 NOTE — Telephone Encounter (Signed)
The patient has been notified of the result and verbalized understanding.  All questions (if any) were answered. Asencion Gowda, LPN 1/61/0960 4:54 PM

## 2023-04-06 ENCOUNTER — Emergency Department (HOSPITAL_COMMUNITY): Payer: Medicare Other

## 2023-04-06 ENCOUNTER — Other Ambulatory Visit: Payer: Self-pay

## 2023-04-06 ENCOUNTER — Emergency Department (HOSPITAL_COMMUNITY)
Admission: EM | Admit: 2023-04-06 | Discharge: 2023-04-06 | Disposition: A | Discharge status: Home / Self Care | Payer: Medicare Other | Attending: Emergency Medicine | Admitting: Emergency Medicine

## 2023-04-06 ENCOUNTER — Encounter (HOSPITAL_COMMUNITY): Payer: Self-pay | Admitting: Emergency Medicine

## 2023-04-06 DIAGNOSIS — Z79899 Other long term (current) drug therapy: Secondary | ICD-10-CM | POA: Diagnosis not present

## 2023-04-06 DIAGNOSIS — Z7982 Long term (current) use of aspirin: Secondary | ICD-10-CM | POA: Insufficient documentation

## 2023-04-06 DIAGNOSIS — Z720 Tobacco use: Secondary | ICD-10-CM | POA: Diagnosis not present

## 2023-04-06 DIAGNOSIS — R109 Unspecified abdominal pain: Secondary | ICD-10-CM | POA: Diagnosis present

## 2023-04-06 DIAGNOSIS — R1011 Right upper quadrant pain: Secondary | ICD-10-CM | POA: Diagnosis not present

## 2023-04-06 DIAGNOSIS — I129 Hypertensive chronic kidney disease with stage 1 through stage 4 chronic kidney disease, or unspecified chronic kidney disease: Secondary | ICD-10-CM | POA: Diagnosis not present

## 2023-04-06 DIAGNOSIS — N189 Chronic kidney disease, unspecified: Secondary | ICD-10-CM | POA: Diagnosis not present

## 2023-04-06 DIAGNOSIS — K8020 Calculus of gallbladder without cholecystitis without obstruction: Secondary | ICD-10-CM | POA: Diagnosis not present

## 2023-04-06 LAB — URINALYSIS, ROUTINE W REFLEX MICROSCOPIC
Bacteria, UA: NONE SEEN
Bilirubin Urine: NEGATIVE
Glucose, UA: NEGATIVE mg/dL
Hgb urine dipstick: NEGATIVE
Ketones, ur: NEGATIVE mg/dL
Leukocytes,Ua: NEGATIVE
Nitrite: NEGATIVE
Protein, ur: 100 mg/dL — AB
Specific Gravity, Urine: 1.016 (ref 1.005–1.030)
pH: 6 (ref 5.0–8.0)

## 2023-04-06 LAB — CBC
HCT: 49.8 % (ref 39.0–52.0)
Hemoglobin: 16.7 g/dL (ref 13.0–17.0)
MCH: 31.2 pg (ref 26.0–34.0)
MCHC: 33.5 g/dL (ref 30.0–36.0)
MCV: 92.9 fL (ref 80.0–100.0)
Platelets: 242 10*3/uL (ref 150–400)
RBC: 5.36 MIL/uL (ref 4.22–5.81)
RDW: 13.9 % (ref 11.5–15.5)
WBC: 6.2 10*3/uL (ref 4.0–10.5)
nRBC: 0 % (ref 0.0–0.2)

## 2023-04-06 LAB — COMPREHENSIVE METABOLIC PANEL
ALT: 18 U/L (ref 0–44)
AST: 22 U/L (ref 15–41)
Albumin: 3.6 g/dL (ref 3.5–5.0)
Alkaline Phosphatase: 86 U/L (ref 38–126)
Anion gap: 10 (ref 5–15)
BUN: 17 mg/dL (ref 8–23)
CO2: 23 mmol/L (ref 22–32)
Calcium: 8.8 mg/dL — ABNORMAL LOW (ref 8.9–10.3)
Chloride: 106 mmol/L (ref 98–111)
Creatinine, Ser: 1.53 mg/dL — ABNORMAL HIGH (ref 0.61–1.24)
GFR, Estimated: 48 mL/min — ABNORMAL LOW (ref >60)
Glucose, Bld: 99 mg/dL (ref 70–99)
Potassium: 4.2 mmol/L (ref 3.5–5.1)
Sodium: 139 mmol/L (ref 135–145)
Total Bilirubin: 0.4 mg/dL (ref 0.3–1.2)
Total Protein: 7.2 g/dL (ref 6.5–8.1)

## 2023-04-06 LAB — LIPASE, BLOOD: Lipase: 38 U/L (ref 11–51)

## 2023-04-06 MED ORDER — ONDANSETRON HCL 4 MG PO TABS: 4.0000 mg | ORAL_TABLET | Freq: Four times a day (QID) | ORAL | 0 refills | Status: AC

## 2023-04-06 MED ORDER — ONDANSETRON 4 MG PO TBDP
4.0000 mg | ORAL_TABLET | Freq: Once | ORAL | Status: AC | PRN
  Administered 2023-04-06: 4 mg via ORAL

## 2023-04-06 MED ORDER — ONDANSETRON HCL 4 MG/2ML IJ SOLN
4.0000 mg | Freq: Once | INTRAMUSCULAR | Status: AC
  Administered 2023-04-06: 4 mg via INTRAVENOUS
  Filled 2023-04-06: qty 2

## 2023-04-06 MED ORDER — ONDANSETRON HCL 4 MG PO TABS: 4.0000 mg | ORAL_TABLET | Freq: Four times a day (QID) | ORAL | 0 refills | Status: DC

## 2023-04-06 NOTE — Discharge Instructions (Addendum)
You were seen in the emergency department for your abdominal pain.  Your workup did show that you have gallstones but no signs of gallbladder an infection or blockages of your stones.  You can continue to take Tylenol every 6 hours as needed for pain and I have given you Zofran to take as needed for nausea.  You can follow-up with your surgeon to have your symptoms rechecked and to determine if you need to have your gallbladder removed.  You should return to the emergency department if you have significantly worsening pain, repetitive vomiting despite the nausea medicine, fevers or any other new or concerning symptoms.

## 2023-04-06 NOTE — ED Provider Notes (Signed)
[place] EMERGENCY DEPARTMENT AT [facility] Provider Note   CSN: [id number] Arrival date & time: 04/06/23  5784     History  Chief Complaint  Patient presents with   Abdominal Pain    John Choi is a 72 y.o. male.  Patient is a 72 year old male with a past medical history of hypertension, AAA status post repair, CKD and tobacco use presenting to the emergency department with abdominal pain.  Patient reports that several years ago he had an ultrasound done with his primary doctor during one of his annual visits that showed that he had gallstones.  He states that he saw surgeon at that time but was not recommended for surgery as he did not have significant pain.  He states over the weekend he started to have increasing right upper quadrant pain.  He states that the pain comes and goes and is worse with eating.  States he has associated nausea and vomiting.  He denies any known fevers but states that he has had night sweats.  He denies any diarrhea or constipation, dysuria or hematuria.  He denies any associated chest pain, cough or shortness of breath.  The history is provided by the patient.  Abdominal Pain      Home Medications Prior to Admission medications   Medication Sig Start Date End Date Taking? Authorizing Provider  ondansetron (ZOFRAN) 4 MG tablet Take 1 tablet (4 mg total) by mouth every 6 (six) hours. 04/06/23  Yes Theresia Lo, Benetta Spar K, DO  albuterol (PROVENTIL HFA;VENTOLIN HFA) 108 (90 BASE) MCG/ACT inhaler Inhale 2 puffs into the lungs every 6 (six) hours as needed for wheezing or shortness of breath.    [provider]  amLODipine (NORVASC) 5 MG tablet Take 1 tablet (5 mg total) by mouth daily. 02/10/23 05/11/23  [name] T, PA-C  aspirin EC 81 MG tablet Take 81 mg by mouth at bedtime.     [provider]  azelastine (OPTIVAR) 0.05 % ophthalmic solution Place 1 drop into both eyes 2 (two) times daily as needed (burning eyes).  09/06/20   [provider]  clotrimazole-betamethasone (LOTRISONE) cream Apply 1 application  topically daily as needed (eczema). 11/02/18   [provider]  metoprolol succinate (TOPROL-XL) 25 MG 24 hr tablet TAKE 1 TABLET BY MOUTH EVERY DAY 02/10/22   Tonny Bollman, MD  rosuvastatin (CRESTOR) 20 MG tablet TAKE 1 TABLET BY MOUTH EVERY DAY 02/10/22   Tonny Bollman, MD      Allergies    Patient has no known allergies.    Review of Systems   Review of Systems  Gastrointestinal:  Positive for abdominal pain.    Physical Exam Updated Vital Signs BP (!) 138/90   Pulse 70   Temp 98 F (36.7 C)   Resp 15   Ht 6' (1.829 m)   Wt 87.1 kg   SpO2 94%   BMI 26.04 kg/m  Physical Exam Vitals and nursing note reviewed.  Constitutional:      General: He is not in acute distress.    Appearance: He is well-developed.  HENT:     Head: Normocephalic and atraumatic.     Mouth/Throat:     Mouth: Mucous membranes are moist.  Eyes:     Extraocular Movements: Extraocular movements intact.  Cardiovascular:     Rate and Rhythm: Normal rate and regular rhythm.     Heart sounds: Normal heart sounds.  Pulmonary:     Effort: Pulmonary effort is normal.  Breath sounds: Normal breath sounds.  Abdominal:     General: Abdomen is flat.     Palpations: Abdomen is soft.     Tenderness: There is abdominal tenderness in the right lower quadrant. There is guarding. There is no rebound.  Skin:    General: Skin is warm and dry.  Neurological:     General: No focal deficit present.     Mental Status: He is alert and oriented to person, place, and time.  Psychiatric:        Mood and Affect: Mood normal.        Behavior: Behavior normal.     ED Results / Procedures / Treatments   Labs (all labs ordered are listed, but only abnormal results are displayed) Labs Reviewed  COMPREHENSIVE METABOLIC PANEL - Abnormal; Notable for the following components:      Result Value    Creatinine, Ser 1.53 (*)    Calcium 8.8 (*)    GFR, Estimated 48 (*)    All other components within normal limits  URINALYSIS, ROUTINE W REFLEX MICROSCOPIC - Abnormal; Notable for the following components:   Protein, ur 100 (*)    All other components within normal limits  LIPASE, BLOOD  CBC    EKG None  Radiology US Abdomen Limited RUQ (LIVER/GB)  Result Date: 04/06/2023 CLINICAL DATA:  [id number] RUQ discomfort [id number]. EXAM: ULTRASOUND ABDOMEN LIMITED RIGHT UPPER QUADRANT COMPARISON:  CT abdomen/pelvis 11/26/2020. FINDINGS: Gallbladder: Layering echogenic material with posterior acoustic shadowing, likely tiny gallstones. No wall thickening visualized. No sonographic Murphy sign noted by sonographer. Common bile duct: Diameter: 3.7 mm. Liver: No focal lesion identified. Within normal limits in parenchymal echogenicity. Portal vein is patent on color Doppler imaging with normal direction of blood flow towards the liver. Other: None. IMPRESSION: Cholelithiasis without evidence of acute cholecystitis. Electronically Signed   By: Orvan Falconer M.D.   On: 04/06/2023 12:07    Procedures Procedures    Medications Ordered in ED Medications  ondansetron (ZOFRAN-ODT) disintegrating tablet 4 mg (4 mg Oral Given 04/06/23 0941)  ondansetron (ZOFRAN) injection 4 mg (4 mg Intravenous Given 04/06/23 1125)    ED Course/ Medical Decision Making/ A&P Clinical Course as of 04/06/23 1251  Tue Apr 06, 2023  1227 RUQ [place] with cholelithiasis without cholecystitis, no evidence of biliary obstruction. Pain has been well controlled. He is stable for discharge with outpatient follow up. [VK]    Clinical Course User Index [VK] Rexford Maus, DO                             Medical Decision Making This patient presents to the ED with chief complaint(s) of right upper quadrant abdominal pain with pertinent past medical history of hypertension, AAA status post repair, tobacco use, CKD which further  complicates the presenting complaint. The complaint involves an extensive differential diagnosis and also carries with it a high risk of complications and morbidity.    The differential diagnosis includes cholelithiasis, cholecystitis, choledocholithiasis, pancreatitis, hepatitis, gastritis, GERD, no cough or shortness of breath making pleural effusion, pneumonia or other pulmonary abnormality unlikely  Additional history obtained: Additional history obtained from family Records reviewed outpatient cardiology and vascular records -AAA has been stable status post repair  ED Course and Reassessment: On patient's arrival to the emergency department he is hemodynamically stable in no acute distress.  He did have tenderness to the right upper quadrant.  Patient is given Zofran by triage  with improvement of his nausea and declines additional pain medication at this time.  Will have labs and right upper quadrant ultrasound performed to evaluate for cause of his symptoms and will be closely reassessed.  Independent labs interpretation:  The following labs were independently interpreted: at baseline/within normal range  Independent visualization of imaging: - I independently visualized the following imaging with scope of interpretation limited to determining acute life threatening conditions related to emergency care: RUQ [place], which revealed cholelithiasis without cholecystitis  Consultation: - Consulted or discussed management/test interpretation w/ external professional: N/A  Consideration for admission or further workup: Patient has no emergent conditions requiring admission or further work-up at this time and is stable for discharge home with general surgery follow-up  Social Determinants of health: N/A    Amount and/or Complexity of Data Reviewed Labs: ordered. Radiology: ordered.  Risk Prescription drug management.          Final Clinical Impression(s) / ED Diagnoses Final  diagnoses:  Calculus of gallbladder without cholecystitis without obstruction    Rx / DC Orders ED Discharge Orders          Ordered    ondansetron (ZOFRAN) 4 MG tablet  Every 6 hours        04/06/23 1249              Rexford Maus, DO 04/06/23 1251

## 2023-04-06 NOTE — ED Notes (Signed)
Ultrasound at bedside

## 2023-04-06 NOTE — ED Triage Notes (Signed)
Patient arrives ambulatory by POV c/o right upper quadrant pain and nausea since Saturday. States hx of gallstones.

## 2023-04-22 DIAGNOSIS — Z20828 Contact with and (suspected) exposure to other viral communicable diseases: Secondary | ICD-10-CM | POA: Diagnosis not present

## 2023-04-22 DIAGNOSIS — Z6827 Body mass index (BMI) 27.0-27.9, adult: Secondary | ICD-10-CM | POA: Diagnosis not present

## 2023-04-22 DIAGNOSIS — J069 Acute upper respiratory infection, unspecified: Secondary | ICD-10-CM | POA: Diagnosis not present

## 2023-04-22 DIAGNOSIS — E663 Overweight: Secondary | ICD-10-CM | POA: Diagnosis not present

## 2023-04-23 ENCOUNTER — Ambulatory Visit: Payer: Self-pay | Admitting: General Surgery

## 2023-04-23 ENCOUNTER — Telehealth: Payer: Self-pay

## 2023-04-23 DIAGNOSIS — K8020 Calculus of gallbladder without cholecystitis without obstruction: Secondary | ICD-10-CM | POA: Diagnosis not present

## 2023-04-23 NOTE — H&P (Signed)
Chief Complaint: New Consultation (Calculus of gallbladder without cholecystitis without obstruction)       History of Present Illness: John Choi is a 72 y.o. male who is seen today as an office consultation at the request of Dr. Room for evaluation of New Consultation (Calculus of gallbladder without cholecystitis without obstruction) .   Patient is a 72 year old male who comes in secondary to gallstones. Patient previously had a lap ventral hernia repair in 2022.   Patient states he was recently ER secondary to abdominal pain.  He states was right upper quadrant.  He states this was after eating a large amount of tomatoes.  He states that he has some nausea vomiting with it.  He has had some episodes thereafter.   Upon evaluation the ER he underwent ultrasound.  This was relevant for gallstones.  Patient with normal LFTs at that time.           Review of Systems: A complete review of systems was obtained from the patient.  I have reviewed this information and discussed as appropriate with the patient.  See HPI as well for other ROS.   Review of Systems  Constitutional:  Negative for fever.  HENT:  Negative for congestion.   Eyes:  Negative for blurred vision.  Respiratory:  Negative for cough, shortness of breath and wheezing.   Cardiovascular:  Negative for chest pain and palpitations.  Gastrointestinal:  Positive for abdominal pain, nausea and vomiting. Negative for heartburn.  Genitourinary:  Negative for dysuria.  Musculoskeletal:  Negative for myalgias.  Skin:  Negative for rash.  Neurological:  Negative for dizziness and headaches.  Psychiatric/Behavioral:  Negative for depression and suicidal ideas.   All other systems reviewed and are negative.       Medical History: Past Medical History      Past Medical History:  Diagnosis Date   Asthma, unspecified asthma severity, unspecified whether complicated, unspecified whether persistent (HHS-HCC)     COPD  (chronic obstructive pulmonary disease) (CMS/HHS-HCC)           Problem List  There is no problem list on file for this patient.      Past Surgical History       Past Surgical History:  Procedure Laterality Date   back surgery        x4   HERNIA REPAIR            Allergies  No Known Allergies     Medications Ordered Prior to Encounter        Current Outpatient Medications on File Prior to Visit  Medication Sig Dispense Refill   albuterol MDI, PROVENTIL, VENTOLIN, PROAIR, HFA 90 mcg/actuation inhaler Inhale 2 Inhalations into the lungs every 6 (six) hours as needed       amLODIPine (NORVASC) 5 MG tablet Take 5 mg by mouth once daily       aspirin 81 MG EC tablet Take 81 mg by mouth at bedtime       metoprolol succinate (TOPROL-XL) 25 MG XL tablet Take 12.5 mg by mouth once daily       rosuvastatin (CRESTOR) 20 MG tablet Take 20 mg by mouth once daily        No current facility-administered medications on file prior to visit.        Family History       Family History  Problem Relation Age of Onset   High blood pressure (Hypertension) Mother     Hyperlipidemia (Elevated cholesterol) Mother  Coronary Artery Disease (Blocked arteries around heart) Mother     Skin cancer Father     Hyperlipidemia (Elevated cholesterol) Father     High blood pressure (Hypertension) Father     Coronary Artery Disease (Blocked arteries around heart) Father     Skin cancer Sister     High blood pressure (Hypertension) Sister     Hyperlipidemia (Elevated cholesterol) Sister     Coronary Artery Disease (Blocked arteries around heart) Sister     Hyperlipidemia (Elevated cholesterol) Brother     High blood pressure (Hypertension) Brother     Coronary Artery Disease (Blocked arteries around heart) Brother          Tobacco Use History  Social History        Tobacco Use  Smoking Status Every Day   Types: Cigarettes  Smokeless Tobacco Never        Social History  Social History          Socioeconomic History   Marital status: Divorced  Tobacco Use   Smoking status: Every Day      Types: Cigarettes   Smokeless tobacco: Never  Vaping Use   Vaping status: Never Used  Substance and Sexual Activity   Alcohol use: Yes   Drug use: Never        Objective:         Vitals:    04/23/23 1102  BP: 126/84  Pulse: 103  Temp: 36.7 C (98 F)  SpO2: 99%  Weight: 89.4 kg (197 lb)  Height: 182.9 cm (6')  PainSc: 0-No pain    Body mass index is 26.72 kg/m.   Physical Exam Constitutional:      General: He is not in acute distress.    Appearance: Normal appearance.  HENT:     Head: Normocephalic.     Nose: No rhinorrhea.     Mouth/Throat:     Mouth: Mucous membranes are moist.     Pharynx: Oropharynx is clear.  Eyes:     General: No scleral icterus.    Pupils: Pupils are equal, round, and reactive to light.  Cardiovascular:     Rate and Rhythm: Normal rate.     Pulses: Normal pulses.  Pulmonary:     Effort: Pulmonary effort is normal. No respiratory distress.     Breath sounds: No stridor. No wheezing.  Abdominal:     General: Abdomen is flat. There is no distension.     Tenderness: There is no abdominal tenderness. There is no guarding or rebound.  Musculoskeletal:        General: Normal range of motion.     Cervical back: Normal range of motion and neck supple.  Skin:    General: Skin is warm and dry.     Capillary Refill: Capillary refill takes less than 2 seconds.     Coloration: Skin is not jaundiced.  Neurological:     General: No focal deficit present.     Mental Status: He is alert and oriented to person, place, and time. Mental status is at baseline.  Psychiatric:        Mood and Affect: Mood normal.        Thought Content: Thought content normal.        Judgment: Judgment normal.        Assessment and Plan:  Diagnoses and all orders for this visit:   Symptomatic cholelithiasis     John Choi is a 72 y.o. male  We  will proceed to the OR for a lap cholecystectomy. All risks and benefits were discussed with the patient to generally include: infection, bleeding, possible need for post op ERCP, damage to the bile ducts, and bile leak. Alternatives were offered and described.  All questions were answered and the patient voiced understanding of the procedure and wishes to proceed at this point with a laparoscopic cholecystectomy           No follow-ups on file.   Axel Filler, MD, [facility], [place] [facility]

## 2023-04-23 NOTE — Telephone Encounter (Signed)
Pts last OV was 02/10/23 with [name], PA-C.      Pre-operative Risk Assessment    Patient Name: John Choi  DOB: April 21, 1951 MRN: [id number]      Request for Surgical Clearance    Procedure:   Lap Cholecystectomy  Date of Surgery:  Clearance TBD                                 Surgeon:  Axel Filler, MD Surgeon's Group or Practice Name:  [facility] Phone number:  [phone] Fax number:  [phone] Brennan Bailey, CMA   Type of Clearance Requested:   - Medical  - Pharmacy:  Hold Aspirin pt will need instructions on when/if to hold   Type of Anesthesia:  General    Additional requests/questions:    SignedZada Finders   04/23/2023, 11:31 AM

## 2023-04-26 ENCOUNTER — Telehealth: Payer: Self-pay | Admitting: Cardiovascular Disease

## 2023-04-26 NOTE — Telephone Encounter (Signed)
   Name: John Choi  DOB: Apr 09, 1951  MRN: [id number]  Primary Cardiologist: Tonny Bollman, MD   Preoperative team, please contact this patient and set up a phone call appointment for further preoperative risk assessment. Please obtain consent and complete medication review. Thank you for your help.  I confirm that guidance regarding antiplatelet and oral anticoagulation therapy has been completed and, if necessary, noted below.  Ideally aspirin should be continued without interruption, however if the bleeding risk is too great, aspirin may be held for 5-7 days prior to surgery. Please resume aspirin post operatively when it is felt to be safe from a bleeding standpoint.     Carlos Levering, NP 04/26/2023, 3:40 PM [facility]

## 2023-04-26 NOTE — Telephone Encounter (Signed)
1st attempt to reach pt, left a message for pt to call back and ask for the preop team.

## 2023-04-26 NOTE — Telephone Encounter (Signed)
Pt returning call, he ask that you c/b tomorrow if possible

## 2023-04-26 NOTE — Telephone Encounter (Signed)
Returned call to pt, left another message for pt to call back.  

## 2023-04-26 NOTE — Telephone Encounter (Signed)
Pt returning nurses call regarding Clearance. Please advise 

## 2023-04-27 ENCOUNTER — Telehealth: Payer: Self-pay

## 2023-04-27 NOTE — Telephone Encounter (Signed)
Pt scheduled 08/08 at 320. Ok'd with DW. Med rec and consent done.    Patient Consent for Virtual Visit        John Choi has provided verbal consent on 04/27/2023 for a virtual visit (video or telephone).   CONSENT FOR VIRTUAL VISIT FOR:  John Choi  By participating in this virtual visit I agree to the following:  I hereby voluntarily request, consent and authorize [facility] and its employed or contracted physicians, physician assistants, nurse practitioners or other licensed health care professionals (the Practitioner), to provide me with telemedicine health care services (the "Services") as deemed necessary by the treating Practitioner. I acknowledge and consent to receive the Services by the Practitioner via telemedicine. I understand that the telemedicine visit will involve communicating with the Practitioner through live audiovisual communication technology and the disclosure of certain medical information by electronic transmission. I acknowledge that I have been given the opportunity to request an in-person assessment or other available alternative prior to the telemedicine visit and am voluntarily participating in the telemedicine visit.  I understand that I have the right to withhold or withdraw my consent to the use of telemedicine in the course of my care at any time, without affecting my right to future care or treatment, and that the Practitioner or I may terminate the telemedicine visit at any time. I understand that I have the right to inspect all information obtained and/or recorded in the course of the telemedicine visit and may receive copies of available information for a reasonable fee.  I understand that some of the potential risks of receiving the Services via telemedicine include:  Delay or interruption in medical evaluation due to technological equipment failure or disruption; Information transmitted may not be sufficient (e.g. poor resolution of  images) to allow for appropriate medical decision making by the Practitioner; and/or  In rare instances, security protocols could fail, causing a breach of personal health information.  Furthermore, I acknowledge that it is my responsibility to provide information about my medical history, conditions and care that is complete and accurate to the best of my ability. I acknowledge that Practitioner's advice, recommendations, and/or decision may be based on factors not within their control, such as incomplete or inaccurate data provided by me or distortions of diagnostic images or specimens that may result from electronic transmissions. I understand that the practice of medicine is not an exact science and that Practitioner makes no warranties or guarantees regarding treatment outcomes. I acknowledge that a copy of this consent can be made available to me via my patient portal [facility] MyChart), or I can request a printed copy by calling the office of [facility].    I understand that my insurance will be billed for this visit.   I have read or had this consent read to me. I understand the contents of this consent, which adequately explains the benefits and risks of the Services being provided via telemedicine.  I have been provided ample opportunity to ask questions regarding this consent and the Services and have had my questions answered to my satisfaction. I give my informed consent for the services to be provided through the use of telemedicine in my medical care

## 2023-04-27 NOTE — Telephone Encounter (Signed)
Pt scheduled 08/08 at 320. Ok'd with DW. Med rec and consent done.

## 2023-04-29 ENCOUNTER — Ambulatory Visit: Payer: Medicare Other | Attending: Internal Medicine

## 2023-04-29 DIAGNOSIS — Z01810 Encounter for preprocedural cardiovascular examination: Secondary | ICD-10-CM | POA: Diagnosis not present

## 2023-04-29 DIAGNOSIS — Z01818 Encounter for other preprocedural examination: Secondary | ICD-10-CM

## 2023-04-29 NOTE — Progress Notes (Signed)
Virtual Visit via Telephone Note   Because of John Choi's co-morbid illnesses, he is at least at moderate risk for complications without adequate follow up.  This format is felt to be most appropriate for this patient at this time.  The patient did not have access to video technology/had technical difficulties with video requiring transitioning to audio format only (telephone).  All issues noted in this document were discussed and addressed.  No physical exam could be performed with this format.  Please refer to the patient's chart for his consent to telehealth for [facility].  Evaluation Performed:  Preoperative cardiovascular risk assessment _____________   Date:  04/29/2023   Patient ID:  John Choi, DOB 02-23-1951, MRN [id number] Patient Location:  Home Provider location:   Office  Primary Care Provider:  Assunta Found, MD Primary Cardiologist:  Tonny Bollman, MD  Chief Complaint / Patient Profile   72 y.o. y/o male with a h/o AAA, s/p EVAR 2015, HL, HTN, chronic left leg edema, stage 3 CKD, and tobacco abuse, who is pending Lap cholecystectomy by Dr. Derrell Lolling on date to be determined,  and presents today for telephonic preoperative cardiovascular risk assessment.  History of Present Illness    John Choi is a 72 y.o. male who presents via audio/video conferencing for a telehealth visit today.  Pt was last seen in cardiology clinic on 02/10/2019 for by [name], PA.  At that time HARLEM SAUBER was doing well with the exception of chronic lower extremity edema.  The patient is now pending procedure as outlined above. Since his last visit, he has done well. Able to complete all ADL's no chest pain, DOE.fatigue, dizziness, or near syncope. Lower extremity edema is improved with reduction of amlodipine dose to 5 mg daily. BP has been well controlled per patient report. He complains of nausea from his gallbladder. He is anxious to have the surgery.  Past  Medical History    Past Medical History:  Diagnosis Date   Asthma    Cancer (HCC)    skin   CHF (congestive heart failure) (HCC)    Head trauma    High cholesterol    History of kidney stones    Hypertension    Peripheral vascular disease (HCC)    Pneumonia    Pubic ramus fracture (HCC) 02/2000   left; thrown from tractor/notes 03/05/2017   Past Surgical History:  Procedure Laterality Date   ABDOMINAL AORTIC ENDOVASCULAR STENT GRAFT N/A 07/20/2014   Procedure: ABDOMINAL AORTIC ENDOVASCULAR STENT GRAFT;  Surgeon: Nada Libman, MD;  Location: [facility] OR;  Service: Vascular;  Laterality: N/A;   BACK SURGERY     BREAST SURGERY     CARDIAC CATHETERIZATION  05/2014   COLONOSCOPY W/ POLYPECTOMY  1997   CYSTOSCOPY W/ STONE MANIPULATION  1980's and 1990's X 4   HERNIA REPAIR     LAPAROSCOPIC ASSISTED VENTRAL HERNIA REPAIR     "right above my bellybutton"   LEFT HEART CATHETERIZATION WITH CORONARY ANGIOGRAM N/A 06/11/2014   Procedure: LEFT HEART CATHETERIZATION WITH CORONARY ANGIOGRAM;  Surgeon: Micheline Chapman, MD;  Location: [facility] CATH LAB;  Service: Cardiovascular;  Laterality: N/A;   LUMBAR DISC SURGERY  X 2   "# 1, #2"   LUMBAR SPINE HARDWARE REMOVAL     "#4"   POSTERIOR LUMBAR FUSION     "#3"   VENTRAL HERNIA REPAIR N/A 12/31/2020   Procedure: LAPAROSCOPIC VENTRAL HERNIA REPAIR WITH MESH;  Surgeon: Axel Filler,  MD;  Location: [facility] ORS;  Service: General;  Laterality: N/A;  90/RM4    Allergies  No Known Allergies  Home Medications    Prior to Admission medications   Medication Sig Start Date End Date Taking? Authorizing Provider  albuterol (PROVENTIL HFA;VENTOLIN HFA) 108 (90 BASE) MCG/ACT inhaler Inhale 2 puffs into the lungs every 6 (six) hours as needed for wheezing or shortness of breath.    [provider]  amLODipine (NORVASC) 5 MG tablet Take 1 tablet (5 mg total) by mouth daily. 02/10/23 05/11/23  [name] T, PA-C  aspirin EC 81 MG tablet Take 81 mg by  mouth at bedtime.     [provider]  azelastine (OPTIVAR) 0.05 % ophthalmic solution Place 1 drop into both eyes 2 (two) times daily as needed (burning eyes). 09/06/20   [provider]  clotrimazole-betamethasone (LOTRISONE) cream Apply 1 application  topically daily as needed (eczema). 11/02/18   [provider]  metoprolol succinate (TOPROL-XL) 25 MG 24 hr tablet TAKE 1 TABLET BY MOUTH EVERY DAY 02/10/22   Tonny Bollman, MD  ondansetron (ZOFRAN) 4 MG tablet Take 1 tablet (4 mg total) by mouth every 6 (six) hours. 04/06/23   Elayne Snare K, DO  rosuvastatin (CRESTOR) 20 MG tablet TAKE 1 TABLET BY MOUTH EVERY DAY 02/10/22   Tonny Bollman, MD    Physical Exam    Vital Signs:  ALDAIR MENNELLA does not have vital signs available for review today.  Given telephonic nature of communication, physical exam is limited. AAOx3. NAD. Normal affect.  Speech and respirations are unlabored.  Accessory Clinical Findings    None  Assessment & Plan    1.  Preoperative Cardiovascular Risk Assessment:  According to the Revised Cardiac Risk Index (RCRI), his Perioperative Risk of Major Cardiac Event is (%): 0.4  His Functional Capacity in METs is: 8.91 according to the [facility] Activity Status Index (DASI).   Per office protocol, if patient is without any new symptoms or concerns at the time of their virtual visit, he may hold ASA for 7 days prior to procedure. Please resume ASA as soon as possible postprocedure, at the discretion of the surgeon.    The patient was advised that if he develops new symptoms prior to surgery to contact our office to arrange for a follow-up visit, and he verbalized understanding. I have reviewed and verified all medications on his med list with the patient. He verifies that they are correct.   Therefore, based on ACC/AHA guidelines, patient would be at acceptable risk for the planned procedure without further cardiovascular testing. I will  route this recommendation to the requesting party via Epic fax function.     Time:   Today, I have spent 10 minutes with the patient with telehealth technology discussing medical history, symptoms, and management plan.     Joni Reining, NP  04/29/2023, 4:12 PM

## 2023-05-26 NOTE — Pre-Procedure Instructions (Signed)
Surgical Instructions   Your procedure is scheduled on Thursday, September 12th. Report to [facility] Main Entrance "A" at 10:00 A.M., then check in with the Admitting office. Any questions or running late day of surgery: call [phone]  Questions prior to your surgery date: call [phone], Monday-Friday, 8am-4pm. If you experience any cold or flu symptoms such as cough, fever, chills, shortness of breath, etc. between now and your scheduled surgery, please notify us at the above number.     Remember:  Do not eat after midnight the night before your surgery   You may drink clear liquids until 09:00 AM the morning of your surgery.   Clear liquids allowed are: Water, Non-Citrus Juices (without pulp), Carbonated Beverages, Clear Tea, Black Coffee Only (NO MILK, CREAM OR POWDERED CREAMER of any kind), and Gatorade.  Patient Instructions  The night before surgery:  No food after midnight. ONLY clear liquids after midnight  The day of surgery (if you do NOT have diabetes):  Drink ONE (1) Pre-Surgery Clear Ensure by 09:00 AM the morning of surgery. Drink in one sitting. Do not sip.  This drink was given to you during your hospital  pre-op appointment visit.  Nothing else to drink after completing the  Pre-Surgery Clear Ensure.         If you have questions, please contact your surgeon's office.    Take these medicines the morning of surgery with A SIP OF WATER  amLODipine (NORVASC)  metoprolol succinate (TOPROL-XL)  ondansetron (ZOFRAN)  rosuvastatin (CRESTOR)    May take these medicines IF NEEDED: albuterol (PROVENTIL HFA;VENTOLIN HFA)- bring inhaler with you on day of surgery azelastine (OPTIVAR) eye drops  EPINEPHrine (PRIMATENE MIST IN)    Follow your surgeon's instructions on when to stop Aspirin.  If no instructions were given by your surgeon then you will need to call the office to get those instructions.     One week prior to surgery, STOP taking any Aleve,  Naproxen, Ibuprofen, Motrin, Advil, Goody's, BC's, all herbal medications, fish oil, and non-prescription vitamins.                     Do NOT Smoke (Tobacco/Vaping) for 24 hours prior to your procedure.  If you use a CPAP at night, you may bring your mask/headgear for your overnight stay.   You will be asked to remove any contacts, glasses, piercing's, hearing aid's, dentures/partials prior to surgery. Please bring cases for these items if needed.    Patients discharged the day of surgery will not be allowed to drive home, and someone needs to stay with them for 24 hours.  SURGICAL WAITING ROOM VISITATION Patients may have no more than 2 support people in the waiting area - these visitors may rotate.   Pre-op nurse will coordinate an appropriate time for 1 ADULT support person, who may not rotate, to accompany patient in pre-op.  Children under the age of 79 must have an adult with them who is not the patient and must remain in the main waiting area with an adult.  If the patient needs to stay at the hospital during part of their recovery, the visitor guidelines for inpatient rooms apply.  Please refer to the [facility] website for the visitor guidelines for any additional information.   If you received a COVID test during your pre-op visit  it is requested that you wear a mask when out in public, stay away from anyone that may not be feeling well  and notify your surgeon if you develop symptoms. If you have been in contact with anyone that has tested positive in the last 10 days please notify you surgeon.      Pre-operative CHG Bathing Instructions   You can play a key role in reducing the risk of infection after surgery. Your skin needs to be as free of germs as possible. You can reduce the number of germs on your skin by washing with CHG (chlorhexidine gluconate) soap before surgery. CHG is an antiseptic soap that kills germs and continues to kill germs even after washing.   DO NOT  use if you have an allergy to chlorhexidine/CHG or antibacterial soaps. If your skin becomes reddened or irritated, stop using the CHG and notify one of our RNs at [phone].              TAKE A SHOWER THE NIGHT BEFORE SURGERY AND THE DAY OF SURGERY    Please keep in mind the following:  DO NOT shave, including legs and underarms, 48 hours prior to surgery.   You may shave your face before/day of surgery.  Place clean sheets on your bed the night before surgery Use a clean washcloth (not used since being washed) for each shower. DO NOT sleep with pet's night before surgery.  CHG Shower Instructions:  If you choose to wash your hair and private area, wash first with your normal shampoo/soap.  After you use shampoo/soap, rinse your hair and body thoroughly to remove shampoo/soap residue.  Turn the water OFF and apply half the bottle of CHG soap to a CLEAN washcloth.  Apply CHG soap ONLY FROM YOUR NECK DOWN TO YOUR TOES (washing for 3-5 minutes)  DO NOT use CHG soap on face, private areas, open wounds, or sores.  Pay special attention to the area where your surgery is being performed.  If you are having back surgery, having someone wash your back for you may be helpful. Wait 2 minutes after CHG soap is applied, then you may rinse off the CHG soap.  Pat dry with a clean towel  Put on clean pajamas    Additional instructions for the day of surgery: DO NOT APPLY any lotions, deodorants, cologne, or perfumes.   Do not wear jewelry or makeup Do not wear nail polish, gel polish, artificial nails, or any other type of covering on natural nails (fingers and toes) Do not bring valuables to the hospital. [facility] is not responsible for valuables/personal belongings. Put on clean/comfortable clothes.  Please brush your teeth.  Ask your nurse before applying any prescription medications to the skin.

## 2023-05-27 ENCOUNTER — Encounter (HOSPITAL_COMMUNITY)
Admission: RE | Admit: 2023-05-27 | Discharge: 2023-05-27 | Disposition: A | Discharge status: Home / Self Care | Payer: Medicare Other | Source: Ambulatory Visit | Attending: General Surgery | Admitting: General Surgery

## 2023-05-27 ENCOUNTER — Encounter (HOSPITAL_COMMUNITY): Payer: Self-pay

## 2023-05-27 VITALS — BP 147/85 | HR 96 | Temp 97.7°F | Resp 18 | Ht 72.0 in | Wt 196.7 lb

## 2023-05-27 DIAGNOSIS — Z8679 Personal history of other diseases of the circulatory system: Secondary | ICD-10-CM | POA: Insufficient documentation

## 2023-05-27 DIAGNOSIS — R600 Localized edema: Secondary | ICD-10-CM | POA: Diagnosis not present

## 2023-05-27 DIAGNOSIS — Z01812 Encounter for preprocedural laboratory examination: Secondary | ICD-10-CM | POA: Insufficient documentation

## 2023-05-27 DIAGNOSIS — N183 Chronic kidney disease, stage 3 unspecified: Secondary | ICD-10-CM | POA: Diagnosis not present

## 2023-05-27 DIAGNOSIS — F17200 Nicotine dependence, unspecified, uncomplicated: Secondary | ICD-10-CM | POA: Insufficient documentation

## 2023-05-27 DIAGNOSIS — I129 Hypertensive chronic kidney disease with stage 1 through stage 4 chronic kidney disease, or unspecified chronic kidney disease: Secondary | ICD-10-CM | POA: Diagnosis not present

## 2023-05-27 DIAGNOSIS — E785 Hyperlipidemia, unspecified: Secondary | ICD-10-CM | POA: Diagnosis not present

## 2023-05-27 DIAGNOSIS — Z01818 Encounter for other preprocedural examination: Secondary | ICD-10-CM

## 2023-05-27 DIAGNOSIS — I452 Bifascicular block: Secondary | ICD-10-CM | POA: Diagnosis not present

## 2023-05-27 DIAGNOSIS — I10 Essential (primary) hypertension: Secondary | ICD-10-CM

## 2023-05-27 LAB — BASIC METABOLIC PANEL
Anion gap: 8 (ref 5–15)
BUN: 14 mg/dL (ref 8–23)
CO2: 26 mmol/L (ref 22–32)
Calcium: 9 mg/dL (ref 8.9–10.3)
Chloride: 106 mmol/L (ref 98–111)
Creatinine, Ser: 1.51 mg/dL — ABNORMAL HIGH (ref 0.61–1.24)
GFR, Estimated: 49 mL/min — ABNORMAL LOW (ref >60)
Glucose, Bld: 100 mg/dL — ABNORMAL HIGH (ref 70–99)
Potassium: 4.6 mmol/L (ref 3.5–5.1)
Sodium: 140 mmol/L (ref 135–145)

## 2023-05-27 LAB — CBC
HCT: 51.2 % (ref 39.0–52.0)
Hemoglobin: 17.1 g/dL — ABNORMAL HIGH (ref 13.0–17.0)
MCH: 32.1 pg (ref 26.0–34.0)
MCHC: 33.4 g/dL (ref 30.0–36.0)
MCV: 96.2 fL (ref 80.0–100.0)
Platelets: 237 10*3/uL (ref 150–400)
RBC: 5.32 MIL/uL (ref 4.22–5.81)
RDW: 13.9 % (ref 11.5–15.5)
WBC: 5.9 10*3/uL (ref 4.0–10.5)
nRBC: 0 % (ref 0.0–0.2)

## 2023-05-27 NOTE — Progress Notes (Addendum)
PCP - Dr. Assunta Found Cardiologist -  Dr. Excell Seltzer  Chest x-ray - 02/22/17 EKG - 02/10/2023 Stress Test - 05/30/2014 ECHO - 10/19/2011 Cardiac Cath - 06/11/2014   Aspirin Instructions: FOLLOW UP WITH SURGEON  ERAS Protcol - YES PRE-SURGERY Ensure GIVEN AT PAT  COVID TEST-  N/A   Anesthesia review: YES, CARDIAC HX  Patient denies shortness of breath, fever, cough and chest pain at PAT appointment. Patient denies any respiratory illness / infection in the last 2 months.    All instructions explained to the patient, with a verbal understanding of the material. Patient agrees to go over the instructions while at home for a better understanding. Patient also instructed to self quarantine after being tested for COVID-19. The opportunity to ask questions was provided.

## 2023-05-28 NOTE — Anesthesia Preprocedure Evaluation (Signed)
Anesthesia Evaluation  Patient identified by MRN, date of birth, ID band Patient awake    Reviewed: Allergy & Precautions, NPO status , Patient's Chart, lab work & pertinent test results  Airway Mallampati: II  TM Distance: >3 FB Neck ROM: Full    Dental  (+) Dental Advisory Given   Pulmonary asthma , Current Smoker and Patient abstained from smoking.   breath sounds clear to auscultation       Cardiovascular hypertension, Pt. on medications and Pt. on home beta blockers + Peripheral Vascular Disease and +CHF   Rhythm:Regular Rate:Normal     Neuro/Psych negative neurological ROS     GI/Hepatic negative GI ROS, Neg liver ROS,,,  Endo/Other  negative endocrine ROS    Renal/GU Renal InsufficiencyRenal disease     Musculoskeletal  (+) Arthritis ,    Abdominal   Peds  Hematology negative hematology ROS (+)   Anesthesia Other Findings   Reproductive/Obstetrics                             Anesthesia Physical Anesthesia Plan  ASA: 2  Anesthesia Plan: General   Post-op Pain Management: Tylenol PO (pre-op)* and Toradol IV (intra-op)*   Induction: Intravenous  PONV Risk Score and Plan: 1 and Dexamethasone and Ondansetron  Airway Management Planned: Oral ETT  Additional Equipment: None  Intra-op Plan:   Post-operative Plan: Extubation in OR  Informed Consent: I have reviewed the patients History and Physical, chart, labs and discussed the procedure including the risks, benefits and alternatives for the proposed anesthesia with the patient or authorized representative who has indicated his/her understanding and acceptance.     Dental advisory given  Plan Discussed with:   Anesthesia Plan Comments: (PAT note by Antionette Poles, PA-C: Follows with cardiology for history of chronic lower extremity edema, hypertension hyperlipidemia, bifascicular block.  Cath 05/2014 showed normal  coronaries..  Seen by Joni Reining, NP on 04/29/2023 for preop evaluation.  Per note, "According to the Revised Cardiac Risk Index (RCRI), his Perioperative Risk of Major Cardiac Event is (%): 0.4. His Functional Capacity in METs is: 8.91 according to the Duke Activity Status Index (DASI). Per office protocol, if patient is without any new symptoms or concerns at the time of their virtual visit, he may hold ASA for 7 days prior to procedure. Please resume ASA as soon as possible postprocedure, at the discretion of the surgeon."  Follows with vascular surgery for history of AAA s/p endovascular repair 2015.  EVAR duplex 01/18/2023 showed a 3.3 cm AAA sac without any evidence of endoleak.  This was stable compared to the prior study.  Recommended repeat EVAR duplex in 1 year.  Current everyday smoker.  Preop labs reviewed, creatinine elevated at 1.51 (consistent with history of CKD 3), otherwise unremarkable.  EKG 02/10/2023: NSR.  Rate 80.  Right bundle branch block.  Left anterior fascicular block.  No significant change.  )        Anesthesia Quick Evaluation

## 2023-05-28 NOTE — Progress Notes (Signed)
Anesthesia Chart Review:  Follows with cardiology for history of chronic lower extremity edema, hypertension hyperlipidemia, bifascicular block.  Cath 05/2014 showed normal coronaries..  Seen by Joni Reining, NP on 04/29/2023 for preop evaluation.  Per note, "According to the Revised Cardiac Risk Index (RCRI), his Perioperative Risk of Major Cardiac Event is (%): 0.4. His Functional Capacity in METs is: 8.91 according to the Duke Activity Status Index (DASI). Per office protocol, if patient is without any new symptoms or concerns at the time of their virtual visit, he may hold ASA for 7 days prior to procedure. Please resume ASA as soon as possible postprocedure, at the discretion of the surgeon."  Follows with vascular surgery for history of AAA s/p endovascular repair 2015.  EVAR duplex 01/18/2023 showed a 3.3 cm AAA sac without any evidence of endoleak.  This was stable compared to the prior study.  Recommended repeat EVAR duplex in 1 year.  Current everyday smoker.  Preop labs reviewed, creatinine elevated at 1.51 (consistent with history of CKD 3), otherwise unremarkable.  EKG 02/10/2023: NSR.  Rate 80.  Right bundle branch block.  Left anterior fascicular block.  No significant change.  Zannie Cove [facility] Short Stay Center/Anesthesiology Phone [phone] 05/28/2023 2:08 PM

## 2023-06-03 ENCOUNTER — Ambulatory Visit (HOSPITAL_COMMUNITY)
Admission: RE | Admit: 2023-06-03 | Discharge: 2023-06-03 | Disposition: A | Discharge status: Home / Self Care | Payer: Medicare Other | Attending: General Surgery | Admitting: General Surgery

## 2023-06-03 ENCOUNTER — Other Ambulatory Visit: Payer: Self-pay

## 2023-06-03 ENCOUNTER — Encounter (HOSPITAL_COMMUNITY): Payer: Self-pay | Admitting: General Surgery

## 2023-06-03 ENCOUNTER — Ambulatory Visit (HOSPITAL_COMMUNITY): Payer: Medicare Other | Admitting: Physician Assistant

## 2023-06-03 ENCOUNTER — Encounter (HOSPITAL_COMMUNITY)
Admission: RE | Disposition: A | Discharge status: Home / Self Care | Payer: Self-pay | Source: Home / Self Care | Attending: General Surgery

## 2023-06-03 ENCOUNTER — Ambulatory Visit (HOSPITAL_BASED_OUTPATIENT_CLINIC_OR_DEPARTMENT_OTHER): Payer: Medicare Other | Admitting: Anesthesiology

## 2023-06-03 DIAGNOSIS — I110 Hypertensive heart disease with heart failure: Secondary | ICD-10-CM | POA: Diagnosis not present

## 2023-06-03 DIAGNOSIS — F17210 Nicotine dependence, cigarettes, uncomplicated: Secondary | ICD-10-CM | POA: Diagnosis not present

## 2023-06-03 DIAGNOSIS — K8010 Calculus of gallbladder with chronic cholecystitis without obstruction: Secondary | ICD-10-CM | POA: Diagnosis not present

## 2023-06-03 DIAGNOSIS — I509 Heart failure, unspecified: Secondary | ICD-10-CM | POA: Diagnosis not present

## 2023-06-03 DIAGNOSIS — J4489 Other specified chronic obstructive pulmonary disease: Secondary | ICD-10-CM | POA: Insufficient documentation

## 2023-06-03 DIAGNOSIS — K8020 Calculus of gallbladder without cholecystitis without obstruction: Secondary | ICD-10-CM | POA: Diagnosis not present

## 2023-06-03 HISTORY — PX: CHOLECYSTECTOMY: SHX55

## 2023-06-03 SURGERY — LAPAROSCOPIC CHOLECYSTECTOMY
Anesthesia: General | Site: Abdomen

## 2023-06-03 MED ORDER — CHLORHEXIDINE GLUCONATE CLOTH 2 % EX PADS: 6.0000 | MEDICATED_PAD | Freq: Once | CUTANEOUS | Status: DC

## 2023-06-03 MED ORDER — ACETAMINOPHEN 500 MG PO TABS
ORAL_TABLET | ORAL | Status: AC
  Administered 2023-06-03: 1000 mg via ORAL
  Filled 2023-06-03: qty 2

## 2023-06-03 MED ORDER — ROCURONIUM BROMIDE 10 MG/ML (PF) SYRINGE
PREFILLED_SYRINGE | INTRAVENOUS | Status: AC
  Filled 2023-06-03: qty 10

## 2023-06-03 MED ORDER — AMISULPRIDE (ANTIEMETIC) 5 MG/2ML IV SOLN: 10.0000 mg | Freq: Once | INTRAVENOUS | Status: DC | PRN

## 2023-06-03 MED ORDER — ROCURONIUM BROMIDE 10 MG/ML (PF) SYRINGE
PREFILLED_SYRINGE | INTRAVENOUS | Status: DC | PRN
  Administered 2023-06-03: 80 mg via INTRAVENOUS

## 2023-06-03 MED ORDER — FENTANYL CITRATE (PF) 100 MCG/2ML IJ SOLN: 25.0000 ug | INTRAMUSCULAR | Status: DC | PRN

## 2023-06-03 MED ORDER — SODIUM CHLORIDE 0.9 % IR SOLN
Status: DC | PRN
  Administered 2023-06-03: 1000 mL

## 2023-06-03 MED ORDER — SUGAMMADEX SODIUM 200 MG/2ML IV SOLN
INTRAVENOUS | Status: DC | PRN
  Administered 2023-06-03: 200 mg via INTRAVENOUS

## 2023-06-03 MED ORDER — CHLORHEXIDINE GLUCONATE 0.12 % MT SOLN
15.0000 mL | Freq: Once | OROMUCOSAL | Status: AC
  Administered 2023-06-03: 15 mL via OROMUCOSAL

## 2023-06-03 MED ORDER — EPHEDRINE 5 MG/ML INJ
INTRAVENOUS | Status: AC
  Filled 2023-06-03: qty 5

## 2023-06-03 MED ORDER — CEFAZOLIN SODIUM-DEXTROSE 2-4 GM/100ML-% IV SOLN
2.0000 g | INTRAVENOUS | Status: AC
  Administered 2023-06-03: 2 g via INTRAVENOUS

## 2023-06-03 MED ORDER — LACTATED RINGERS IV SOLN: INTRAVENOUS | Status: DC

## 2023-06-03 MED ORDER — CEFAZOLIN SODIUM-DEXTROSE 2-4 GM/100ML-% IV SOLN
INTRAVENOUS | Status: AC
  Filled 2023-06-03: qty 100

## 2023-06-03 MED ORDER — BUPIVACAINE HCL 0.25 % IJ SOLN
INTRAMUSCULAR | Status: DC | PRN
  Administered 2023-06-03: 10 mL

## 2023-06-03 MED ORDER — TRAMADOL HCL 50 MG PO TABS: 50.0000 mg | ORAL_TABLET | Freq: Four times a day (QID) | ORAL | 0 refills | Status: AC | PRN

## 2023-06-03 MED ORDER — DEXAMETHASONE SODIUM PHOSPHATE 10 MG/ML IJ SOLN
INTRAMUSCULAR | Status: DC | PRN
  Administered 2023-06-03: 5 mg via INTRAVENOUS

## 2023-06-03 MED ORDER — PHENYLEPHRINE 80 MCG/ML (10ML) SYRINGE FOR IV PUSH (FOR BLOOD PRESSURE SUPPORT)
PREFILLED_SYRINGE | INTRAVENOUS | Status: AC
  Filled 2023-06-03: qty 10

## 2023-06-03 MED ORDER — LIDOCAINE 2% (20 MG/ML) 5 ML SYRINGE
INTRAMUSCULAR | Status: AC
  Filled 2023-06-03: qty 5

## 2023-06-03 MED ORDER — PROPOFOL 10 MG/ML IV BOLUS
INTRAVENOUS | Status: DC | PRN
  Administered 2023-06-03: 150 mg via INTRAVENOUS

## 2023-06-03 MED ORDER — CHLORHEXIDINE GLUCONATE 0.12 % MT SOLN
OROMUCOSAL | Status: AC
  Filled 2023-06-03: qty 15

## 2023-06-03 MED ORDER — DEXAMETHASONE SODIUM PHOSPHATE 10 MG/ML IJ SOLN
INTRAMUSCULAR | Status: AC
  Filled 2023-06-03: qty 1

## 2023-06-03 MED ORDER — PHENYLEPHRINE 80 MCG/ML (10ML) SYRINGE FOR IV PUSH (FOR BLOOD PRESSURE SUPPORT)
PREFILLED_SYRINGE | INTRAVENOUS | Status: DC | PRN
  Administered 2023-06-03 (×3): 160 ug via INTRAVENOUS

## 2023-06-03 MED ORDER — ONDANSETRON HCL 4 MG/2ML IJ SOLN
INTRAMUSCULAR | Status: DC | PRN
  Administered 2023-06-03: 4 mg via INTRAVENOUS

## 2023-06-03 MED ORDER — ACETAMINOPHEN 500 MG PO TABS: 1000.0000 mg | ORAL_TABLET | ORAL | Status: AC

## 2023-06-03 MED ORDER — EPHEDRINE SULFATE-NACL 50-0.9 MG/10ML-% IV SOSY
PREFILLED_SYRINGE | INTRAVENOUS | Status: DC | PRN
  Administered 2023-06-03: 10 mg via INTRAVENOUS

## 2023-06-03 MED ORDER — ORAL CARE MOUTH RINSE: 15.0000 mL | Freq: Once | OROMUCOSAL | Status: AC

## 2023-06-03 MED ORDER — 0.9 % SODIUM CHLORIDE (POUR BTL) OPTIME
TOPICAL | Status: DC | PRN
  Administered 2023-06-03: 1000 mL

## 2023-06-03 MED ORDER — LIDOCAINE 2% (20 MG/ML) 5 ML SYRINGE
INTRAMUSCULAR | Status: DC | PRN
  Administered 2023-06-03: 60 mg via INTRAVENOUS

## 2023-06-03 MED ORDER — PROPOFOL 10 MG/ML IV BOLUS
INTRAVENOUS | Status: AC
  Filled 2023-06-03: qty 20

## 2023-06-03 MED ORDER — ONDANSETRON HCL 4 MG/2ML IJ SOLN
INTRAMUSCULAR | Status: AC
  Filled 2023-06-03: qty 2

## 2023-06-03 MED ORDER — KETOROLAC TROMETHAMINE 30 MG/ML IJ SOLN
INTRAMUSCULAR | Status: DC | PRN
  Administered 2023-06-03: 15 mg via INTRAVENOUS

## 2023-06-03 MED ORDER — FENTANYL CITRATE (PF) 250 MCG/5ML IJ SOLN
INTRAMUSCULAR | Status: AC
  Filled 2023-06-03: qty 5

## 2023-06-03 MED ORDER — ENSURE PRE-SURGERY PO LIQD: 296.0000 mL | Freq: Once | ORAL | Status: DC

## 2023-06-03 MED ORDER — FENTANYL CITRATE (PF) 250 MCG/5ML IJ SOLN
INTRAMUSCULAR | Status: DC | PRN
  Administered 2023-06-03: 50 ug via INTRAVENOUS
  Administered 2023-06-03: 100 ug via INTRAVENOUS

## 2023-06-03 MED ORDER — KETOROLAC TROMETHAMINE 30 MG/ML IJ SOLN
INTRAMUSCULAR | Status: AC
  Filled 2023-06-03: qty 1

## 2023-06-03 SURGICAL SUPPLY — 43 items
ADH SKN CLS APL DERMABOND .7 (GAUZE/BANDAGES/DRESSINGS) ×1
APL PRP STRL LF DISP 70% ISPRP (MISCELLANEOUS) ×1
BAG COUNTER SPONGE SURGICOUNT (BAG) ×1 IMPLANT
BAG SPEC RTRVL 10 TROC 200 (ENDOMECHANICALS)
BAG SPNG CNTER NS LX DISP (BAG) ×1
CANISTER SUCT 3000ML PPV (MISCELLANEOUS) ×1 IMPLANT
CHLORAPREP W/TINT 26 (MISCELLANEOUS) ×1 IMPLANT
CLIP LIGATING HEMO O LOK GREEN (MISCELLANEOUS) ×1 IMPLANT
COVER SURGICAL LIGHT HANDLE (MISCELLANEOUS) ×1 IMPLANT
COVER TRANSDUCER ULTRASND (DRAPES) ×1 IMPLANT
DERMABOND ADVANCED .7 DNX12 (GAUZE/BANDAGES/DRESSINGS) ×1 IMPLANT
ELECT REM PT RETURN 9FT ADLT (ELECTROSURGICAL) ×1
ELECTRODE REM PT RTRN 9FT ADLT (ELECTROSURGICAL) ×1 IMPLANT
GLOVE BIO SURGEON STRL SZ7.5 (GLOVE) ×2 IMPLANT
GOWN STRL REUS W/ TWL LRG LVL3 (GOWN DISPOSABLE) ×2 IMPLANT
GOWN STRL REUS W/ TWL XL LVL3 (GOWN DISPOSABLE) ×1 IMPLANT
GOWN STRL REUS W/TWL LRG LVL3 (GOWN DISPOSABLE) ×2
GOWN STRL REUS W/TWL XL LVL3 (GOWN DISPOSABLE) ×1
GRASPER SUT TROCAR 14GX15 (MISCELLANEOUS) ×1 IMPLANT
IRRIG SUCT STRYKERFLOW 2 WTIP (MISCELLANEOUS) ×1
IRRIGATION SUCT STRKRFLW 2 WTP (MISCELLANEOUS) ×1 IMPLANT
KIT BASIN OR (CUSTOM PROCEDURE TRAY) ×1 IMPLANT
KIT IMAGING PINPOINTPAQ (MISCELLANEOUS) IMPLANT
KIT TURNOVER KIT B (KITS) ×1 IMPLANT
NDL INSUFFLATION 14GA 120MM (NEEDLE) ×1 IMPLANT
NEEDLE INSUFFLATION 14GA 120MM (NEEDLE) ×1
NS IRRIG 1000ML POUR BTL (IV SOLUTION) ×1 IMPLANT
PAD ARMBOARD 7.5X6 YLW CONV (MISCELLANEOUS) ×1 IMPLANT
POUCH LAPAROSCOPIC INSTRUMENT (MISCELLANEOUS) ×1 IMPLANT
POUCH RETRIEVAL ECOSAC 10 (ENDOMECHANICALS) IMPLANT
SCISSORS LAP 5X35 DISP (ENDOMECHANICALS) ×1 IMPLANT
SET TUBE SMOKE EVAC HIGH FLOW (TUBING) ×1 IMPLANT
SLEEVE Z-THREAD 5X100MM (TROCAR) ×1 IMPLANT
SPECIMEN JAR SMALL (MISCELLANEOUS) ×1 IMPLANT
SUT MNCRL AB 4-0 PS2 18 (SUTURE) ×1 IMPLANT
SUT NOVA NAB DX-16 0-1 5-0 T12 (SUTURE) IMPLANT
TOWEL GREEN STERILE (TOWEL DISPOSABLE) ×1 IMPLANT
TOWEL GREEN STERILE FF (TOWEL DISPOSABLE) ×1 IMPLANT
TRAY LAPAROSCOPIC MC (CUSTOM PROCEDURE TRAY) ×1 IMPLANT
TROCAR 11X100 Z THREAD (TROCAR) ×1 IMPLANT
TROCAR Z-THREAD OPTICAL 5X100M (TROCAR) ×1 IMPLANT
WARMER LAPAROSCOPE (MISCELLANEOUS) ×1 IMPLANT
WATER STERILE IRR 1000ML POUR (IV SOLUTION) ×1 IMPLANT

## 2023-06-03 NOTE — Op Note (Signed)
06/03/2023  11:27 AM  PATIENT:  John Choi  72 y.o. male  PRE-OPERATIVE DIAGNOSIS:  GALLSTONES  POST-OPERATIVE DIAGNOSIS:  GALLSTONES, CHRONIC CHOLECYSTITIS  PROCEDURE:  Procedure(s): LAPAROSCOPIC CHOLECYSTECTOMY (N/A)  SURGEON:  Surgeons and Role:    Axel Filler, MD - Primary  ASSISTANTS: Patrici Ranks, RNFA   ANESTHESIA:   local and general  EBL:  none   BLOOD ADMINISTERED:none  DRAINS: none   LOCAL MEDICATIONS USED:  BUPIVICAINE   SPECIMEN:  Source of Specimen:  gallbladder  DISPOSITION OF SPECIMEN:  PATHOLOGY  COUNTS:  YES  TOURNIQUET:  * No tourniquets in log *  DICTATION: .Dragon Dictation  The patient was taken to the operating and placed in the supine position with bilateral SCDs in place.  The patient was prepped and draped in the usual sterile fashion. A time out was called and all facts were verified. A pneumoperitoneum was obtained via A Veress needle technique to a pressure of 14mm of mercury.  A 5mm trochar was then placed in the right upper quadrant under visualization, and there were no injuries to any abdominal organs. A 11 mm port was then placed in the umbilical region after infiltrating with local anesthesia under direct visualization. A second and third epigastric port and right lower quadrant port placement under direct visualization, respectively.    The gallbladder was identified and retracted, the peritoneum was then sharply dissected from the gallbladder and this dissection was carried down to Calot's triangle. The cystic duct was identified and stripped away circumferentially and seen going into the gallbladder 360, the critical angle was obtained.  2 clips were placed proximally one distally and the cystic duct transected. The cystic artery was identified and 2 clips placed proximally and one distally and transected.  We then proceeded to remove the gallbladder off the hepatic fossa with Bovie cautery.  There was some spillage of the  bile.  This was suctioned up.  A retrieval bag was then placed in the abdomen and gallbladder placed in the bag. The hepatic fossa was then reexamined and hemostasis was achieved with Bovie cautery and was excellent at the end of the case.   The subhepatic fossa and perihepatic fossa was then irrigated until the effluent was clear.  The gallbladder and bag were removed from the abdominal cavity. The 11 mm trocar fascia was reapproximated with the Endo Close #1 Novafil x1.  The pneumoperitoneum was evacuated and all trochars removed under direct visulalization.  The skin was then closed with 4-0 Monocryl and the skin dressed with Dermabond.    The patient was awaken from general anesthesia and taken to the recovery room in stable condition.    PLAN OF CARE: Discharge to home after PACU  PATIENT DISPOSITION:  PACU - hemodynamically stable.   Delay start of Pharmacological VTE agent (>24hrs) due to surgical blood loss or risk of bleeding: not applicable

## 2023-06-03 NOTE — Transfer of Care (Signed)
Immediate Anesthesia Transfer of Care Note  Patient: John Choi  Procedure(s) Performed: LAPAROSCOPIC CHOLECYSTECTOMY (Abdomen)  Patient Location: PACU  Anesthesia Type:General  Level of Consciousness: awake, sedated, and drowsy  Airway & Oxygen Therapy: Patient Spontanous Breathing and Patient connected to nasal cannula oxygen  Post-op Assessment: Report given to RN and Post -op Vital signs reviewed and stable  Post vital signs: Reviewed and stable  Last Vitals:  Vitals Value Taken Time  BP 155/90 06/03/23 1145  Temp 97.4   Pulse 95 06/03/23 1146  Resp 20 06/03/23 1146  SpO2 91 % 06/03/23 1146  Vitals shown include unfiled device data.  Last Pain:  Vitals:   06/03/23 0903  PainSc: 0-No pain         Complications: No notable events documented.

## 2023-06-03 NOTE — H&P (Signed)
Chief Complaint: New Consultation (Calculus of gallbladder without cholecystitis without obstruction)       History of Present Illness: John Choi is a 72 y.o. male who is seen today as an office consultation at the request of Dr. Room for evaluation of New Consultation (Calculus of gallbladder without cholecystitis without obstruction) .   Patient is a 71 year old male who comes in secondary to gallstones. Patient previously had a lap ventral hernia repair in 2022.   Patient states he was recently ER secondary to abdominal pain.  He states was right upper quadrant.  He states this was after eating a large amount of tomatoes.  He states that he has some nausea vomiting with it.  He has had some episodes thereafter.   Upon evaluation the ER he underwent ultrasound.  This was relevant for gallstones.  Patient with normal LFTs at that time.           Review of Systems: A complete review of systems was obtained from the patient.  I have reviewed this information and discussed as appropriate with the patient.  See HPI as well for other ROS.   Review of Systems  Constitutional:  Negative for fever.  HENT:  Negative for congestion.   Eyes:  Negative for blurred vision.  Respiratory:  Negative for cough, shortness of breath and wheezing.   Cardiovascular:  Negative for chest pain and palpitations.  Gastrointestinal:  Positive for abdominal pain, nausea and vomiting. Negative for heartburn.  Genitourinary:  Negative for dysuria.  Musculoskeletal:  Negative for myalgias.  Skin:  Negative for rash.  Neurological:  Negative for dizziness and headaches.  Psychiatric/Behavioral:  Negative for depression and suicidal ideas.   All other systems reviewed and are negative.       Medical History: Past Medical History         Past Medical History:  Diagnosis Date   Asthma, unspecified asthma severity, unspecified whether complicated, unspecified whether persistent (HHS-HCC)     COPD  (chronic obstructive pulmonary disease) (CMS/HHS-HCC)            Problem List  There is no problem list on file for this patient.      Past Surgical History           Past Surgical History:  Procedure Laterality Date   back surgery        x4   HERNIA REPAIR            Allergies  No Known Allergies      Medications Ordered Prior to Encounter             Current Outpatient Medications on File Prior to Visit  Medication Sig Dispense Refill   albuterol MDI, PROVENTIL, VENTOLIN, PROAIR, HFA 90 mcg/actuation inhaler Inhale 2 Inhalations into the lungs every 6 (six) hours as needed       amLODIPine (NORVASC) 5 MG tablet Take 5 mg by mouth once daily       aspirin 81 MG EC tablet Take 81 mg by mouth at bedtime       metoprolol succinate (TOPROL-XL) 25 MG XL tablet Take 12.5 mg by mouth once daily       rosuvastatin (CRESTOR) 20 MG tablet Take 20 mg by mouth once daily        No current facility-administered medications on file prior to visit.        Family History           Family History  Problem Relation  Age of Onset   High blood pressure (Hypertension) Mother     Hyperlipidemia (Elevated cholesterol) Mother     Coronary Artery Disease (Blocked arteries around heart) Mother     Skin cancer Father     Hyperlipidemia (Elevated cholesterol) Father     High blood pressure (Hypertension) Father     Coronary Artery Disease (Blocked arteries around heart) Father     Skin cancer Sister     High blood pressure (Hypertension) Sister     Hyperlipidemia (Elevated cholesterol) Sister     Coronary Artery Disease (Blocked arteries around heart) Sister     Hyperlipidemia (Elevated cholesterol) Brother     High blood pressure (Hypertension) Brother     Coronary Artery Disease (Blocked arteries around heart) Brother          Tobacco Use History  Social History           Tobacco Use  Smoking Status Every Day   Types: Cigarettes  Smokeless Tobacco Never        Social  History  Social History             Socioeconomic History   Marital status: Divorced  Tobacco Use   Smoking status: Every Day      Types: Cigarettes   Smokeless tobacco: Never  Vaping Use   Vaping status: Never Used  Substance and Sexual Activity   Alcohol use: Yes   Drug use: Never        Objective:   BP (!) 156/87   Pulse 77   Temp 97.8 F (36.6 C)   Resp 18   Ht 6' (1.829 m)   Wt 88.9 kg   SpO2 94%   BMI 26.58 kg/m   Body mass index is 26.72 kg/m.   Physical Exam Constitutional:      General: He is not in acute distress.    Appearance: Normal appearance.  HENT:     Head: Normocephalic.     Nose: No rhinorrhea.     Mouth/Throat:     Mouth: Mucous membranes are moist.     Pharynx: Oropharynx is clear.  Eyes:     General: No scleral icterus.    Pupils: Pupils are equal, round, and reactive to light.  Cardiovascular:     Rate and Rhythm: Normal rate.     Pulses: Normal pulses.  Pulmonary:     Effort: Pulmonary effort is normal. No respiratory distress.     Breath sounds: No stridor. No wheezing.  Abdominal:     General: Abdomen is flat. There is no distension.     Tenderness: There is no abdominal tenderness. There is no guarding or rebound.  Musculoskeletal:        General: Normal range of motion.     Cervical back: Normal range of motion and neck supple.  Skin:    General: Skin is warm and dry.     Capillary Refill: Capillary refill takes less than 2 seconds.     Coloration: Skin is not jaundiced.  Neurological:     General: No focal deficit present.     Mental Status: He is alert and oriented to person, place, and time. Mental status is at baseline.  Psychiatric:        Mood and Affect: Mood normal.        Thought Content: Thought content normal.        Judgment: Judgment normal.        Assessment and Plan:  Diagnoses and all  orders for this visit:   Symptomatic cholelithiasis     John Choi is a 72 y.o. male     We will proceed  to the OR for a lap cholecystectomy. All risks and benefits were discussed with the patient to generally include: infection, bleeding, possible need for post op ERCP, damage to the bile ducts, and bile leak. Alternatives were offered and described.  All questions were answered and the patient voiced understanding of the procedure and wishes to proceed at this point with a laparoscopic cholecystectomy           No follow-ups on file.   Axel Filler, MD, [facility], [facility]

## 2023-06-03 NOTE — Discharge Instructions (Signed)

## 2023-06-03 NOTE — Anesthesia Postprocedure Evaluation (Signed)
Anesthesia Post Note  Patient: John Choi  Procedure(s) Performed: LAPAROSCOPIC CHOLECYSTECTOMY (Abdomen)     Patient location during evaluation: PACU Anesthesia Type: General Level of consciousness: awake and alert Pain management: pain level controlled Vital Signs Assessment: post-procedure vital signs reviewed and stable Respiratory status: spontaneous breathing, nonlabored ventilation, respiratory function stable and patient connected to nasal cannula oxygen Cardiovascular status: blood pressure returned to baseline and stable Postop Assessment: no apparent nausea or vomiting Anesthetic complications: no  No notable events documented.  Last Vitals:  Vitals:   06/03/23 1245 06/03/23 1300  BP: (!) 152/84 (!) 150/92  Pulse: 68 79  Resp: 13 19  Temp:  (!) 36.2 C  SpO2: 95% 95%    Last Pain:  Vitals:   06/03/23 1145  PainSc: 0-No pain                 Kennieth Rad

## 2023-06-03 NOTE — Anesthesia Procedure Notes (Signed)
Procedure Name: Intubation Date/Time: 06/03/2023 10:47 AM  Performed by: April Holding, CRNAPre-anesthesia Checklist: Patient identified, Emergency Drugs available, Suction available and Patient being monitored Patient Re-evaluated:Patient Re-evaluated prior to induction Oxygen Delivery Method: Circle system utilized Preoxygenation: Pre-oxygenation with 100% oxygen Induction Type: IV induction Ventilation: Mask ventilation without difficulty Laryngoscope Size: Miller and 2 Grade View: Grade I Tube type: Oral Number of attempts: 1 Airway Equipment and Method: Stylet and Oral airway Placement Confirmation: ETT inserted through vocal cords under direct vision, positive ETCO2 and breath sounds checked- equal and bilateral Secured at: 23 cm Tube secured with: Tape Dental Injury: Teeth and Oropharynx as per pre-operative assessment

## 2023-06-04 ENCOUNTER — Encounter (HOSPITAL_COMMUNITY): Payer: Self-pay | Admitting: General Surgery

## 2023-06-04 LAB — SURGICAL PATHOLOGY

## 2023-07-02 DIAGNOSIS — Z23 Encounter for immunization: Secondary | ICD-10-CM | POA: Diagnosis not present

## 2023-08-09 DIAGNOSIS — Z23 Encounter for immunization: Secondary | ICD-10-CM | POA: Diagnosis not present

## 2023-10-04 ENCOUNTER — Telehealth: Payer: Self-pay | Admitting: Physician Assistant

## 2023-10-04 NOTE — Telephone Encounter (Signed)
   Pt c/o of Chest Pain: STAT if active CP, including tightness, pressure, jaw pain, radiating pain to shoulder/upper arm/back, CP unrelieved by Nitro. Symptoms reported of SOB, nausea, vomiting, sweating.  1. Are you having CP right now? Not at this time  and  his neck does not feel right   2. Are you experiencing any other symptoms (ex. SOB, nausea, vomiting, sweating)? no   3. Is your CP continuous or coming and going? Comes and goes   4. Have you taken Nitroglycerin ? never had any   5. How long have you been experiencing CP? for about  2 weeks   6. If NO CP at time of call then end call with telling Pt to call back or call 911 if Chest pain returns prior to return call from triage team.

## 2023-10-08 ENCOUNTER — Ambulatory Visit: Payer: Medicare Other | Attending: Physician Assistant | Admitting: Physician Assistant

## 2023-10-08 ENCOUNTER — Encounter: Payer: Self-pay | Admitting: Physician Assistant

## 2023-10-08 VITALS — BP 148/86 | HR 68 | Ht 72.0 in | Wt 184.0 lb

## 2023-10-08 DIAGNOSIS — I10 Essential (primary) hypertension: Secondary | ICD-10-CM | POA: Diagnosis not present

## 2023-10-08 DIAGNOSIS — R072 Precordial pain: Secondary | ICD-10-CM

## 2023-10-08 DIAGNOSIS — Z79899 Other long term (current) drug therapy: Secondary | ICD-10-CM | POA: Diagnosis not present

## 2023-10-08 DIAGNOSIS — Z720 Tobacco use: Secondary | ICD-10-CM

## 2023-10-08 DIAGNOSIS — R0609 Other forms of dyspnea: Secondary | ICD-10-CM | POA: Diagnosis not present

## 2023-10-08 MED ORDER — AMLODIPINE BESYLATE 5 MG PO TABS
5.0000 mg | ORAL_TABLET | Freq: Every day | ORAL | 3 refills | Status: AC
Start: 2023-10-08 — End: 2024-01-06

## 2023-10-08 MED ORDER — METOPROLOL SUCCINATE ER 25 MG PO TB24: 12.5000 mg | ORAL_TABLET | Freq: Every day | ORAL | Status: AC

## 2023-10-08 MED ORDER — METOPROLOL TARTRATE 50 MG PO TABS
50.0000 mg | ORAL_TABLET | Freq: Once | ORAL | 0 refills | Status: DC
Start: 2023-10-08 — End: 2023-10-13

## 2023-10-08 NOTE — Patient Instructions (Signed)
Medication Instructions:  HOLD METOPROLOL SUCCINATE AND AMLODIPINE NIGHT BEFORE PROCEDURE  TAKE ONE TIME DOSE OF METOPROLOL TARTRATE 2 HOURS PRIOR TO PROCEDURE  *If you need a refill on your cardiac medications before your next appointment, please call your pharmacy*   Lab Work: BMP TODAY If you have labs (blood work) drawn today and your tests are completely normal, you will receive your results only by: MyChart Message (if you have MyChart) OR A paper copy in the mail If you have any lab test that is abnormal or we need to change your treatment, we will call you to review the results.   Testing/Procedures:[address] Your physician has requested that you have an echocardiogram. Echocardiography is a painless test that uses sound waves to create images of your heart. It provides your doctor with information about the size and shape of your heart and how well your heart's chambers and valves are working. This procedure takes approximately one hour. There are no restrictions for this procedure. Please do NOT wear cologne, perfume, aftershave, or lotions (deodorant is allowed). Please arrive 15 minutes prior to your appointment time.  Please note: We ask at that you not bring children with you during ultrasound (echo/ vascular) testing. Due to room size and safety concerns, children are not allowed in the ultrasound rooms during exams. Our front office staff cannot provide observation of children in our lobby area while testing is being conducted. An adult accompanying a patient to their appointment will only be allowed in the ultrasound room at the discretion of the ultrasound technician under special circumstances. We apologize for any inconvenience.     Your cardiac CT will be scheduled at one of the below locations:   [facility] [address] [place], [place] [address] [phone]  If scheduled at [facility], please arrive at the [facility] and  Children's Entrance (Entrance C2) of [facility] 30 minutes prior to test start time. You can use the FREE valet parking offered at entrance C (encouraged to control the heart rate for the test)  Proceed to the [facility] Radiology Department (first floor) to check-in and test prep.  All radiology patients and guests should use entrance C2 at [facility], accessed from [facility], even though the hospital's physical address listed is [address].      Please follow these instructions carefully (unless otherwise directed):  An IV will be required for this test and Nitroglycerin will be given.  Hold all erectile dysfunction medications at least 3 days (72 hrs) prior to test. (Ie viagra, cialis, sildenafil, tadalafil, etc)   On the Night Before the Test: Be sure to Drink plenty of water. Do not consume any caffeinated/decaffeinated beverages or chocolate 12 hours prior to your test. Do not take any antihistamines 12 hours prior to your test.  On the Day of the Test: Drink plenty of water until 1 hour prior to the test. Do not eat any food 1 hour prior to test. You may take your regular medications prior to the test.  Take metoprolol (Lopressor) two hours prior to test. If you take Furosemide/Hydrochlorothiazide/Spironolactone/Chlorthalidone, please HOLD on the morning of the test. Patients who wear a continuous glucose monitor MUST remove the device prior to scanning. FEMALES- please wear underwire-free bra if available, avoid dresses & tight clothing      After the Test: Drink plenty of water. After receiving IV contrast, you may experience a mild flushed  feeling. This is normal. On occasion, you may experience a mild rash up to 24 hours after the test. This is not dangerous. If this occurs, you can take Benadryl 25 mg and increase your fluid intake. If you experience trouble breathing, this can be serious. If it is severe call 911 IMMEDIATELY.  If it is mild, please call our office.  We will call to schedule your test 2-4 weeks out understanding that some insurance companies will need an authorization prior to the service being performed.   For more information and frequently asked questions, please visit our website : [email or web address]  For non-scheduling related questions, please contact the cardiac imaging nurse navigator should you have any questions/concerns: Cardiac Imaging Nurse Navigators Direct Office Dial: [phone]   For scheduling needs, including cancellations and rescheduling, please call [place], [phone].    Follow-Up: At [facility], you and your health needs are our priority.  As part of our continuing mission to provide you with exceptional heart care, we have created designated Provider Care Teams.  These Care Teams include your primary Cardiologist (physician) and Advanced Practice Providers (APPs -  Physician Assistants and Nurse Practitioners) who all work together to provide you with the care you need, when you need it.  We recommend signing up for the patient portal called "MyChart".  Sign up information is provided on this After Visit Summary.  MyChart is used to connect with patients for Virtual Visits (Telemedicine).  Patients are able to view lab/test results, encounter notes, upcoming appointments, etc.  Non-urgent messages can be sent to your provider as well.   To learn more about what you can do with MyChart, go to [email or web address].    Your next appointment:   6 week(s)  Provider:   [name], PA    Other Instructions

## 2023-10-08 NOTE — Progress Notes (Signed)
Cardiology Office Note:  .   Date:  10/08/2023  ID:  John Choi, DOB 12/10/1950, MRN [id number] PCP: Assunta Found, MD  [facility] Providers Cardiologist:  Tonny Bollman, MD    History of Present Illness: .   John Choi is a 73 y.o. male with past medical history of hypertension, hyperlipidemia, CKD stage III, bifascicular block, tobacco abuse, mild carotid artery disease on ultrasound 06/2014, and AAA s/p EVAR 2015.  Myoview in September 2015 showed subtle inferoapical ischemia, EF 46%, overall low risk.  Subsequent cardiac catheterization performed on 06/11/2014 showed normal coronary arteries.  He previously had pelvic and leg fracture as result of tractor accident.  He was last seen by [name], PA-C on 02/10/2023 at which time he was doing well.  Amlodipine was reduced to 5 mg daily to see if it will help with his lower extremity edema.  He was contacted via virtual visit on 04/29/2023 and is subsequently cleared for laparoscopic cholecystectomy surgery.  He eventually underwent the surgery on 06/03/2023.   More recently, patient contacted cardiology service on 10/04/2023 for evaluation of chest pain.  Patient has been having worsening shortness of breath and left-sided chest discomfort for the past 2 weeks.  Chest pain does not have clear relation with degree of exercise.  His mailbox is about 200 feet away from his front door down hill at the bottom of a slope.  He has not had a any chest discomfort climbing back up the slope.  He denies any exacerbating or alleviating factors.  He has been smoking for a long time and continues to smoke 7 to 8 cigarettes/day.  He likely has undiagnosed pulmonary issue as well however does not have a pulmonologist currently.  I suspect a lot of his shortness of breath may be related to pulmonary issues.  I recommended echocardiogram and coronary CT as initial assessment.  His EGFR is borderline with creatinine of 1.5, eGFR has to be 45 or better  for him to undergo coronary CT.  I will obtain a basic metabolic panel today.  The night before his coronary CT, he has been instructed to hold both metoprolol succinate and amlodipine, we will prescribe him a single dose of 50 mg metoprolol tartrate to be taken 2 hours prior to the coronary CT in the morning.  We will see the patient back in 6 weeks to review study results.  Note, his EKG is unchanged.  Continue to show right bundle branch block and left anterior fascicular block.  Will need to be careful with heart rate in the future.  Case has been discussed with DOD Dr. Flora Lipps.  His blood pressure is elevated even on manual recheck today.  However previously his blood pressure has always been well-controlled in the 110s to 120s.  We decided to hold off on adjusting his blood pressure medication.  ROS:   Patient complains of chest pain and worsening shortness of breath for the past 2 weeks.  He has no lower extremity edema, orthopnea or PND.  Chest pain radiating up to the left neck.  Studies Reviewed: Marland Kitchen   EKG Interpretation Date/Time:  Friday October 08 2023 14:07:36 EST Ventricular Rate:  76 PR Interval:  200 QRS Duration:  156 QT Interval:  422 QTC Calculation: 474 R Axis:   -79  Text Interpretation: Normal sinus rhythm Right bundle branch block Left anterior fascicular block Bifascicular block T wave inversion in the inferior leads When compared with ECG of 21-Jul-2014 00:36,  QRS duration has increased Confirmed by [name] [phone]) on 10/08/2023 2:11:28 PM    Myoview 05/30/2014  Impression Exercise Capacity:  Lexiscan with no exercise. BP Response:  Normal blood pressure response. Clinical Symptoms:  No significant symptoms noted. ECG Impression:  No significant ST segment change suggestive of ischemia. Comparison with Prior Nuclear Study: No significant change from previous study   Overall Impression:  Low risk stress nuclear study Subtle infero apical ischemia .   LV Wall Motion:   NL LV Function; NL Wall Motion    Risk Assessment/Calculations:     HYPERTENSION CONTROL Vitals:   10/08/23 1409 10/08/23 1450  BP: (!) 144/84 (!) 148/86    The patient's blood pressure is elevated above target today.  In order to address the patient's elevated BP: Blood pressure will be monitored at home to determine if medication changes need to be made.          Physical Exam:   VS:  BP (!) 148/86   Pulse 68   Ht 6' (1.829 m)   Wt 184 lb (83.5 kg)   SpO2 93%   BMI 24.95 kg/m    Wt Readings from Last 3 Encounters:  10/08/23 184 lb (83.5 kg)  06/03/23 196 lb (88.9 kg)  05/27/23 196 lb 11.2 oz (89.2 kg)    GEN: Well nourished, well developed in no acute distress NECK: No JVD; No carotid bruits CARDIAC: RRR, no murmurs, rubs, gallops RESPIRATORY:  Clear to auscultation without rales, wheezing or rhonchi  ABDOMEN: Soft, non-tender, non-distended EXTREMITIES:  No edema; No deformity   ASSESSMENT AND PLAN: .    Chest Pain New onset left-sided chest pain for the past two weeks, not clearly associated with exertion. No known edema. EKG unchanged, showing right bundle branch block and left anterior fascicular block. -Order coronary CT and echocardiogram for initial assessment. -Check basic metabolic panel today to ensure eGFR is adequate for coronary CT. -Instruct patient to hold metoprolol succinate and amlodipine the night before coronary CT. -Prescribe single dose of 50mg  metoprolol tartrate to be taken 2 hours prior to coronary CT.  Shortness of Breath Worsening over the past two weeks. Patient is a long-term smoker and likely has undiagnosed pulmonary issues. -Recommend evaluation by a pulmonologist.  Hypertension Blood pressure elevated today, but has been well-controlled in the past. -Continue current regimen and monitor.  Tobacco abuse: advised to stop smoking.       Dispo: Follow-up in 6 weeks  Signed, [name], PA

## 2023-10-09 LAB — BASIC METABOLIC PANEL
BUN/Creatinine Ratio: 12 (ref 10–24)
BUN: 16 mg/dL (ref 8–27)
CO2: 20 mmol/L (ref 20–29)
Calcium: 9.2 mg/dL (ref 8.6–10.2)
Chloride: 106 mmol/L (ref 96–106)
Creatinine, Ser: 1.31 mg/dL — ABNORMAL HIGH (ref 0.76–1.27)
Glucose: 79 mg/dL (ref 70–99)
Potassium: 4.5 mmol/L (ref 3.5–5.2)
Sodium: 141 mmol/L (ref 134–144)
eGFR: 58 mL/min/{1.73_m2} — ABNORMAL LOW (ref >59)

## 2023-10-13 ENCOUNTER — Telehealth: Payer: Self-pay

## 2023-10-13 ENCOUNTER — Other Ambulatory Visit: Payer: Self-pay

## 2023-10-13 DIAGNOSIS — R072 Precordial pain: Secondary | ICD-10-CM

## 2023-10-13 MED ORDER — METOPROLOL TARTRATE 50 MG PO TABS: 50.0000 mg | ORAL_TABLET | Freq: Once | ORAL | 0 refills | Status: AC

## 2023-10-13 NOTE — Telephone Encounter (Addendum)
Call patient regarding results. Patient had understanding of results.----- Message from [name] sent at 10/11/2023  1:47 PM EST ----- Stable renal function to proceed with coronary CT

## 2023-10-20 ENCOUNTER — Telehealth (HOSPITAL_COMMUNITY): Payer: Self-pay | Admitting: *Deleted

## 2023-10-20 NOTE — Telephone Encounter (Signed)
Reaching out to patient to offer assistance regarding upcoming cardiac imaging study; pt verbalizes understanding of appt date/time, parking situation and where to check in, pre-test NPO status and medications ordered, and verified current allergies; name and call back number provided for further questions should they arise Johney Frame RN Navigator Cardiac Imaging [facility] [phone] office [phone] cell

## 2023-10-21 ENCOUNTER — Ambulatory Visit (HOSPITAL_COMMUNITY)
Admission: RE | Admit: 2023-10-21 | Discharge: 2023-10-21 | Disposition: A | Discharge status: Home / Self Care | Payer: Medicare Other | Source: Ambulatory Visit | Attending: Physician Assistant | Admitting: Physician Assistant

## 2023-10-21 DIAGNOSIS — R0609 Other forms of dyspnea: Secondary | ICD-10-CM

## 2023-10-21 DIAGNOSIS — R072 Precordial pain: Secondary | ICD-10-CM | POA: Diagnosis not present

## 2023-10-21 DIAGNOSIS — R079 Chest pain, unspecified: Secondary | ICD-10-CM | POA: Diagnosis not present

## 2023-10-21 MED ORDER — METOPROLOL TARTRATE 5 MG/5ML IV SOLN
INTRAVENOUS | Status: AC
  Filled 2023-10-21: qty 10

## 2023-10-21 MED ORDER — IOHEXOL 350 MG/ML SOLN
95.0000 mL | Freq: Once | INTRAVENOUS | Status: AC | PRN
  Administered 2023-10-21: 95 mL via INTRAVENOUS

## 2023-10-21 MED ORDER — NITROGLYCERIN 0.4 MG SL SUBL
SUBLINGUAL_TABLET | SUBLINGUAL | Status: AC
  Filled 2023-10-21: qty 2

## 2023-10-21 MED ORDER — NITROGLYCERIN 0.4 MG SL SUBL
0.8000 mg | SUBLINGUAL_TABLET | Freq: Once | SUBLINGUAL | Status: AC
  Administered 2023-10-21: 0.8 mg via SUBLINGUAL

## 2023-10-30 ENCOUNTER — Other Ambulatory Visit: Payer: Self-pay

## 2023-10-30 ENCOUNTER — Emergency Department (HOSPITAL_COMMUNITY): Payer: Medicare Other

## 2023-10-30 ENCOUNTER — Encounter (HOSPITAL_COMMUNITY): Payer: Medicare Other

## 2023-10-30 ENCOUNTER — Emergency Department (HOSPITAL_COMMUNITY)
Admission: EM | Admit: 2023-10-30 | Discharge: 2023-10-30 | Disposition: A | Discharge status: Home / Self Care | Payer: Medicare Other | Attending: Emergency Medicine | Admitting: Emergency Medicine

## 2023-10-30 ENCOUNTER — Encounter (HOSPITAL_COMMUNITY): Payer: Self-pay

## 2023-10-30 DIAGNOSIS — R079 Chest pain, unspecified: Secondary | ICD-10-CM | POA: Diagnosis not present

## 2023-10-30 DIAGNOSIS — Z7982 Long term (current) use of aspirin: Secondary | ICD-10-CM | POA: Insufficient documentation

## 2023-10-30 DIAGNOSIS — R918 Other nonspecific abnormal finding of lung field: Secondary | ICD-10-CM | POA: Diagnosis not present

## 2023-10-30 DIAGNOSIS — R42 Dizziness and giddiness: Secondary | ICD-10-CM | POA: Insufficient documentation

## 2023-10-30 DIAGNOSIS — D72829 Elevated white blood cell count, unspecified: Secondary | ICD-10-CM | POA: Insufficient documentation

## 2023-10-30 DIAGNOSIS — J439 Emphysema, unspecified: Secondary | ICD-10-CM | POA: Diagnosis not present

## 2023-10-30 DIAGNOSIS — R0602 Shortness of breath: Secondary | ICD-10-CM | POA: Diagnosis not present

## 2023-10-30 DIAGNOSIS — Z79899 Other long term (current) drug therapy: Secondary | ICD-10-CM | POA: Insufficient documentation

## 2023-10-30 DIAGNOSIS — I509 Heart failure, unspecified: Secondary | ICD-10-CM | POA: Diagnosis not present

## 2023-10-30 DIAGNOSIS — R110 Nausea: Secondary | ICD-10-CM | POA: Diagnosis not present

## 2023-10-30 DIAGNOSIS — I110 Hypertensive heart disease with heart failure: Secondary | ICD-10-CM | POA: Diagnosis not present

## 2023-10-30 DIAGNOSIS — R531 Weakness: Secondary | ICD-10-CM | POA: Diagnosis not present

## 2023-10-30 DIAGNOSIS — R0789 Other chest pain: Secondary | ICD-10-CM | POA: Diagnosis not present

## 2023-10-30 LAB — BASIC METABOLIC PANEL
Anion gap: 11 (ref 5–15)
BUN: 19 mg/dL (ref 8–23)
CO2: 20 mmol/L — ABNORMAL LOW (ref 22–32)
Calcium: 9 mg/dL (ref 8.9–10.3)
Chloride: 108 mmol/L (ref 98–111)
Creatinine, Ser: 1.46 mg/dL — ABNORMAL HIGH (ref 0.61–1.24)
GFR, Estimated: 51 mL/min — ABNORMAL LOW (ref >60)
Glucose, Bld: 109 mg/dL — ABNORMAL HIGH (ref 70–99)
Potassium: 4.2 mmol/L (ref 3.5–5.1)
Sodium: 139 mmol/L (ref 135–145)

## 2023-10-30 LAB — CBC WITH DIFFERENTIAL/PLATELET
Abs Immature Granulocytes: 0.05 10*3/uL (ref 0.00–0.07)
Basophils Absolute: 0.1 10*3/uL (ref 0.0–0.1)
Basophils Relative: 1 %
Eosinophils Absolute: 0.6 10*3/uL — ABNORMAL HIGH (ref 0.0–0.5)
Eosinophils Relative: 4 %
HCT: 52.5 % — ABNORMAL HIGH (ref 39.0–52.0)
Hemoglobin: 17.7 g/dL — ABNORMAL HIGH (ref 13.0–17.0)
Immature Granulocytes: 0 %
Lymphocytes Relative: 5 %
Lymphs Abs: 0.6 10*3/uL — ABNORMAL LOW (ref 0.7–4.0)
MCH: 31.2 pg (ref 26.0–34.0)
MCHC: 33.7 g/dL (ref 30.0–36.0)
MCV: 92.4 fL (ref 80.0–100.0)
Monocytes Absolute: 0.9 10*3/uL (ref 0.1–1.0)
Monocytes Relative: 7 %
Neutro Abs: 11.3 10*3/uL — ABNORMAL HIGH (ref 1.7–7.7)
Neutrophils Relative %: 83 %
Platelets: 233 10*3/uL (ref 150–400)
RBC: 5.68 MIL/uL (ref 4.22–5.81)
RDW: 14 % (ref 11.5–15.5)
WBC: 13.5 10*3/uL — ABNORMAL HIGH (ref 4.0–10.5)
nRBC: 0 % (ref 0.0–0.2)

## 2023-10-30 LAB — TROPONIN I (HIGH SENSITIVITY)
Troponin I (High Sensitivity): 8 ng/L (ref <18)
Troponin I (High Sensitivity): 7 ng/L (ref <18)

## 2023-10-30 LAB — D-DIMER, QUANTITATIVE: D-Dimer, Quant: 3.6 ug{FEU}/mL — ABNORMAL HIGH (ref 0.00–0.50)

## 2023-10-30 MED ORDER — IOHEXOL 350 MG/ML SOLN
75.0000 mL | Freq: Once | INTRAVENOUS | Status: AC | PRN
  Administered 2023-10-30: 75 mL via INTRAVENOUS

## 2023-10-30 NOTE — ED Provider Notes (Signed)
 [facility] Provider Note   CSN: [id number] Arrival date & time: 10/30/23  9073     History  Chief Complaint  Patient presents with   Chest Pain    John Choi is a 73 y.o. male with medical history of PVD, pneumonia, hypertension, hyperlipidemia, dyspnea, CHF, precordial pain.  The patient presents to the ED for evaluation of chest pain.  Patient reports that this morning he woke up out of his sleep at 7:30 AM with left-sided chest pain that radiated to his jaw.  Patient also noted to be diaphoretic and dyspneic at this time.  States that his symptoms worsened when he got up to move around.  Endorsing lightheadedness, dizziness, weakness, nausea.  Denies vomiting, abdominal pain, diarrhea, fevers, leg swelling.  Patient reports he feels as if he is having a heart attack.  Patient had recent CT coronary which showed minimal nonobstructive CAD.  He follows with Dr. Wonda of cardiology.  Currently he states his chest pain and shortness of breath have resolved.  Denies any current symptoms.   Chest Pain Associated symptoms: diaphoresis, dizziness, nausea, shortness of breath and weakness   Associated symptoms: no abdominal pain and no vomiting        Home Medications Prior to Admission medications   Medication Sig Start Date End Date Taking? Authorizing Provider  albuterol  (PROVENTIL  HFA;VENTOLIN  HFA) 108 (90 BASE) MCG/ACT inhaler Inhale 2 puffs into the lungs every 6 (six) hours as needed for wheezing or shortness of breath.    [provider]  amLODipine  (NORVASC ) 5 MG tablet Take 1 tablet (5 mg total) by mouth daily. 10/08/23 01/06/24  Meng, Hao, PA  aspirin  EC 81 MG tablet Take 81 mg by mouth at bedtime.     [provider]  azelastine (OPTIVAR) 0.05 % ophthalmic solution Place 1 drop into both eyes 2 (two) times daily as needed (burning eyes). 09/06/20   [provider]  clotrimazole-betamethasone (LOTRISONE)  cream Apply 1 application  topically daily as needed (eczema). 11/02/18   [provider]  EPINEPHrine  (PRIMATENE  MIST IN) Inhale 1 puff into the lungs as needed (shortness of breath).    [provider]  metoprolol  succinate (TOPROL -XL) 25 MG 24 hr tablet Take 0.5 tablets (12.5 mg total) by mouth daily. 10/08/23   Meng, Hao, PA  metoprolol  tartrate (LOPRESSOR ) 50 MG tablet Take 1 tablet (50 mg total) by mouth once for 1 dose. TAKE 2 HOURS PRIOR TO CORONARY CT 10/13/23 10/13/23  Meng, Hao, PA  ondansetron  (ZOFRAN ) 4 MG tablet Take 1 tablet (4 mg total) by mouth every 6 (six) hours. 04/06/23   Kingsley, Victoria K, DO  rosuvastatin  (CRESTOR ) 20 MG tablet TAKE 1 TABLET BY MOUTH EVERY DAY 02/10/22   Wonda Sharper, MD  traMADol  (ULTRAM ) 50 MG tablet Take 1 tablet (50 mg total) by mouth every 6 (six) hours as needed. 06/03/23 06/02/24  Rubin Calamity, MD      Allergies    Patient has no known allergies.    Review of Systems   Review of Systems  Constitutional:  Positive for diaphoresis.  Respiratory:  Positive for shortness of breath.   Cardiovascular:  Positive for chest pain.  Gastrointestinal:  Positive for nausea. Negative for abdominal pain, diarrhea and vomiting.  Neurological:  Positive for dizziness, weakness and light-headedness.  All other systems reviewed and are negative.   Physical Exam Updated Vital Signs BP (!) 143/89   Pulse 77   Temp (!)  97.5 F (36.4 C)   Resp 17   Ht 6' (1.829 m)   Wt 83.5 kg   SpO2 91%   BMI 24.95 kg/m  Physical Exam Vitals and nursing note reviewed.  Constitutional:      General: He is not in acute distress.    Appearance: Normal appearance. He is not ill-appearing, toxic-appearing or diaphoretic.  HENT:     Head: Normocephalic and atraumatic.     Nose: Nose normal.     Mouth/Throat:     Mouth: Mucous membranes are moist.     Pharynx: Oropharynx is clear.  Eyes:     Extraocular Movements: Extraocular movements intact.      Conjunctiva/sclera: Conjunctivae normal.     Pupils: Pupils are equal, round, and reactive to light.  Cardiovascular:     Rate and Rhythm: Normal rate and regular rhythm.  Pulmonary:     Effort: Pulmonary effort is normal.     Breath sounds: Normal breath sounds. No wheezing.  Abdominal:     General: Abdomen is flat. Bowel sounds are normal.     Palpations: Abdomen is soft.     Tenderness: There is no abdominal tenderness.  Musculoskeletal:     Cervical back: Normal range of motion and neck supple. No tenderness.     Right lower leg: No edema.     Left lower leg: No edema.  Skin:    General: Skin is warm and dry.     Capillary Refill: Capillary refill takes less than 2 seconds.  Neurological:     General: No focal deficit present.     Mental Status: He is alert and oriented to person, place, and time.     GCS: GCS eye subscore is 4. GCS verbal subscore is 5. GCS motor subscore is 6.     Cranial Nerves: Cranial nerves 2-12 are intact. No cranial nerve deficit.     Sensory: Sensation is intact. No sensory deficit.     Motor: Motor function is intact. No weakness.     Coordination: Coordination is intact. Heel to [facility] Test normal.     Comments: 5 out of 5 strength bilateral upper extremities.  5 out of 5 strain bilateral lower extremities.     ED Results / Procedures / Treatments   Labs (all labs ordered are listed, but only abnormal results are displayed) Labs Reviewed  BASIC METABOLIC PANEL - Abnormal; Notable for the following components:      Result Value   CO2 20 (*)    Glucose, Bld 109 (*)    Creatinine, Ser 1.46 (*)    GFR, Estimated 51 (*)    All other components within normal limits  CBC WITH DIFFERENTIAL/PLATELET - Abnormal; Notable for the following components:   WBC 13.5 (*)    Hemoglobin 17.7 (*)    HCT 52.5 (*)    Neutro Abs 11.3 (*)    Lymphs Abs 0.6 (*)    Eosinophils Absolute 0.6 (*)    All other components within normal limits  D-DIMER, QUANTITATIVE -  Abnormal; Notable for the following components:   D-Dimer, Quant 3.60 (*)    All other components within normal limits  TROPONIN I (HIGH SENSITIVITY)  TROPONIN I (HIGH SENSITIVITY)    EKG EKG Interpretation Date/Time:  Saturday October 30 2023 09:38:12 EST Ventricular Rate:  92 PR Interval:  178 QRS Duration:  152 QT Interval:  422 QTC Calculation: 521 R Axis:   -79  Text Interpretation: Normal sinus rhythm Right bundle branch  block Left anterior fascicular block Bifascicular block Minimal voltage criteria for LVH, may be normal variant ( R in aVL ) Abnormal ECG When compared with ECG of 08-Oct-2023 14:07, No significant change since last tracing Confirmed by Towana Sharper [phone]) on 10/30/2023 9:50:43 AM  Radiology CT Angio Chest PE W and/or Wo Contrast Result Date: 10/30/2023 CLINICAL DATA:  Chest pain and shortness of breath. Concern for pulmonary embolism. EXAM: CT ANGIOGRAPHY CHEST WITH CONTRAST TECHNIQUE: Multidetector CT imaging of the chest was performed using the standard protocol during bolus administration of intravenous contrast. Multiplanar CT image reconstructions and MIPs were obtained to evaluate the vascular anatomy. RADIATION DOSE REDUCTION: This exam was performed according to the departmental dose-optimization program which includes automated exposure control, adjustment of the mA and/or kV according to patient size and/or use of iterative reconstruction technique. CONTRAST:  75mL OMNIPAQUE  IOHEXOL  350 MG/ML SOLN COMPARISON:  Chest radiograph dated 10/30/2023. FINDINGS: Cardiovascular: There is no cardiomegaly or pericardial effusion. Mild atherosclerotic calcification of the thoracic aorta. No aneurysmal dilatation or dissection. No pulmonary artery embolus identified. Mediastinum/Nodes: There is no hilar or mediastinal adenopathy. The esophagus and the thyroid  gland are grossly unremarkable. No mediastinal fluid collection. Lungs/Pleura: Background of emphysema. Areas of  scarring in the right middle lobe and lingula. No pleural effusion or pneumothorax. The central airways are patent. Upper Abdomen: No acute abnormality. Musculoskeletal: No acute osseous pathology. Review of the MIP images confirms the above findings. IMPRESSION: No acute intrathoracic pathology. No CT evidence of pulmonary artery embolus. Aortic Atherosclerosis (ICD10-I70.0) and Emphysema (ICD10-J43.9). Electronically Signed   By: Vanetta Chou M.D.   On: 10/30/2023 13:58   DG Chest 2 View Result Date: 10/30/2023 CLINICAL DATA:  73 year old male with chest pain. EXAM: CHEST - 2 VIEW COMPARISON:  Cardiac CT 10/21/2023 and earlier. FINDINGS: PA and lateral views 0946 hours. Large lung volumes, increased compared to 2015. Chronic but increased lower lobe streaky opacity, similar to atelectasis/scarring on the CT last month. Background increased pulmonary interstitium which is mildly progressed from 2018. But the appearance is very similar to the CT scout view last month. No pneumothorax. No pleural effusion or consolidation. Mediastinal contours are stable since 2015, within normal limits. Visualized tracheal air column is within normal limits. No acute osseous abnormality identified. Negative visible bowel gas. IMPRESSION: Constellation of findings favors progression of chronic interstitial lung disease since 2018. No superimposed acute cardiopulmonary abnormality identified. Electronically Signed   By: VEAR Hurst M.D.   On: 10/30/2023 10:06    Procedures Procedures    Medications Ordered in ED Medications  iohexol  (OMNIPAQUE ) 350 MG/ML injection 75 mL (75 mLs Intravenous Contrast Given 10/30/23 1350)    ED Course/ Medical Decision Making/ A&P    Medical Decision Making Amount and/or Complexity of Data Reviewed Labs: ordered. Radiology: ordered.  Risk Prescription drug management.   73 year old male presents for evaluation.  Please see HPI for further details.  On examination the patient is  afebrile and nontachycardic.  His lung sounds are clear bilaterally, he is not hypoxic.  Abdomen soft and compressible throughout.  Neurological examinations at baseline with no focal neurodeficits.  5 out of 5 strength bilateral upper extremities.  5 out of 5 strength bilateral lower extremities.  No edema to bilateral lower extremities.  Overall nontoxic in appearance.  Will assess patient with CBC, BMP, D-dimer, troponin x 2, EKG, chest x-ray.  After my initial exam, patient reports that he has also left calf pain so we will add on DVT study  of left lower extremity.  CBC with white count elevation of 13.5, hemoglobin 17.7.  Patient not febrile or tachycardic.  Could be secondary to stress response.  Metabolic panel with baseline creatinine 1.46, bicarb 20, glucose 109.  No electrolyte derangement.  Anion gap 11.  Patient troponin 8, 7 with delta.  Patient D-dimer is elevated at 3.6.  Will add on CT angiogram.  Chest x-ray unremarkable.  EKG nonischemic.  CTA negative for PE.  Patient states that he would wish to go home at this time.  I have ordered an ultrasound study of his left lower extremity at this time but he is requesting discharge so we will cancel this.  At this time, patient requesting discharge.  Denies any active chest pain at this time.  Will have patient follow-up with cardiology team.  Patient had CT coronary done on 10/21/2023 which showed no evidence of obstructive CAD.  Stable to discharge home.   Final Clinical Impression(s) / ED Diagnoses Final diagnoses:  Chest pain, unspecified type    Rx / DC Orders ED Discharge Orders     None         Ruthell Lonni JULIANNA DEVONNA 10/30/23 1419    Towana Ozell BROCKS, MD 10/30/23 1744

## 2023-10-30 NOTE — ED Triage Notes (Signed)
 Reports started having chest pain that goes into his jaw aroun [id number].  Had ems come out and took 324mg  ASA.  Patient reports hx asthma but had a CT of his heart here the other day and has an appt with cards this coming week.

## 2023-10-30 NOTE — ED Notes (Addendum)
 Attempted PIV without success. Tolerated poorly. Will call phlebotomy for ordered labs.

## 2023-10-30 NOTE — Discharge Instructions (Signed)
 It was a pleasure taking part in your care.  As we discussed, your workup is reassuring.  Your troponins were negative.  Your CT scan of your chest showed no evidence of blood clots.  Your EKG was also reassuring.  Please follow-up with cardiology this week for further management and care.  Please return to the ED with any new or worsening symptoms.  Please continue taking all medication as prescribed.

## 2023-10-30 NOTE — ED Notes (Signed)
 Patient transported to CT

## 2023-10-30 NOTE — ED Notes (Signed)
This RN attempted IV access without success.  ?

## 2023-11-03 ENCOUNTER — Ambulatory Visit (HOSPITAL_COMMUNITY): Payer: Medicare Other | Attending: Physician Assistant

## 2023-11-03 DIAGNOSIS — R0609 Other forms of dyspnea: Secondary | ICD-10-CM | POA: Insufficient documentation

## 2023-11-03 DIAGNOSIS — R072 Precordial pain: Secondary | ICD-10-CM | POA: Insufficient documentation

## 2023-11-03 LAB — ECHOCARDIOGRAM COMPLETE
Area-P 1/2: 2.73 cm2
S' Lateral: 3.81 cm

## 2023-11-19 ENCOUNTER — Ambulatory Visit: Payer: Medicare Other | Attending: Physician Assistant | Admitting: Physician Assistant

## 2023-11-19 VITALS — BP 160/96 | HR 83 | Ht 72.0 in | Wt 185.8 lb

## 2023-11-19 DIAGNOSIS — R079 Chest pain, unspecified: Secondary | ICD-10-CM | POA: Insufficient documentation

## 2023-11-19 DIAGNOSIS — E782 Mixed hyperlipidemia: Secondary | ICD-10-CM | POA: Insufficient documentation

## 2023-11-19 DIAGNOSIS — I10 Essential (primary) hypertension: Secondary | ICD-10-CM | POA: Diagnosis not present

## 2023-11-19 NOTE — Progress Notes (Signed)
 Cardiology Office Note:  .   Date:  11/19/2023  ID:  MATAEO INGWERSEN, DOB 03-01-1951, MRN [id number] PCP: Assunta Found, MD  [facility] Providers Cardiologist:  Tonny Bollman, MD     History of Present Illness: .   John Choi is a 73 y.o. male with past medical history of hypertension, hyperlipidemia, CKD stage III, bifascicular block, tobacco abuse, mild carotid artery disease on ultrasound 06/2014, and AAA s/p EVAR 2015.  Myoview in September 2015 showed subtle inferoapical ischemia, EF 46%, overall low risk.  Subsequent cardiac catheterization performed on 06/11/2014 showed normal coronary arteries.  He previously had pelvic and leg fracture as result of tractor accident.  He underwent laparoscopic cholecystectomy surgery in September 2024.  He contacted cardiology service in January 2025 for chest pain and shortness of breath for 2 weeks.  Shortness of breath may be related to smoking history.  He does not have a pulmonologist.  I recommended a echocardiogram and coronary CT.  Coronary CTA obtained on 10/21/2023 showed chronic lung base scarring and atelectasis, coronary calcium score of 111 which placed the patient in 41st percentile for age and sex matched control, minimal plaque in the LAD and left circumflex with no significant coronary artery disease.  Echocardiogram obtained on 11/03/2023 showed EF 45 to 50%, grade 1 DD, dilated aortic root measuring at 40 mm.  Since the last visit, patient presented to the emergency room with chest pain and shortness of breath.  D-dimer was elevated, troponin negative.  CTA of chest was negative for PE but does show basilar scar and atelectasis.  Patient presents today for follow-up.  We have reviewed the recent echocardiogram and coronary CT.  Since the ED visit, he has not had any chest pain or shortness of breath.  He is determined to quit smoking, I offered to refer him to pulmonology service, however he wished to delay that until he  successfully quit smoking at this time.  He has no lower extremity edema, orthopnea or PND.  Blood pressure remained elevated today, he says he is quite anxious and frustrated after reading the news lately.  His blood pressure previously is very well-controlled.  I decided to hold off on adjusting his blood pressure medication for now.  He does have a blood pressure cuff at home.  I asked him to monitor his systolic blood pressure, if systolic blood pressure still greater than 140 mmHg after 2 weeks, he has been instructed to increase amlodipine to 10 mg daily and to contact cardiology service so we can send the higher strength amlodipine to his pharmacy.  Otherwise, he can follow-up in 6 months with [name].  ROS:   He denies chest pain, palpitations, dyspnea, pnd, orthopnea, n, v, dizziness, syncope, edema, weight gain, or early satiety. All other systems reviewed and are otherwise negative except as noted above.    Studies Reviewed: .        Cardiac Studies & Procedures   ______________________________________________________________________________________________     ECHOCARDIOGRAM  ECHOCARDIOGRAM COMPLETE 11/03/2023  Narrative ECHOCARDIOGRAM REPORT    Patient Name:   John Choi Date of Exam: 11/03/2023 Medical Rec #:  [id number]       Height:       72.0 in Accession #:    [id number]      Weight:       184.0 lb Date of Birth:  Apr 17, 1951       BSA:          2.056 m  Patient Age:    72 years        BP:           143/89 mmHg Patient Gender: M               HR:           63 bpm. Exam Location:  [address]  Procedure: 2D Echo and 3D Echo (Both Spectral and Color Flow Doppler were utilized during procedure).  Indications:    DOE (dyspnea on exertion) [R06.09] Precordial pain [R07.2]  History:        Patient has prior history of Echocardiogram examinations, most recent 10/19/2011. CHF, Signs/Symptoms:Chest Pain and Dyspnea; Risk Factors:Hypertension.  Sonographer:     Eulah Pont RDCS Referring Phys: [phone] Kamryn Gauthier  IMPRESSIONS   1. Left ventricular ejection fraction, by estimation, is 45 to 50%. Left ventricular ejection fraction by 3D volume is 45 %. The left ventricle has mildly decreased function. The left ventricle demonstrates global hypokinesis. Left ventricular diastolic parameters are consistent with Grade I diastolic dysfunction (impaired relaxation). 2. Right ventricular systolic function is mildly reduced. The right ventricular size is normal. Tricuspid regurgitation signal is inadequate for assessing PA pressure. 3. The mitral valve is grossly normal. Trivial mitral valve regurgitation. No evidence of mitral stenosis. 4. The aortic valve is tricuspid. There is mild calcification of the aortic valve. There is mild thickening of the aortic valve. Aortic valve regurgitation is not visualized. Aortic valve sclerosis is present, with no evidence of aortic valve stenosis. 5. Aortic dilatation noted. There is mild dilatation of the aortic root, measuring 40 mm. 6. The inferior vena cava is normal in size with greater than 50% respiratory variability, suggesting right atrial pressure of 3 mmHg.  FINDINGS Left Ventricle: Left ventricular ejection fraction, by estimation, is 45 to 50%. Left ventricular ejection fraction by 3D volume is 45 %. The left ventricle has mildly decreased function. The left ventricle demonstrates global hypokinesis. Strain imaging was not performed. The left ventricular internal cavity size was normal in size. There is no left ventricular hypertrophy. Left ventricular diastolic parameters are consistent with Grade I diastolic dysfunction (impaired relaxation).  Right Ventricle: The right ventricular size is normal. No increase in right ventricular wall thickness. Right ventricular systolic function is mildly reduced. Tricuspid regurgitation signal is inadequate for assessing PA pressure.  Left Atrium: Left atrial size was  normal in size.  Right Atrium: Right atrial size was normal in size.  Pericardium: There is no evidence of pericardial effusion. Presence of epicardial fat layer.  Mitral Valve: The mitral valve is grossly normal. Trivial mitral valve regurgitation. No evidence of mitral valve stenosis.  Tricuspid Valve: The tricuspid valve is grossly normal. Tricuspid valve regurgitation is trivial. No evidence of tricuspid stenosis.  Aortic Valve: The aortic valve is tricuspid. There is mild calcification of the aortic valve. There is mild thickening of the aortic valve. Aortic valve regurgitation is not visualized. Aortic valve sclerosis is present, with no evidence of aortic valve stenosis.  Pulmonic Valve: The pulmonic valve was grossly normal. Pulmonic valve regurgitation is not visualized. No evidence of pulmonic stenosis.  Aorta: Aortic dilatation noted. There is mild dilatation of the aortic root, measuring 40 mm.  Venous: The inferior vena cava is normal in size with greater than 50% respiratory variability, suggesting right atrial pressure of 3 mmHg.  IAS/Shunts: The atrial septum is grossly normal.  Additional Comments: 3D was performed not requiring image post processing on an independent workstation and  was abnormal.   LEFT VENTRICLE PLAX 2D LVIDd:         4.98 cm         Diastology LVIDs:         3.81 cm         LV e' medial:    6.49 cm/s LV PW:         1.03 cm         LV E/e' medial:  8.8 LV IVS:        1.04 cm         LV e' lateral:   8.68 cm/s LVOT diam:     2.09 cm         LV E/e' lateral: 6.6 LV SV:         79 LV SV Index:   38 LVOT Area:     3.43 cm        3D Volume EF LV 3D EF:    Left ventricul ar ejection fraction by 3D volume is 45 %.  3D Volume EF: 3D EF:        45 % LV EDV:       164 ml LV ESV:       90 ml LV SV:        74 ml  RIGHT VENTRICLE RV S prime:     8.92 cm/s TAPSE (M-mode): 1.4 cm  LEFT ATRIUM             Index        RIGHT ATRIUM            Index LA diam:        3.00 cm 1.46 cm/m   RA Area:     15.30 cm LA Vol (A2C):   44.8 ml 21.79 ml/m  RA Volume:   45.10 ml  21.93 ml/m LA Vol (A4C):   41.9 ml 20.38 ml/m LA Biplane Vol: 44.4 ml 21.59 ml/m AORTIC VALVE LVOT Vmax:   107.00 cm/s LVOT Vmean:  68.300 cm/s LVOT VTI:    0.230 m  AORTA Ao Root diam: 3.97 cm Ao Asc diam:  3.62 cm  MITRAL VALVE MV Area (PHT): 2.73 cm    SHUNTS MV Decel Time: 278 msec    Systemic VTI:  0.23 m MV E velocity: 57.20 cm/s  Systemic Diam: 2.09 cm MV A velocity: 88.80 cm/s MV E/A ratio:  0.64  Lennie Odor MD Electronically signed by Lennie Odor MD Signature Date/Time: 11/03/2023/12:21:32 PM    Final      CT SCANS  CT CORONARY MORPH W/CTA COR W/SCORE 10/21/2023  Addendum 10/30/2023 10:04 AM ADDENDUM REPORT: 10/30/2023 10:02  EXAM: OVER-READ INTERPRETATION  CT CHEST  The following report is an over-read performed by radiologist Dr. Jenetta Loges [facility] on 10/30/2023. This over-read does not include interpretation of cardiac or coronary anatomy or pathology. The coronary CTA interpretation by the cardiologist is attached.  COMPARISON:  Chest CT 08/14/2019.  FINDINGS: Stable lung volumes since 2020. Mild chronic curvilinear atelectasis or scarring in the bilateral lower lobes and middle lobes, only mildly progressed since 2020. No pleural effusion. Visible major airways are patent. No convincing acute lung inflammation.  Flowing thoracic endplate osteophytes and interbody ankylosis.  Stable and negative visible upper abdominal viscera.  Calcified aortic atherosclerosis. No lymphadenopathy in the visible mediastinum.  IMPRESSION: 1. No acute or inflammatory process in the visible Chest. 2. Chronic lung base scarring and atelectasis. 3.  Aortic Atherosclerosis (ICD10-I70.0).   Electronically  Signed By: Odessa Fleming M.D. On: 10/30/2023 10:02  Narrative CLINICAL DATA:  Chest pain  EXAM: Cardiac/Coronary  CTA  TECHNIQUE: A non-contrast, gated CT scan was obtained with axial slices of 3 mm through the heart for calcium scoring. Calcium scoring was performed using the Agatston method. A 120 kV prospective, gated, contrast cardiac scan was obtained. Gantry rotation speed was 250 msecs and collimation was 0.6 mm. Two sublingual nitroglycerin tablets (0.8 mg) were given. The 3D data set was reconstructed in 5% intervals of the 35-75% of the R-R cycle. Diastolic phases were analyzed on a dedicated workstation using MPR, MIP, and VRT modes. The patient received 95 cc of contrast.  FINDINGS: Image quality: Excellent.  Noise artifact is: Limited.  Coronary Arteries:  Normal coronary origin.  Left dominance.  Left main: The left main is a large caliber vessel with a normal take off from the left coronary cusp that bifurcates to form a left anterior descending artery and a left circumflex artery. There is minimal mixed density plaque (<25%).  Left anterior descending artery: The ostial LAD contains minimal mixed density plaque (<25%). The LAD gives off 2 patent diagonal branches.  Left circumflex artery: The LCX is dominant. There is minimal mixed density plaque (<25%). The LCX gives off 3 patent obtuse marginal branches. The LCX terminates as a patent PDA.  Right coronary artery: The RCA is non-dominant with normal take off from the right coronary cusp. There is no evidence of plaque or stenosis.  Right Atrium: Right atrial size is within normal limits.  Right Ventricle: The right ventricular cavity is within normal limits.  Left Atrium: Left atrial size is normal in size with no left atrial appendage filling defect.  Left Ventricle: The ventricular cavity size is within normal limits.  Pulmonary arteries: Normal in size.  Pulmonary veins: Normal pulmonary venous drainage.  Pericardium: Normal thickness without significant effusion or calcium present.  Cardiac valves: The  aortic valve is trileaflet without significant calcification. The mitral valve is normal without significant calcification.  Aorta: Normal caliber without significant disease.  Extra-cardiac findings: See attached radiology report for non-cardiac structures.  IMPRESSION: 1. Coronary calcium score of 111. This was 41st percentile for age-, sex, and race-matched controls.  2. Total plaque volume 202 mm3 which is 15th percentile for age- and sex-matched controls (calcified plaque 30 mm3; non-calcified plaque 172 mm3). TPV is moderate.  3. Normal coronary origin with left dominance.  4. Minimal mixed density plaque in the LAD and LCX (<25%).  RECOMMENDATIONS: 1. CAD-RADS 1: Minimal non-obstructive CAD (0-24%). Consider non-atherosclerotic causes of chest pain. Consider preventive therapy and risk factor modification.  Lennie Odor, MD  Electronically Signed: By: Lennie Odor M.D. On: 10/21/2023 19:59     ______________________________________________________________________________________________      Risk Assessment/Calculations:            Physical Exam:   VS:  BP (!) 160/96 (BP Location: Left Arm, Patient Position: Sitting, Cuff Size: Normal)   Pulse 83   Ht 6' (1.829 m)   Wt 185 lb 12.8 oz (84.3 kg)   SpO2 94%   BMI 25.20 kg/m    Wt Readings from Last 3 Encounters:  11/19/23 185 lb 12.8 oz (84.3 kg)  10/30/23 184 lb (83.5 kg)  10/08/23 184 lb (83.5 kg)    GEN: Well nourished, well developed in no acute distress NECK: No JVD; No carotid bruits CARDIAC: RRR, no murmurs, rubs, gallops RESPIRATORY:  Clear to auscultation without rales, wheezing or rhonchi  ABDOMEN: Soft, non-tender, non-distended EXTREMITIES:  No edema; No deformity   ASSESSMENT AND PLAN: .    Chest pain: Recent coronary CT showed minimal plaque.  CT of the chest was negative for PE as well.  Echocardiogram obtained on 11/03/2023 showed borderline ejection fraction of 45 to 50%.  Continue  to monitor.  Hypertension: Blood pressure elevated today, however was previously controlled.  He attributed recent high blood pressure due to anxiety.  I asked the patient to continue to monitor the blood pressure for the next 2 weeks, if persistently high, he has been instructed to increase amlodipine to 10 mg daily.  Hyperlipidemia: On rosuvastatin       Dispo: Follow-up in 6 months  Signed, [name], [place]

## 2023-11-19 NOTE — Patient Instructions (Signed)
 Medication Instructions:  NO CHANGES TODAY, IF SYSTOLIC BLOOD PRESSURE IS PERSISTENTLY GREATER THAN 140 FOR THE NEXT 2 WEEKS GIVE OFFICE A CALL SO NEW PRESCRIPTION OF HIGHER DOSE OF AMLODIPINE CAN BE SENT TO PHARMACY *If you need a refill on your cardiac medications before your next appointment, please call your pharmacy*   Lab Work: NO LABS If you have labs (blood work) drawn today and your tests are completely normal, you will receive your results only by: MyChart Message (if you have MyChart) OR A paper copy in the mail If you have any lab test that is abnormal or we need to change your treatment, we will call you to review the results.   Testing/Procedures: NO TESTING   Follow-Up: At [facility], you and your health needs are our priority.  As part of our continuing mission to provide you with exceptional heart care, we have created designated Provider Care Teams.  These Care Teams include your primary Cardiologist (physician) and Advanced Practice Providers (APPs -  Physician Assistants and Nurse Practitioners) who all work together to provide you with the care you need, when you need it.   Your next appointment:   6 month(s)  Provider:   [name], MD   Other Instructions

## 2024-02-02 DIAGNOSIS — J9801 Acute bronchospasm: Secondary | ICD-10-CM | POA: Diagnosis not present

## 2024-02-02 DIAGNOSIS — Z6826 Body mass index (BMI) 26.0-26.9, adult: Secondary | ICD-10-CM | POA: Diagnosis not present

## 2024-02-02 DIAGNOSIS — E663 Overweight: Secondary | ICD-10-CM | POA: Diagnosis not present

## 2024-02-09 ENCOUNTER — Other Ambulatory Visit: Payer: Self-pay | Admitting: *Deleted

## 2024-02-09 DIAGNOSIS — Z95828 Presence of other vascular implants and grafts: Secondary | ICD-10-CM

## 2024-02-21 ENCOUNTER — Ambulatory Visit: Payer: Medicare Other | Admitting: Physician Assistant

## 2024-02-21 ENCOUNTER — Ambulatory Visit (HOSPITAL_COMMUNITY)
Admission: RE | Admit: 2024-02-21 | Discharge: 2024-02-21 | Disposition: A | Discharge status: Home / Self Care | Payer: Medicare Other | Source: Ambulatory Visit | Attending: Surgery | Admitting: Surgery

## 2024-02-21 VITALS — BP 123/82 | HR 78 | Temp 98.5°F | Ht 72.0 in | Wt 185.5 lb

## 2024-02-21 DIAGNOSIS — F17200 Nicotine dependence, unspecified, uncomplicated: Secondary | ICD-10-CM | POA: Diagnosis not present

## 2024-02-21 DIAGNOSIS — Z95828 Presence of other vascular implants and grafts: Secondary | ICD-10-CM | POA: Insufficient documentation

## 2024-02-21 NOTE — Progress Notes (Signed)
 HISTORY AND PHYSICAL     CC:  follow up. For EVAR Requesting Provider:  Minus Amel, MD  HPI: This is a 73 y.o. male who is here today for follow up for AAA and is s/p EVAR on  07/20/2014 by Dr. Charlotte Cookey.  Pt was last seen 01/18/2023 and at that time, he was not having any claudication, rest pain or non healing wounds.  He was having some edema of BLE but this was not bothersome.  He was instructed to wear compression and elevate his legs.   The pt returns today for follow up studies.  He states he had some pain around his belly button last night, but after going to the bathroom this morning, this pain resolved.  He has pain with hernia and this is unchanged.  He otherwise denies any abdominal or back pain.   He denies any claudication, rest pain or non healing wounds.  He states he is still smoking and unable to quit.  He has had other vices in the past when he was young and was able to quit those, but smoking has been very difficult to quit.    Pt is a veteran of [place].    He does have a daughter who is 50 y/o.  She does not smoke.   The pt is on a statin for cholesterol management.    The pt is on an aspirin .    Other AC:  none The pt is on CCB, BB for hypertension.  The pt is not on medication for diabetes. Tobacco hx:  current    Past Medical History:  Diagnosis Date   Arthritis    patient stated   Asthma    Cancer (HCC)    skin   CHF (congestive heart failure) (HCC)    patient denies   Dyspnea    per patient d/t asthma   Head trauma    spring of 1972   High cholesterol    History of kidney stones    Hypertension    Peripheral vascular disease (HCC)    Pneumonia    Pubic ramus fracture (HCC) 02/2022   left; thrown from tractor/notes 03/05/2017    Past Surgical History:  Procedure Laterality Date   ABDOMINAL AORTIC ENDOVASCULAR STENT GRAFT N/A 07/20/2014   Procedure: ABDOMINAL AORTIC ENDOVASCULAR STENT GRAFT;  Surgeon: Margherita Shell, MD;  Location: [facility] OR;   Service: Vascular;  Laterality: N/A;   BREAST SURGERY     CARDIAC CATHETERIZATION  05/2014   CHOLECYSTECTOMY N/A 06/03/2023   Procedure: LAPAROSCOPIC CHOLECYSTECTOMY;  Surgeon: Shela Derby, MD;  Location: [facility] OR;  Service: General;  Laterality: N/A;   COLONOSCOPY W/ POLYPECTOMY  1997   CYSTOSCOPY W/ STONE MANIPULATION  1980's and 1990's X 4   LAPAROSCOPIC ASSISTED VENTRAL HERNIA REPAIR     "right above my bellybutton"   LEFT HEART CATHETERIZATION WITH CORONARY ANGIOGRAM N/A 06/11/2014   Procedure: LEFT HEART CATHETERIZATION WITH CORONARY ANGIOGRAM;  Surgeon: Arlander Bellman, MD;  Location: [facility] C[facility] LAB;  Service: Cardiovascular;  Laterality: N/A;   LUMBAR DISC SURGERY  X 2   "# 1, #2"   LUMBAR SPINE HARDWARE REMOVAL     "#4"   POSTERIOR LUMBAR FUSION     "#3"   VENTRAL HERNIA REPAIR N/A 12/31/2020   Procedure: LAPAROSCOPIC VENTRAL HERNIA REPAIR WITH MESH;  Surgeon: Shela Derby, MD;  Location: [facility] ORS;  Service: General;  Laterality: N/A;  90/RM4    No Known Allergies  Current Outpatient Medications  Medication Sig Dispense Refill   albuterol  (PROVENTIL  HFA;VENTOLIN  HFA) 108 (90 BASE) MCG/ACT inhaler Inhale 2 puffs into the lungs every 6 (six) hours as needed for wheezing or shortness of breath.     amLODipine  (NORVASC ) 5 MG tablet Take 1 tablet (5 mg total) by mouth daily. 90 tablet 3   aspirin  EC 81 MG tablet Take 81 mg by mouth at bedtime.      azelastine (OPTIVAR) 0.05 % ophthalmic solution Place 1 drop into both eyes 2 (two) times daily as needed (burning eyes).     clotrimazole-betamethasone (LOTRISONE) cream Apply 1 application  topically daily as needed (eczema).     EPINEPHrine  (PRIMATENE  MIST IN) Inhale 1 puff into the lungs as needed (shortness of breath).     metoprolol  succinate (TOPROL -XL) 25 MG 24 hr tablet Take 0.5 tablets (12.5 mg total) by mouth daily.     metoprolol  tartrate (LOPRESSOR ) 50 MG tablet Take 1 tablet (50 mg total) by mouth once for 1 dose. TAKE 2  HOURS PRIOR TO CORONARY CT 1 tablet 0   ondansetron  (ZOFRAN ) 4 MG tablet Take 1 tablet (4 mg total) by mouth every 6 (six) hours. (Patient not taking: Reported on 11/19/2023) 12 tablet 0   rosuvastatin  (CRESTOR ) 20 MG tablet TAKE 1 TABLET BY MOUTH EVERY DAY 90 tablet 3   traMADol  (ULTRAM ) 50 MG tablet Take 1 tablet (50 mg total) by mouth every 6 (six) hours as needed. 20 tablet 0   No current facility-administered medications for this visit.    Family History  Problem Relation Age of Onset   Heart disease Mother 3       Before age 22   Heart attack Mother    Heart attack Father    Stroke Father    Heart disease Father        Before age 28   Cancer Sister        Brain   Heart disease Sister        Before age 34   Diabetes Sister    Cancer Brother        Throat, Liver, Pancreatic   Stroke Brother    Diabetes Brother     Social History   Socioeconomic History   Marital status: Divorced    Spouse name: Not on file   Number of children: Not on file   Years of education: Not on file   Highest education level: Not on file  Occupational History   Not on file  Tobacco Use   Smoking status: Every Day    Current packs/day: 1.00    Average packs/day: 1 pack/day for 50.0 years (50.0 ttl pk-yrs)    Types: Cigarettes   Smokeless tobacco: Never  Vaping Use   Vaping status: Never Used  Substance and Sexual Activity   Alcohol use: No   Drug use: No   Sexual activity: Not on file  Other Topics Concern   Not on file  Social History Narrative   Not on file   Social Drivers of Health   Financial Resource Strain: Not on file  Food Insecurity: Not on file  Transportation Needs: Not on file  Physical Activity: Not on file  Stress: Not on file  Social Connections: Not on file  Intimate Partner Violence: Not on file     REVIEW OF SYSTEMS:   [X]  denotes positive finding, [ ]  denotes negative finding Cardiac  Comments:  Chest pain or chest pressure:    Shortness of breath  upon  exertion:    Short of breath when lying flat:    Irregular heart rhythm:        Vascular    Pain in calf, thigh, or hip brought on by ambulation:    Pain in feet at night that wakes you up from your sleep:     Blood clot in your veins:    Leg swelling:         Pulmonary    Oxygen at home:    Productive cough:     Wheezing:         Neurologic    Sudden weakness in arms or legs:     Sudden numbness in arms or legs:     Sudden onset of difficulty speaking or slurred speech:    Temporary loss of vision in one eye:     Problems with dizziness:         Gastrointestinal    Blood in stool:     Vomited blood:         Genitourinary    Burning when urinating:     Blood in urine:        Psychiatric    Major depression:         Hematologic    Bleeding problems:    Problems with blood clotting too easily:        Skin    Rashes or ulcers:        Constitutional    Fever or chills:      PHYSICAL EXAMINATION:  Today's Vitals   02/21/24 0952  BP: 123/82  Pulse: 78  Temp: 98.5 F (36.9 C)  TempSrc: Temporal  SpO2: 91%  Weight: 185 lb 8 oz (84.1 kg)  Height: 6' (1.829 m)  PainSc: 8   PainLoc: Abdomen   Body mass index is 25.16 kg/m.   General:  WDWN in NAD; vital signs documented above Gait: Not observed HENT: WNL, normocephalic Pulmonary: normal non-labored breathing  Cardiac: regular HR;  without carotid bruits Abdomen: soft, mild tenderness with palpation around hernia site; aortic pulse is not palpable Skin: without rashes Vascular Exam/Pulses:  Right Left  Radial 2+ (normal) 2+ (normal)  Femoral 2+ (normal) 2+ (normal)  Popliteal Unable to palpate Unable to palpate  DP 2+ (normal) 2+ (normal)   Extremities: without open wounds Musculoskeletal: no muscle wasting or atrophy  Neurologic: A&O X 3 Psychiatric:  The pt has Normal affect.   Non-Invasive Vascular Imaging:   EVAR Arterial duplex on 02/21/2024: Endovascular Aortic Repair (EVAR):   +----------+----------------+-------------------+--------------+           Diameter AP (cm)Velocities (cm/sec)Comments        +----------+----------------+-------------------+--------------+  Aorta    3.53            109                                +----------+----------------+-------------------+--------------+  Right Limb                                   Not visualized  +----------+----------------+-------------------+--------------+  Left Limb                                    Not visualized  +----------+----------------+-------------------+--------------+   Previous EVAR arterial duplex on 01/18/2023: Endovascular Aortic  Repair (EVAR):  +----------+----------------+-------------------+-------------------+           Diameter AP (cm)Diameter Trans (cm)Velocities (cm/sec)  +----------+----------------+-------------------+-------------------+  Aorta    3.37                                                    +----------+----------------+-------------------+-------------------+  Right Limb1.47            1.61                                    +----------+----------------+-------------------+-------------------+  Left Limb 2.29            2.47               30                   +----------+----------------+-------------------+-------------------+   Summary:  Abdominal Aorta: The largest aortic diameter has decreased compared to prior exam. Previous diameter measurement was 3.9 cm obtained on  01/05/2022.    ASSESSMENT/PLAN:: 73 y.o. male here with hx of EVAR on  07/20/2014 by Dr. Charlotte Cookey.  Hx AAA repair -pt duplex today revealed diameter of 3.5cm, which is increased from 3.4cm at last visit.  This is essentially unchanged.  Pt's abdominal pain is unchanged.  He did have some periumbilical pain last night that was relieved with BM.  He feels this was due to something that he ate.  Discussed that is this continues or worsens, he should be  evaluated.  -continue asa/statin -pt will f/u in one year with EVAR duplex.  Current smoker -discussed importance of smoking cessation and that smoking puts him at increased risk for limb loss, heart attack, stroke.  He has had a very hard time trying to quit.  We discussed finding a different habit to do when he wants a cigarette.  He will work on this.    Maryanna Smart, [facility] [phone]  Clinic MD:   Susi Eric on call MD

## 2024-02-25 DIAGNOSIS — R21 Rash and other nonspecific skin eruption: Secondary | ICD-10-CM | POA: Diagnosis not present

## 2024-02-25 DIAGNOSIS — Z6825 Body mass index (BMI) 25.0-25.9, adult: Secondary | ICD-10-CM | POA: Diagnosis not present

## 2024-02-25 DIAGNOSIS — S90862A Insect bite (nonvenomous), left foot, initial encounter: Secondary | ICD-10-CM | POA: Diagnosis not present

## 2024-06-27 DIAGNOSIS — Z23 Encounter for immunization: Secondary | ICD-10-CM | POA: Diagnosis not present

## 2024-07-25 DIAGNOSIS — Z23 Encounter for immunization: Secondary | ICD-10-CM | POA: Diagnosis not present
